# Patient Record
Sex: Male | Born: 1975 | Race: White | Hispanic: No | Marital: Married | Smoking: Never smoker
Health system: Southern US, Community
[De-identification: ages and names within clinical notes are randomized; demographics above are authoritative.]

## PROBLEM LIST (undated history)

## (undated) DIAGNOSIS — Z872 Personal history of diseases of the skin and subcutaneous tissue: Secondary | ICD-10-CM

## (undated) DIAGNOSIS — E118 Type 2 diabetes mellitus with unspecified complications: Secondary | ICD-10-CM

## (undated) DIAGNOSIS — Z9989 Dependence on other enabling machines and devices: Secondary | ICD-10-CM

## (undated) DIAGNOSIS — E669 Obesity, unspecified: Secondary | ICD-10-CM

## (undated) DIAGNOSIS — G4733 Obstructive sleep apnea (adult) (pediatric): Secondary | ICD-10-CM

## (undated) DIAGNOSIS — E785 Hyperlipidemia, unspecified: Secondary | ICD-10-CM

## (undated) DIAGNOSIS — I639 Cerebral infarction, unspecified: Secondary | ICD-10-CM

## (undated) DIAGNOSIS — G4730 Sleep apnea, unspecified: Secondary | ICD-10-CM

## (undated) DIAGNOSIS — E119 Type 2 diabetes mellitus without complications: Secondary | ICD-10-CM

## (undated) HISTORY — DX: Personal history of diseases of the skin and subcutaneous tissue: Z87.2

## (undated) HISTORY — DX: Obstructive sleep apnea (adult) (pediatric): G47.33

## (undated) HISTORY — DX: Obesity, unspecified: E66.9

## (undated) HISTORY — DX: Sleep apnea, unspecified: G47.30

## (undated) HISTORY — DX: Type 2 diabetes mellitus with unspecified complications: E11.8

## (undated) HISTORY — DX: Obstructive sleep apnea (adult) (pediatric): Z99.89

## (undated) HISTORY — DX: Type 2 diabetes mellitus without complications: E11.9

## (undated) HISTORY — DX: Hyperlipidemia, unspecified: E78.5

## (undated) HISTORY — DX: Cerebral infarction, unspecified: I63.9

---

## 1998-04-08 ENCOUNTER — Encounter: Payer: Self-pay | Admitting: Gastroenterology

## 1998-09-19 ENCOUNTER — Encounter: Admission: RE | Admit: 1998-09-19 | Discharge: 1998-10-11 | Payer: Self-pay | Admitting: Internal Medicine

## 2003-08-11 HISTORY — PX: OTHER SURGICAL HISTORY: SHX169

## 2003-09-06 ENCOUNTER — Encounter (INDEPENDENT_AMBULATORY_CARE_PROVIDER_SITE_OTHER): Payer: Self-pay | Admitting: *Deleted

## 2003-09-06 ENCOUNTER — Ambulatory Visit (HOSPITAL_BASED_OUTPATIENT_CLINIC_OR_DEPARTMENT_OTHER): Admission: RE | Admit: 2003-09-06 | Discharge: 2003-09-06 | Payer: Self-pay | Admitting: Otolaryngology

## 2003-09-06 ENCOUNTER — Ambulatory Visit (HOSPITAL_COMMUNITY): Admission: RE | Admit: 2003-09-06 | Discharge: 2003-09-06 | Payer: Self-pay | Admitting: Otolaryngology

## 2004-06-13 ENCOUNTER — Ambulatory Visit: Payer: Self-pay | Admitting: Family Medicine

## 2004-06-19 ENCOUNTER — Ambulatory Visit: Payer: Self-pay | Admitting: Family Medicine

## 2005-05-07 ENCOUNTER — Ambulatory Visit: Payer: Self-pay | Admitting: Family Medicine

## 2005-05-16 ENCOUNTER — Ambulatory Visit: Payer: Self-pay | Admitting: Family Medicine

## 2005-10-10 ENCOUNTER — Ambulatory Visit: Payer: Self-pay | Admitting: Family Medicine

## 2005-10-11 ENCOUNTER — Ambulatory Visit: Payer: Self-pay | Admitting: Family Medicine

## 2005-10-16 ENCOUNTER — Ambulatory Visit: Payer: Self-pay | Admitting: Cardiology

## 2005-10-23 ENCOUNTER — Ambulatory Visit: Payer: Self-pay

## 2006-05-03 ENCOUNTER — Ambulatory Visit: Payer: Self-pay | Admitting: Family Medicine

## 2006-05-06 ENCOUNTER — Ambulatory Visit: Payer: Self-pay

## 2006-08-16 ENCOUNTER — Ambulatory Visit: Payer: Self-pay | Admitting: Family Medicine

## 2006-08-16 DIAGNOSIS — R5383 Other fatigue: Secondary | ICD-10-CM

## 2006-08-16 DIAGNOSIS — R5381 Other malaise: Secondary | ICD-10-CM

## 2006-08-19 LAB — CONVERTED CEMR LAB
Alkaline Phosphatase: 63 units/L (ref 39–117)
BUN: 13 mg/dL (ref 6–23)
Basophils Absolute: 0 10*3/uL (ref 0.0–0.1)
Bilirubin, Direct: 0.1 mg/dL (ref 0.0–0.3)
CO2: 30 meq/L (ref 19–32)
Cholesterol: 271 mg/dL (ref 0–200)
Eosinophils Absolute: 0.1 10*3/uL (ref 0.0–0.6)
GFR calc Af Amer: 145 mL/min
GFR calc non Af Amer: 120 mL/min
HCT: 48.5 % (ref 39.0–52.0)
HDL: 37.1 mg/dL — ABNORMAL LOW (ref >39.0)
MCHC: 34.9 g/dL (ref 30.0–36.0)
MCV: 95.5 fL (ref 78.0–100.0)
Monocytes Relative: 4.8 % (ref 3.0–11.0)
Neutrophils Relative %: 60.5 % (ref 43.0–77.0)
Platelets: 262 10*3/uL (ref 150–400)
Potassium: 4.3 meq/L (ref 3.5–5.1)
RBC: 5.08 M/uL (ref 4.22–5.81)
RDW: 11.9 % (ref 11.5–14.6)
TSH: 2.13 microintl units/mL (ref 0.35–5.50)
Total CHOL/HDL Ratio: 7.3
Total Protein: 7.2 g/dL (ref 6.0–8.3)
Triglycerides: 198 mg/dL — ABNORMAL HIGH (ref 0–149)
Vitamin B-12: 378 pg/mL (ref 211–911)

## 2006-08-22 ENCOUNTER — Ambulatory Visit: Payer: Self-pay | Admitting: Family Medicine

## 2006-08-22 DIAGNOSIS — E7800 Pure hypercholesterolemia, unspecified: Secondary | ICD-10-CM

## 2006-08-22 DIAGNOSIS — E1169 Type 2 diabetes mellitus with other specified complication: Secondary | ICD-10-CM

## 2006-08-22 HISTORY — DX: Type 2 diabetes mellitus with other specified complication: E11.69

## 2006-11-18 ENCOUNTER — Ambulatory Visit: Payer: Self-pay | Admitting: Family Medicine

## 2006-11-20 LAB — CONVERTED CEMR LAB
Cholesterol: 190 mg/dL (ref 0–200)
HDL: 33.5 mg/dL — ABNORMAL LOW (ref >39.0)

## 2006-11-22 ENCOUNTER — Telehealth (INDEPENDENT_AMBULATORY_CARE_PROVIDER_SITE_OTHER): Payer: Self-pay | Admitting: Internal Medicine

## 2007-01-24 ENCOUNTER — Ambulatory Visit: Payer: Self-pay | Admitting: Family Medicine

## 2007-01-24 DIAGNOSIS — E669 Obesity, unspecified: Secondary | ICD-10-CM | POA: Insufficient documentation

## 2007-04-02 ENCOUNTER — Ambulatory Visit: Payer: Self-pay | Admitting: Family Medicine

## 2007-04-14 ENCOUNTER — Ambulatory Visit: Payer: Self-pay | Admitting: Family Medicine

## 2007-04-14 DIAGNOSIS — G4730 Sleep apnea, unspecified: Secondary | ICD-10-CM | POA: Insufficient documentation

## 2007-04-24 ENCOUNTER — Ambulatory Visit: Payer: Self-pay | Admitting: Pulmonary Disease

## 2007-05-10 ENCOUNTER — Ambulatory Visit (HOSPITAL_BASED_OUTPATIENT_CLINIC_OR_DEPARTMENT_OTHER): Admission: RE | Admit: 2007-05-10 | Discharge: 2007-05-10 | Payer: Self-pay | Admitting: Pulmonary Disease

## 2007-05-10 ENCOUNTER — Encounter: Payer: Self-pay | Admitting: Pulmonary Disease

## 2007-05-16 ENCOUNTER — Ambulatory Visit: Payer: Self-pay | Admitting: Pulmonary Disease

## 2007-05-21 ENCOUNTER — Telehealth (INDEPENDENT_AMBULATORY_CARE_PROVIDER_SITE_OTHER): Payer: Self-pay | Admitting: *Deleted

## 2007-05-28 ENCOUNTER — Ambulatory Visit: Payer: Self-pay | Admitting: Pulmonary Disease

## 2007-06-27 ENCOUNTER — Ambulatory Visit: Payer: Self-pay | Admitting: Pulmonary Disease

## 2007-08-02 ENCOUNTER — Encounter: Payer: Self-pay | Admitting: Pulmonary Disease

## 2007-09-23 ENCOUNTER — Ambulatory Visit: Payer: Self-pay | Admitting: Family Medicine

## 2007-09-29 ENCOUNTER — Ambulatory Visit: Payer: Self-pay | Admitting: Internal Medicine

## 2007-09-30 LAB — CONVERTED CEMR LAB
BUN: 16 mg/dL (ref 6–23)
Calcium: 9.1 mg/dL (ref 8.4–10.5)
Cholesterol: 146 mg/dL (ref 0–200)
Creatinine, Ser: 0.9 mg/dL (ref 0.4–1.5)
GFR calc Af Amer: 125 mL/min
GFR calc non Af Amer: 103 mL/min
LDL Cholesterol: 99 mg/dL (ref 0–99)
Total CHOL/HDL Ratio: 4.7
Triglycerides: 81 mg/dL (ref 0–149)
VLDL: 16 mg/dL (ref 0–40)

## 2007-10-01 ENCOUNTER — Ambulatory Visit: Payer: Self-pay | Admitting: Family Medicine

## 2007-10-20 ENCOUNTER — Telehealth (INDEPENDENT_AMBULATORY_CARE_PROVIDER_SITE_OTHER): Payer: Self-pay | Admitting: Internal Medicine

## 2007-11-10 ENCOUNTER — Encounter (INDEPENDENT_AMBULATORY_CARE_PROVIDER_SITE_OTHER): Payer: Self-pay | Admitting: Internal Medicine

## 2007-12-09 ENCOUNTER — Ambulatory Visit: Payer: Self-pay | Admitting: Family Medicine

## 2007-12-10 ENCOUNTER — Encounter: Payer: Self-pay | Admitting: Family Medicine

## 2007-12-17 ENCOUNTER — Telehealth: Payer: Self-pay | Admitting: Internal Medicine

## 2007-12-18 ENCOUNTER — Ambulatory Visit: Payer: Self-pay | Admitting: Family Medicine

## 2007-12-26 ENCOUNTER — Ambulatory Visit: Payer: Self-pay | Admitting: Pulmonary Disease

## 2008-01-02 ENCOUNTER — Ambulatory Visit: Payer: Self-pay | Admitting: Family Medicine

## 2008-01-02 ENCOUNTER — Encounter (INDEPENDENT_AMBULATORY_CARE_PROVIDER_SITE_OTHER): Payer: Self-pay | Admitting: Internal Medicine

## 2008-05-25 ENCOUNTER — Ambulatory Visit: Payer: Self-pay | Admitting: Internal Medicine

## 2008-05-26 ENCOUNTER — Telehealth (INDEPENDENT_AMBULATORY_CARE_PROVIDER_SITE_OTHER): Payer: Self-pay | Admitting: Internal Medicine

## 2008-05-27 ENCOUNTER — Encounter (INDEPENDENT_AMBULATORY_CARE_PROVIDER_SITE_OTHER): Payer: Self-pay | Admitting: Internal Medicine

## 2008-05-31 ENCOUNTER — Telehealth (INDEPENDENT_AMBULATORY_CARE_PROVIDER_SITE_OTHER): Payer: Self-pay | Admitting: Internal Medicine

## 2008-05-31 LAB — CONVERTED CEMR LAB
BUN: 17 mg/dL (ref 6–23)
Calcium: 9.1 mg/dL (ref 8.4–10.5)
Creatinine, Ser: 1 mg/dL (ref 0.4–1.5)
GFR calc non Af Amer: 91.08 mL/min (ref >60)
Glucose, Bld: 132 mg/dL — ABNORMAL HIGH (ref 70–99)
HDL: 34.4 mg/dL — ABNORMAL LOW (ref >39.00)
LDL Cholesterol: 117 mg/dL — ABNORMAL HIGH (ref 0–99)
Potassium: 4.4 meq/L (ref 3.5–5.1)
Total CHOL/HDL Ratio: 5
Triglycerides: 103 mg/dL (ref 0.0–149.0)
VLDL: 20.6 mg/dL (ref 0.0–40.0)

## 2008-06-04 DIAGNOSIS — H1045 Other chronic allergic conjunctivitis: Secondary | ICD-10-CM | POA: Insufficient documentation

## 2008-07-07 ENCOUNTER — Ambulatory Visit: Payer: Self-pay | Admitting: Family Medicine

## 2008-07-16 ENCOUNTER — Ambulatory Visit: Payer: Self-pay | Admitting: Family Medicine

## 2008-08-05 ENCOUNTER — Ambulatory Visit: Payer: Self-pay | Admitting: Family Medicine

## 2008-08-06 LAB — CONVERTED CEMR LAB
ALT: 28 units/L (ref 0–53)
HDL: 38.2 mg/dL — ABNORMAL LOW (ref >39.00)
Total CHOL/HDL Ratio: 3

## 2008-08-10 ENCOUNTER — Ambulatory Visit: Payer: Self-pay | Admitting: Gastroenterology

## 2008-12-24 ENCOUNTER — Ambulatory Visit: Payer: Self-pay | Admitting: Pulmonary Disease

## 2009-02-16 ENCOUNTER — Ambulatory Visit: Payer: Self-pay | Admitting: Family Medicine

## 2009-02-18 ENCOUNTER — Telehealth (INDEPENDENT_AMBULATORY_CARE_PROVIDER_SITE_OTHER): Payer: Self-pay | Admitting: Internal Medicine

## 2009-03-01 ENCOUNTER — Telehealth (INDEPENDENT_AMBULATORY_CARE_PROVIDER_SITE_OTHER): Payer: Self-pay | Admitting: Internal Medicine

## 2009-04-25 ENCOUNTER — Ambulatory Visit: Payer: Self-pay | Admitting: Family Medicine

## 2009-04-25 LAB — CONVERTED CEMR LAB: Cholesterol, target level: 200 mg/dL

## 2009-04-27 DIAGNOSIS — E1165 Type 2 diabetes mellitus with hyperglycemia: Secondary | ICD-10-CM

## 2009-04-27 DIAGNOSIS — Z794 Long term (current) use of insulin: Secondary | ICD-10-CM | POA: Insufficient documentation

## 2009-04-27 DIAGNOSIS — E1159 Type 2 diabetes mellitus with other circulatory complications: Secondary | ICD-10-CM | POA: Insufficient documentation

## 2009-04-27 LAB — CONVERTED CEMR LAB
BUN: 20 mg/dL (ref 6–23)
Basophils Absolute: 0.3 10*3/uL — ABNORMAL HIGH (ref 0.0–0.1)
Creatinine,U: 228 mg/dL
Direct LDL: 86 mg/dL
Eosinophils Absolute: 0.1 10*3/uL (ref 0.0–0.7)
GFR calc non Af Amer: 90.58 mL/min (ref >60)
Glucose, Bld: 80 mg/dL (ref 70–99)
HCT: 45.4 % (ref 39.0–52.0)
Lymphs Abs: 2.4 10*3/uL (ref 0.7–4.0)
MCV: 101.9 fL — ABNORMAL HIGH (ref 78.0–100.0)
Microalb, Ur: 0.7 mg/dL (ref 0.0–1.9)
Monocytes Absolute: 0.3 10*3/uL (ref 0.1–1.0)
Platelets: 211 10*3/uL (ref 150.0–400.0)
Potassium: 4.4 meq/L (ref 3.5–5.1)
RDW: 12.2 % (ref 11.5–14.6)
TSH: 2.52 microintl units/mL (ref 0.35–5.50)
Total Bilirubin: 0.7 mg/dL (ref 0.3–1.2)

## 2009-05-18 ENCOUNTER — Encounter: Payer: Self-pay | Admitting: Family Medicine

## 2009-07-27 ENCOUNTER — Ambulatory Visit: Payer: Self-pay | Admitting: Family Medicine

## 2009-07-27 DIAGNOSIS — R002 Palpitations: Secondary | ICD-10-CM | POA: Insufficient documentation

## 2009-09-01 ENCOUNTER — Ambulatory Visit: Payer: Self-pay | Admitting: Cardiology

## 2009-09-01 ENCOUNTER — Ambulatory Visit: Payer: Self-pay

## 2009-11-03 ENCOUNTER — Ambulatory Visit: Payer: Self-pay | Admitting: Family Medicine

## 2009-12-16 ENCOUNTER — Ambulatory Visit: Payer: Self-pay | Admitting: Internal Medicine

## 2009-12-16 DIAGNOSIS — N453 Epididymo-orchitis: Secondary | ICD-10-CM | POA: Insufficient documentation

## 2009-12-16 LAB — CONVERTED CEMR LAB
Nitrite: NEGATIVE
Urobilinogen, UA: 0.2

## 2009-12-23 ENCOUNTER — Telehealth: Payer: Self-pay | Admitting: Family Medicine

## 2009-12-23 ENCOUNTER — Encounter: Admission: RE | Admit: 2009-12-23 | Discharge: 2009-12-23 | Payer: Self-pay | Admitting: Family Medicine

## 2009-12-23 ENCOUNTER — Ambulatory Visit: Payer: Self-pay | Admitting: Family Medicine

## 2009-12-23 DIAGNOSIS — K921 Melena: Secondary | ICD-10-CM

## 2010-04-10 ENCOUNTER — Ambulatory Visit
Admission: RE | Admit: 2010-04-10 | Discharge: 2010-04-10 | Payer: Self-pay | Source: Home / Self Care | Attending: Family Medicine | Admitting: Family Medicine

## 2010-04-10 DIAGNOSIS — J069 Acute upper respiratory infection, unspecified: Secondary | ICD-10-CM | POA: Insufficient documentation

## 2010-04-11 NOTE — Assessment & Plan Note (Signed)
Summary: ESTAB FROM [facility] AND MED REFILL/DLO   Vital Signs:  Patient profile:   35 year old male Height:      63 inches Weight:      188.8 pounds BMI:     33.57 Temp:     98.1 degrees F oral Pulse rate:   84 / minute Pulse rhythm:   regular BP sitting:   100 / 78  (left arm) Cuff size:   regular  Vitals Entered By: Benny Lennert CMA Duncan Dull) (April 25, 2009 4:01 PM)  History of Present Illness: Chief complaint est from [facility] needs med refills  DM  Chol    check tsh  Diabetes Management History:      The patient is a 35 years old male who comes in for evaluation of DM Type 2.  He has not been enrolled in the "Diabetic Education Program".  He states understanding of dietary principles but he is not following the appropriate diet.  No sensory loss is reported.  He is checking home blood sugars.  He says that he is not exercising regularly.        Hypoglycemic symptoms are not occurring.  No hyperglycemic symptoms are reported.        There are no symptoms to suggest diabetic complications.  No changes have been made to his treatment plan since last visit.  Dietary compliance is reported to be fair.    Lipid Management History:      Positive NCEP/ATP III risk factors include diabetes and HDL cholesterol less than 40.  Negative NCEP/ATP III risk factors include male age less than 23 years old, non-tobacco-user status, non-hypertensive, no ASHD (atherosclerotic heart disease), no prior stroke/TIA, no peripheral vascular disease, and no history of aortic aneurysm.        The patient states that he knows about the "Therapeutic Lifestyle Change" diet.  His compliance with the TLC diet is fair.    Preventive Screening-Counseling & Management  Caffeine-Diet-Exercise     Does Patient Exercise: no  Current Problems (verified): 1)  Diabetes Mellitus, Type II  (ICD-250.00) 2)  Conjunctivitis, Allergic  (ICD-372.14) 3)  Sleep Apnea  (ICD-780.57) 4)  Obesity  (ICD-278.00) 5)   Hypercholesterolemia, Pure  (ICD-272.0) 6)  Fatigue  (ICD-780.79) 7)  Well Adult  (ICD-V70.0)  Allergies: 1)  ! Sulfa 2)  ! Sudafed 3)  ! Metformin Hcl 4)  ! Septra  Past History:  Past medical, surgical, family and social histories (including risk factors) reviewed, and no changes noted (except as noted below).  Past Medical History: dyslipidemia history of rosacea  SLEEP APNEA (ICD-780.57) OBESITY (ICD-278.00) Diabetes mellitus, type II  Past Surgical History: Reviewed history from 06/04/2008 and no changes required. nasoplasty 08/2003  Family History: Reviewed history from 08/10/2008 and no changes required. history of heart disease in his mother and father No FH of Colon Cancer:  Social History: Reviewed history from 08/10/2008 and no changes required. 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  Alcohol Use - yes  occasional Daily Caffeine Use  2-3 cups per day Does Patient Exercise:  no  Review of Systems       REVIEW OF SYSTEMS GEN: No acute illnesses, no fever, chills, sweats. CV: No chest pain or SOB GI: No noted N or V Otherwise, pertinent positives and negatives are noted in the HPI.   Physical Exam  Additional Exam:  GEN: WDWN, NAD,  Non-toxic, A & O x 3 HEENT: Atraumatic, Normocephalic. Neck supple. No masses, No LAD. Ears and Nose: No external deformity. CV: RRR, No M/G/R. No JVD. No thrill. No extra heart sounds. PULM: CTA B, no wheezes, crackles, rhonchi. No retractions. No resp. distress. No accessory muscle use. EXTR: No c/c/e NEURO: Normal gait.  PSYCH: Normally interactive. Conversant. Not depressed or anxious appearing.  Calm demeanor.    Diabetes Management Exam:    Foot Exam (with socks and/or shoes not present):       Sensory-Pinprick/Light touch:          Left medial foot (L-4):  normal          Left dorsal foot (L-5): normal          Left lateral foot (S-1): normal          Right medial foot (L-4): normal          Right dorsal foot (L-5): normal          Right lateral foot (S-1): normal       Sensory-Monofilament:          Left foot: normal          Right foot: normal       Inspection:          Left foot: normal          Right foot: normal       Nails:          Left foot: normal          Right foot: normal   Impression & Recommendations:  Problem # 1:  DIABETES MELLITUS, TYPE II (ICD-250.00)  His updated medication list for this problem includes:    Adult Aspirin Low Strength 81 Mg Tbdp (Aspirin) .Marland Kitchen... 1 by mouth daily    Actos 30 Mg Tabs (Pioglitazone hcl) .Marland Kitchen... 1 once daily for diabetes controll by mouth  Labs Reviewed: Creat: 1.0 (05/25/2008)    Reviewed HgBA1c results: 5.8 (08/05/2008)  6.5 (05/25/2008)  Problem # 2:  HYPERCHOLESTEROLEMIA, PURE (ICD-272.0)  His updated medication list for this problem includes:    Simvastatin 40 Mg Tabs (Simvastatin) .Marland Kitchen... 1 each day for elevated cholesterol  Orders: Venipuncture ([address]) TLB-Cholesterol, Direct LDL (83721-DIRLDL)  Labs Reviewed: SGOT: 20 (08/05/2008)   SGPT: 28 (08/05/2008)  Lipid Goals: Chol Goal: 200 (04/25/2009)   HDL Goal: 40 (04/25/2009)   LDL Goal: 100 (04/25/2009)   TG Goal: 150 (04/25/2009)  10 Yr Risk Heart Disease: 2 %   HDL:38.20 (08/05/2008), 34.40 (05/25/2008)  LDL:67 (08/05/2008), 117 (60/45/4098)  Chol:118 (08/05/2008), 172 (05/25/2008)  Trig:62.0 (08/05/2008), 103.0 (05/25/2008)  Complete Medication List: 1)  Adult Aspirin Low Strength 81 Mg Tbdp (Aspirin) .Marland Kitchen.. 1 by mouth daily 2)  Freestyle Test Strp (Glucose blood) .... Check twice a day  until blood sugar stablizied 3)  Simvastatin 40 Mg Tabs (Simvastatin) .Marland Kitchen.. 1 each day for elevated cholesterol 4)  Actos 30 Mg Tabs (Pioglitazone hcl) .Marland Kitchen.. 1 once daily for diabetes controll by mouth 5)  Metrogel 1 % Gel  (Metronidazole) .... Use as directed  Other Orders: TLB-BMP (Basic Metabolic Panel-BMET) (80048-METABOL) TLB-CBC Platelet - w/Differential (85025-CBCD) TLB-Hepatic/Liver Function Pnl (80076-HEPATIC) TLB-TSH (Thyroid Stimulating Hormone) (84443-TSH) TLB-A1C / Hgb A1C (Glycohemoglobin) (83036-A1C) TLB-Microalbumin/Creat Ratio, Urine (82043-MALB)  Diabetes Management Assessment/Plan:      The following lipid goals have been established for the patient: Total cholesterol goal of 200; LDL cholesterol goal of 100; HDL cholesterol goal of 40; Triglyceride goal of 150.  Lipid Assessment/Plan:      Based on NCEP/ATP III, the patient's risk factor category is "history of diabetes".  From this information, the patient's calculated lipid goals are as follows: Total cholesterol goal is 200; LDL cholesterol goal is 100; HDL cholesterol goal is 40; Triglyceride goal is 150.    Patient Instructions: 1)  f/u 6 months 2)  cpx, 30 min 3)  HgbA1c = 250.00 several days before Prescriptions: ACTOS 30 MG TABS (PIOGLITAZONE HCL) 1 once daily for diabetes controll by mouth  #30 x 11   Entered and Authorized by:   Hannah Beat MD   Signed by:   Hannah Beat MD on 04/25/2009   Method used:   [facility]        [facility] [id number]       [address]       [place], [place]  [address]       Ph: [phone] or [phone]       Fax: [phone]   RxID:   [id number] SIMVASTATIN 40 MG TABS (SIMVASTATIN) 1 each day for elevated cholesterol  #30 Tablet x 11   Entered and Authorized by:   Hannah Beat MD   Signed by:   Hannah Beat MD on 04/25/2009   Method used:   [facility]        [facility] [id number]       [address]       [place], [place]  [address]       Ph: [phone] or [phone]       Fax: [phone]   RxID:   [id number] FREESTYLE TEST  STRP (GLUCOSE BLOOD) CHECK TWICE A DAY  UNTIL BLOOD SUGAR STABLIZIED  #100 x 11    Entered and Authorized by:   Hannah Beat MD   Signed by:   Hannah Beat MD on 04/25/2009   Method used:   [facility]        [facility] (retail)       [address]       [place], [place]  [address]       Ph: [phone] or [phone]       Fax: [phone]   RxID:   [id number]   Current Allergies (reviewed today): ! SULFA ! SUDAFED ! METFORMIN HCL ! SEPTRA

## 2010-04-11 NOTE — Assessment & Plan Note (Signed)
Summary: UTI/DLO   Vital Signs:  Patient profile:   35 year old male Weight:      203.25 pounds Temp:     98.4 degrees F oral Pulse rate:   72 / minute Pulse rhythm:   regular BP sitting:   122 / 80  (left arm) Cuff size:   large  Vitals Entered By: Selena Batten Dance CMA Duncan Dull) (December 16, 2009 9:32 AM) CC: Epididymitis vs UTI   History of Present Illness: CC: ? epididymitis vs UTI  Pt with h/o T2DM.  Last weekend at beach with L testicular pain, swelling.  Also with dysuria, frequency, urgency.  Seen at [facility], dx with epididymitis and treated with cipro and 1gm azithro.  felt better for 2 days, swelling decreased, now sxs seem to be returning.  Swelling remains down.  Currently dull achey 2-3/10 L testicle.  Previously up to 4-5/10.  UCx at [facility] without growth.  No recent bike or motorcycle rides.  No h/o STIs, currently sexually active in monogamous relationship.  No current dysuria, no frequency, no urgency, stream fine.  No abd pain, n/v/f/c.    + h/o L abd pain 10 years ago s/p colonoscopy, normal.  Current Medications (verified): 1)  Adult Aspirin Low Strength 81 Mg  Tbdp (Aspirin) .Marland Kitchen.. 1 By Mouth Daily 2)  Freestyle Lite Test  Strp (Glucose Blood) .... Check Blood Sugar Twice A Day Until Stabilized 3)  Simvastatin 40 Mg Tabs (Simvastatin) .Marland Kitchen.. 1 Each Day For Elevated Cholesterol 4)  Actos 30 Mg Tabs (Pioglitazone Hcl) .Marland Kitchen.. 1 Once Daily For Diabetes Controll By Mouth 5)  Metrogel 1 % Gel (Metronidazole) .... Use As Directed 6)  Cipro 500 Mg Tabs (Ciprofloxacin Hcl) .... As Directed  Allergies: 1)  ! Sulfa 2)  ! Sudafed 3)  ! Metformin Hcl 4)  ! 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  Alcohol Use - yes  occasional Daily Caffeine Use  2-3 cups per day PMH-FH-SH reviewed for relevance  Review of Systems       per HPI  Physical Exam  General:  Well-developed,well-nourished,in no acute distress; alert,appropriate and cooperative throughout examination Abdomen:  Bowel sounds positive,abdomen soft and non-tender without masses, organomegaly or hernias noted.  no CVA tenderness Genitalia:  Testes bilaterally descended without nodularity, tenderness or masses. slight epididymal swelling L side, no tenderness to palpation.  No penis lesions or urethral discharge.   Impression & Recommendations:  Problem # 1:  EPIDIDYMITIS, LEFT (ICD-604.90) UA clear today.  ? if just needs more time to get back to normal.  rec complete cipro course.  if not better by next week, consider testicular ultrasound to eval spermatocele/hydrocele.  if worse, to seek care sooner.  also recommend NSAIDs.  Complete Medication List: 1)  Adult Aspirin Low Strength 81 Mg Tbdp (Aspirin) .Marland Kitchen.. 1 by mouth daily 2)  Freestyle Lite Test Strp (Glucose blood) .... Check blood sugar twice a day until stabilized 3)  Simvastatin 40 Mg Tabs (Simvastatin) .Marland Kitchen.. 1 each day for elevated cholesterol 4)  Actos 30 Mg Tabs (Pioglitazone hcl) .Marland Kitchen.. 1 once daily for diabetes controll by mouth 5)  Metrogel 1 % Gel (Metronidazole) .... Use as directed 6)  Cipro 500 Mg  Tabs (Ciprofloxacin hcl) .... As directed  Patient Instructions: 1)  Finish course of antibiotic.  if better by weekend, we will just watch for now.  If not improving or worsening, we may treat you with another course of antibiotics.   2)  Give [place] a call on Monday with an update. 3)  Good to meet you today.  Call clinic with questions.  Current Allergies (reviewed today): ! SULFA ! SUDAFED ! METFORMIN HCL ! SEPTRA  Laboratory Results   Urine Tests  Date/Time  Received: December 16, 2009 9:40 AM Date/Time Reported: December 16, 2009 9:40 AM  Routine Urinalysis   Color: yellow Appearance: Clear Glucose: negative   (Normal Range: Negative) Bilirubin: negative   (Normal Range: Negative) Ketone: negative   (Normal Range: Negative) Spec. Gravity: 1.025   (Normal Range: 1.003-1.035) Blood: negative   (Normal Range: Negative) pH: 6.0   (Normal Range: 5.0-8.0) Protein: negative   (Normal Range: Negative) Urobilinogen: 0.2   (Normal Range: 0-1) Nitrite: negative   (Normal Range: Negative) Leukocyte Esterace: negative   (Normal Range: Negative)

## 2010-04-11 NOTE — Miscellaneous (Signed)
Summary: med list update/ test strips called in  Clinical Lists Changes  Medications: Changed medication from FREESTYLE TEST  STRP (GLUCOSE BLOOD) CHECK TWICE A DAY  UNTIL BLOOD SUGAR STABLIZIED to FREESTYLE LITE TEST  STRP (GLUCOSE BLOOD) Check blood sugar twice a day until stabilized - Signed Rx of FREESTYLE LITE TEST  STRP (GLUCOSE BLOOD) Check blood sugar twice a day until stabilized;  #100 x 3;  Signed;  Entered by: Lowella Petties CMA;  Authorized by: Hannah Beat MD;  Method used: [facility], [place], [place]  [address], Ph: [phone] or [phone], Fax: [phone]    Prescriptions: FREESTYLE LITE TEST  STRP (GLUCOSE BLOOD) Check blood sugar twice a day until stabilized  #100 x 3   Entered by:   Lowella Petties CMA   Authorized by:   Hannah Beat MD   Signed by:   Lowella Petties CMA on 05/18/2009   Method used:   [facility]        [facility] [id number]       [address]       [place], [place]  [address]       Ph: [phone] or [phone]       Fax: [phone]   RxID:   [id number]   Prior Medications: ADULT ASPIRIN LOW STRENGTH 81 MG  TBDP (ASPIRIN) 1 by mouth daily SIMVASTATIN 40 MG TABS (SIMVASTATIN) 1 each day for elevated cholesterol ACTOS 30 MG TABS (PIOGLITAZONE HCL) 1 once daily for diabetes controll by mouth METROGEL 1 % GEL (METRONIDAZOLE) use as directed Current Allergies: ! SULFA ! SUDAFED ! METFORMIN HCL ! SEPTRA

## 2010-04-11 NOTE — Assessment & Plan Note (Signed)
Summary: TETENUS/Dama Hedgepeth/CLE  Nurse Visit   Allergies: 1)  ! Sulfa 2)  ! Sudafed 3)  ! Metformin Hcl 4)  ! Septra  Immunizations Administered:  Tetanus Vaccine:    Vaccine Type: Tdap    Site: right deltoid    Mfr: GlaxoSmithKline    Dose: 0.5 ml    Route: IM    Given by: Linde Gillis CMA (AAMA)    Exp. Date: 12/30/2011    Lot #: [id number]    VIS given: 01/28/07 version given November 03, 2009.  Orders Added: 1)  Tdap => 18yrs IM [90715] 2)  Admin 1st Vaccine [56433]

## 2010-04-11 NOTE — Assessment & Plan Note (Signed)
Summary: 3:00  ST,EAR,COUGH/CLE   Vital Signs:  Patient profile:   35 year old male Height:      63 inches Weight:      189.8 pounds BMI:     33.74 Temp:     98.3 degrees F oral Pulse rate:   84 / minute Pulse rhythm:   regular BP sitting:   112 / 80  (left arm) Cuff size:   regular  Vitals Entered By: Benny Lennert CMA Duncan Dull) (Jul 27, 2009 2:57 PM)  History of Present Illness: Chief complaint sore throat,ears, cough  35 year old male:  1. URI: Fever last friday last thursday - mainly with sore throat, then friday monning started to run a fever fever broke on saturday morning coughing and has kept a cough.   2. palpitations: intermittently has had a racing heart beat that will last 10 minutes or so. Has not happened in the last 3 weeks, but was happeneing a few times a week. No active CP or SOB.         Allergies: 1)  ! Sulfa 2)  ! Sudafed 3)  ! Metformin Hcl 4)  ! Septra  Past History:  Past medical, surgical, family and social histories (including risk factors) reviewed, and no changes noted (except as noted below).  Past Medical History: Reviewed history from 04/25/2009 and no changes required. dyslipidemia history of rosacea  SLEEP APNEA (ICD-780.57) OBESITY (ICD-278.00) Diabetes mellitus, type II  Past Surgical History: Reviewed history from 06/04/2008 and no changes required. nasoplasty 08/2003  Family History: Reviewed history from 08/10/2008 and no changes required. history of heart disease in his mother and father No FH of Colon Cancer:  Social History: Reviewed history from 08/10/2008 and no changes required. 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  Alcohol Use - yes  occasional Daily Caffeine Use  2-3 cups per day  Review of Systems      See HPI General:  Complains of fever;  denies chills and sweats.  Physical Exam  Additional Exam:  GEN: WDWN, NAD; alert,appropriate and cooperative throughout exam HEENT: Normocephalic and atraumatic. Throat clear, w/o exudate, no LAD, R TM clear, L TM - good landmarks, No fluid present. rhinnorhea.  Left frontal and maxillary sinuses: NT Right frontal and maxillary sinuses: NT NECK: No ant or post LAD CV: RRR, No M/G/R PULM: no resp distress, no accessory muscles.  No retractions. no w/c/r ABD: S,NT,ND,+BS, No HSM EXTR: no c/c/e PSYCH: full affect, pleasant, conversant    Impression & Recommendations:  Problem # 1:  URI (ICD-465.9) Assessment New  His updated medication list for this problem includes:    Adult Aspirin Low Strength 81 Mg Tbdp (Aspirin) .Marland Kitchen... 1 by mouth daily  Instructed on symptomatic treatment. Call if symptoms persist or worsen.   Problem # 2:  PALPITATIONS (ICD-785.1) Assessment: New DIscussed that with rare occurrence, likelihood of capture small on EKG, holter.  Strong FH of CAD, father with multiple interventions, multiple people on both sides of family < 50 with CAD. Patient with dyslipidemia, DM 2, obesity. Will obtain an ETT to eval for CAD, potential inducible arrythmia, and assist with evaluation for exercise tolerance for risk assessment.  Orders: Cardiology Referral (Cardiology)  Complete Medication List: 1)  Adult Aspirin Low Strength 81 Mg Tbdp (Aspirin) .Marland Kitchen.. 1 by mouth daily 2)  Freestyle Lite Test Strp (Glucose blood) .... Check blood sugar twice  a day until stabilized 3)  Simvastatin 40 Mg Tabs (Simvastatin) .Marland Kitchen.. 1 each day for elevated cholesterol 4)  Actos 30 Mg Tabs (Pioglitazone hcl) .Marland Kitchen.. 1 once daily for diabetes controll by mouth 5)  Metrogel 1 % Gel (Metronidazole) .... Use as directed  Patient Instructions: 1)  Referral Appointment Information 2)  Day/Date: 3)  Time: 4)  Place/MD: 5)  Address: 6)  Phone/Fax: 7)  Patient given appointment information.  Information/Orders faxed/mailed.   Current Allergies (reviewed today): ! SULFA ! SUDAFED ! METFORMIN HCL ! SEPTRA

## 2010-04-11 NOTE — Progress Notes (Signed)
Summary: called report on ultrasound  Phone Note From Other Clinic   Caller: [facility] Summary of Call: Called report on scrotal ultrasound- this is already in chart. Initial call taken by: Lowella Petties CMA,  December 23, 2009 3:09 PM  Follow-up for Phone Call        dr Ermalene Searing already addressed.Consuello Masse CMA   Follow-up by: Benny Lennert CMA Duncan Dull),  December 23, 2009 4:14 PM

## 2010-04-11 NOTE — Assessment & Plan Note (Signed)
Summary: ?UTI/CLE   Vital Signs:  Patient profile:   35 year old male Height:      63 inches Weight:      202.0 pounds BMI:     35.91 Temp:     98.6 degrees F oral Pulse rate:   72 / minute Pulse rhythm:   regular BP sitting:   120 / 86  (left arm) Cuff size:   large  Vitals Entered By: Benny Lennert CMA Duncan Dull) (December 23, 2009 12:01 PM)  History of Present Illness: Chief complaint ? UTI  Pt with h/o T2DM.  Seen by Dr. Cecille Amsterdam 1 week ago.  Felt likely epidydimitis.Marland KitchenMarland KitchenTold  to complete cipro as felt may not have been on long enough. Below is the HPI summary from that appt:  2 [place] ago  [place] beach with L testicular pain, swelling.  Also with dysuria, frequency, urgency.  Seen at [facility], dx with epididymitis and treated with cipro and 1gm azithro.  felt better for 2 days, swelling decreased, now sxs seem to be returning.  Swelling remains down.  Currently dull achey 2-3/10 L testicle.  Previously up to 4-5/10. UCx at [facility] without growth.  No recent bike or motorcycle rides.  No h/o STIs, currently sexually active in monogamous relationship.   Over the weekend..he felt it was better until did self exam..epidydimitis felt spongy/swollen. Intermittant pain in left testicle. No current dysuria, no frequency, no urgency, stream fine.  Testicle does hurt  mildly when urinating as well    No n/v/f/c.    + h/o L abd pain 10 years ago s/p colonoscopy, normal. Has appt with GI for 2 months of pain in left abdomen. Pain after large meals. Eats a lot of fat in diet. Some relief with BMs. No benefit from yomax.   Did note blood in stool on about 6 days ago after taking NSAID. Mild discomfort in rectum. No straining. Has history of intermittant constipation..but none recently  Bright red mixed in stool and in water.    Not checking CBGs.Marland Kitchenoverdue for DM appt with Dr. Patsy Lager.     Problems Prior to Update: 1)  Epididymitis, Left  (ICD-604.90) 2)  Palpitations  (ICD-785.1) 3)  Diabetes  Mellitus, Type II  (ICD-250.00) 4)  Conjunctivitis, Allergic  (ICD-372.14) 5)  Sleep Apnea  (ICD-780.57) 6)  Obesity  (ICD-278.00) 7)  Hypercholesterolemia, Pure  (ICD-272.0) 8)  Fatigue  (ICD-780.79) 9)  Well Adult  (ICD-V70.0)  Current Medications (verified): 1)  Adult Aspirin Low Strength 81 Mg  Tbdp (Aspirin) .Marland Kitchen.. 1 By Mouth Daily 2)  Freestyle Lite Test  Strp (Glucose Blood) .... Check Blood Sugar Twice A Day Until Stabilized 3)  Simvastatin 40 Mg Tabs (Simvastatin) .Marland Kitchen.. 1 Each Day For Elevated Cholesterol 4)  Actos 30 Mg Tabs (Pioglitazone Hcl) .Marland Kitchen.. 1 Once Daily For Diabetes Controll By Mouth 5)  Metrogel 1 % Gel (Metronidazole) .... Use As Directed 6)  Doxycycline Hyclate 100 Mg Caps (Doxycycline Hyclate) .... Take One Tablet 2 Times Daily For 10 Days  Allergies: 1)  ! Sulfa 2)  ! Sudafed 3)  ! Metformin Hcl 4)  ! Septra  Past History:  Past medical, surgical, family and social histories (including risk factors) reviewed, and no changes noted (except as noted below).  Past Medical History: Reviewed history from 04/25/2009 and no changes required. dyslipidemia history of rosacea  SLEEP APNEA (ICD-780.57) OBESITY (ICD-278.00) Diabetes mellitus, type II  Past Surgical History: Reviewed history from 06/04/2008 and no changes required. nasoplasty 08/2003  Family  History: Reviewed history from 08/10/2008 and no changes required. history of heart disease in his mother and father No FH of Colon Cancer:  Social History: Reviewed history from 08/10/2008 and no changes required. 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  Alcohol Use - yes  occasional Daily Caffeine Use  2-3 cups per day  Review of Systems General:  Denies fatigue and fever. CV:  Denies chest pain or discomfort. Resp:  Denies shortness of breath. GI:   Complains of abdominal pain; denies bloody stools. MS:  Denies joint pain.  Physical Exam  General:  Well-developed,well-nourished,in no acute distress; alert,appropriate and cooperative throughout examination Mouth:  Oral mucosa and oropharynx without lesions or exudates.  Teeth in good repair. Wallace Cullens PND. Neck:  no carotid bruit or thyromegaly no cervical or supraclavicular lymphadenopathy  Lungs:  Normal respiratory effort, chest expands symmetrically. Lungs are clear to auscultation, no crackles or wheezes. Heart:  Normal rate and regular rhythm. S1 and S2 normal without gallop, murmur, click, rub or other extra sounds. Abdomen:  Bowel sounds positive,abdomen soft and non-tender without masses, organomegaly or hernias noted.  no CVA tenderness Rectal:  stool positive for occult blood and internal hemorrhoid(s). ..currently oozing small blood  Genitalia:  Testes bilaterally descended without nodularity,  or masses. slight epididymal swelling L side,significantly  tenderness to palpation.  No penis lesions or urethral discharge.  No hernia Prostate:  Prostate gland firm and smooth, no enlargement, nodularity, tenderness, mass, asymmetry or induration. Skin:  Intact without suspicious lesions or rashes Psych:  Cognition and judgment appear intact. Alert and cooperative with normal attention span and concentration. No apparent delusions, illusions, hallucinations   Impression & Recommendations:  Problem # 1:  EPIDIDYMITIS, LEFT (ICD-604.90) Given return of pain... repeat course of antibiotics...eval with [place] of testes and epididimitis.  Orders: Radiology Referral (Radiology)  Problem # 2:  DIABETES MELLITUS, TYPE II (ICD-250.00) Overdue for reeval... schedule follow up with primary MD.  His updated medication list for this problem includes:    Adult Aspirin Low Strength 81 Mg Tbdp (Aspirin) .Marland Kitchen... 1 by mouth daily    Actos 30 Mg Tabs (Pioglitazone hcl) .Marland Kitchen... 1 once daily for diabetes  controll by mouth  Problem # 3:  HEMATOCHEZIA (ICD-578.1) Due to internal hemmorhoids.  Increase fiber in diet, water, exercise.  Orders: Anoscopy ([address])  Complete Medication List: 1)  Adult Aspirin Low Strength 81 Mg Tbdp (Aspirin) .Marland Kitchen.. 1 by mouth daily 2)  Freestyle Lite Test Strp (Glucose blood) .... Check blood sugar twice a day until stabilized 3)  Simvastatin 40 Mg Tabs (Simvastatin) .Marland Kitchen.. 1 each day for elevated cholesterol 4)  Actos 30 Mg Tabs (Pioglitazone hcl) .Marland Kitchen.. 1 once daily for diabetes controll by mouth 5)  Metrogel 1 % Gel (Metronidazole) .... Use as directed 6)  Doxycycline Hyclate 100 Mg Caps (Doxycycline hyclate) .... Take one tablet 2 times daily for 10 days  Patient Instructions: 1)  Schedule DM appt with Dr. Patsy Lager in next few weeks. 2)  Fasting labs prior..Dr. Patsy Lager to order.  3)  Referral Appointment Information 4)  Day/Date: 5)  Time: 6)  Place/MD: 7)  Address: 8)  Phone/Fax: 9)  Patient given appointment information. Information/Orders faxed/mailed.  10)   We will call with [place] results.  11)   Increase fiber in diet.  12)   Avoid fatty greasy foods, eat small meals. Prescriptions: DOXYCYCLINE HYCLATE 100 MG CAPS (  DOXYCYCLINE HYCLATE) take one tablet 2 times daily for 10 days  #20 x 0   Entered by:   Benny Lennert CMA (AAMA)   Authorized by:   Kerby Nora MD   Signed by:   Benny Lennert CMA (AAMA) on 12/23/2009   Method used:   Electronically to        [id number] (retail)       [address]       [place], [place]  [address]       Ph: [phone]       Fax: [phone]   RxID:   [id number]   Current Allergies (reviewed today): ! SULFA ! SUDAFED ! METFORMIN HCL ! SEPTRA

## 2010-04-19 NOTE — Assessment & Plan Note (Signed)
Summary: CONGESTION,ST,COUGH,FEVER/CLE   Vital Signs:  Patient profile:   35 year old male Height:      63 inches Weight:      205.25 pounds BMI:     36.49 Temp:     99.0 degrees F oral Pulse rate:   72 / minute Pulse rhythm:   regular BP sitting:   140 / 90  (left arm) Cuff size:   large  Vitals Entered By: Benny Lennert CMA Duncan Dull) (April 10, 2010 2:33 PM)  History of Present Illness: Chief complaint congestion sore throat ,fever and cough  Acute Visit History:      The patient complains of cough, earache, fever, headache, nasal discharge, sinus problems, and sore throat.  These symptoms began 5 days ago.  Other comments include: Uses CPAP at night.        His highest temperature has been 100.9.        The character of the cough is described as nonproductive.  There is no history of wheezing or shortness of breath associated with his cough.        He complains of sinus pressure.        Problems Prior to Update: 1)  Hematochezia  (ICD-578.1) 2)  Hematochezia  (ICD-578.1) 3)  Epididymitis, Left  (ICD-604.90) 4)  Palpitations  (ICD-785.1) 5)  Diabetes Mellitus, Type II  (ICD-250.00) 6)  Conjunctivitis, Allergic  (ICD-372.14) 7)  Sleep Apnea  (ICD-780.57) 8)  Obesity  (ICD-278.00) 9)  Hypercholesterolemia, Pure  (ICD-272.0) 10)  Fatigue  (ICD-780.79) 11)  Well Adult  (ICD-V70.0)  Current Medications (verified): 1)  Adult Aspirin Low Strength 81 Mg  Tbdp (Aspirin) .Marland Kitchen.. 1 By Mouth Daily 2)  Freestyle Lite Test  Strp (Glucose Blood) .... Check Blood Sugar Twice A Day Until Stabilized 3)  Simvastatin 40 Mg Tabs (Simvastatin) .Marland Kitchen.. 1 Each Day For Elevated Cholesterol 4)  Actos 30 Mg Tabs (Pioglitazone Hcl) .Marland Kitchen.. 1 Once Daily For Diabetes Controll By Mouth 5)  Metrogel 1 % Gel (Metronidazole) .... Use As Directed 6)  Amoxicillin 500 Mg Tabs (Amoxicillin) .... 2 Tab By Mouth Two Times A Day  X 10 Days  Void After 04/21/2010  Allergies: 1)  ! Sulfa 2)  ! Sudafed 3)  !  Metformin Hcl 4)  ! Septra  Past History:  Past medical, surgical, family and social histories (including risk factors) reviewed, and no changes noted (except as noted below).  Past Medical History: Reviewed history from 04/25/2009 and no changes required. dyslipidemia history of rosacea  SLEEP APNEA (ICD-780.57) OBESITY (ICD-278.00) Diabetes mellitus, type II  Past Surgical History: Reviewed history from 06/04/2008 and no changes required. nasoplasty 08/2003  Family History: Reviewed history from 08/10/2008 and no changes required. history of heart disease in his mother and father No FH of Colon Cancer:  Social History: Reviewed history from 08/10/2008 and no changes required. 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  Alcohol Use - yes  occasional Daily Caffeine Use  2-3 cups per day  Review of Systems General:  Denies fatigue and fever. CV:  Denies chest pain or discomfort. Resp:  Denies shortness of breath.  Physical Exam  General:  overweight appearin male In NAD  Ears:  clear fluid B TMs Nose:  nasal turbinates red, no mucosal palor  Mouth:  MMM Neck:  no carotid bruit or thyromegaly no cervical or supraclavicular lymphadenopathy  Lungs:  Normal respiratory  effort, chest expands symmetrically. Lungs are clear to auscultation, no crackles or wheezes. Heart:  Normal rate and regular rhythm. S1 and S2 normal without gallop, murmur, click, rub or other extra sounds.   Impression & Recommendations:  Problem # 1:  URI (ICD-465.9) Most likely viral infection.. vs early sinus infection. Treat symptomatically, nasal saline irrigation, mucolytic. If not improving ifn 2-3 days start oral antibitoics. His updated medication list for this problem includes:    Adult Aspirin Low Strength 81 Mg Tbdp (Aspirin) .Marland Kitchen... 1 by  mouth daily  Complete Medication List: 1)  Adult Aspirin Low Strength 81 Mg Tbdp (Aspirin) .Marland Kitchen.. 1 by mouth daily 2)  Freestyle Lite Test Strp (Glucose blood) .... Check blood sugar twice a day until stabilized 3)  Simvastatin 40 Mg Tabs (Simvastatin) .Marland Kitchen.. 1 each day for elevated cholesterol 4)  Actos 30 Mg Tabs (Pioglitazone hcl) .Marland Kitchen.. 1 once daily for diabetes controll by mouth 5)  Metrogel 1 % Gel (Metronidazole) .... Use as directed 6)  Amoxicillin 500 Mg Tabs (Amoxicillin) .... 2 tab by mouth two times a day  x 10 days  void after 04/21/2010  Patient Instructions: 1)  Add nasal saline irrigation 2-3 times a day. 2)   Start mucinex (guafenesin) no decongestant.Marland Kitchen 3)  If not improving in 2-3 days ... or fever not resolving fill antibitoics.  Prescriptions: AMOXICILLIN 500 MG TABS (AMOXICILLIN) 2 tab by mouth two times a day  x 10 days  VOID after 04/21/2010  #40 x 0   Entered and Authorized by:   Kerby Nora MD   Signed by:   Kerby Nora MD on 04/10/2010   Method used:   Print then Give to Patient   RxID:   [id number]    Orders Added: 1)  Est. Patient Level III [95621]    Current Allergies (reviewed today): ! SULFA ! SUDAFED ! METFORMIN HCL ! SEPTRA

## 2010-06-06 ENCOUNTER — Encounter: Payer: Self-pay | Admitting: Family Medicine

## 2010-06-07 ENCOUNTER — Encounter: Payer: Self-pay | Admitting: Family Medicine

## 2010-06-14 ENCOUNTER — Encounter: Payer: Self-pay | Admitting: Family Medicine

## 2010-06-14 ENCOUNTER — Ambulatory Visit (INDEPENDENT_AMBULATORY_CARE_PROVIDER_SITE_OTHER): Payer: BC Managed Care – PPO | Admitting: Family Medicine

## 2010-06-14 DIAGNOSIS — E7800 Pure hypercholesterolemia, unspecified: Secondary | ICD-10-CM

## 2010-06-14 DIAGNOSIS — E119 Type 2 diabetes mellitus without complications: Secondary | ICD-10-CM

## 2010-06-14 DIAGNOSIS — E785 Hyperlipidemia, unspecified: Secondary | ICD-10-CM

## 2010-06-14 DIAGNOSIS — E669 Obesity, unspecified: Secondary | ICD-10-CM

## 2010-06-14 MED ORDER — PIOGLITAZONE HCL 30 MG PO TABS: 30.0000 mg | ORAL_TABLET | Freq: Every day | ORAL | Status: DC

## 2010-06-14 NOTE — Progress Notes (Signed)
35 yo male:  Had a child and wife was put in bedrest for 42 days, then transferred, born 2 months.  8 months.  Diabetes Mellitus: Tolerating Medications: yes Compliance with diet: poor Exercise: none Avg blood sugars at home: 100-210 Foot problems: none Hypoglycemia: none No nausea, vomitting, blurred vision, polyuria. No recent eye exam  Lab Results  Component Value Date   HGBA1C 5.8 04/25/2009      Chemistry      Component Value Date/Time   NA 143 04/25/2009 1626   K 4.4 04/25/2009 1626   CL 109 04/25/2009 1626   CO2 30 04/25/2009 1626   BUN 20 04/25/2009 1626   CREATININE 1.0 04/25/2009 1626      Component Value Date/Time   CALCIUM 9.2 04/25/2009 1626   ALKPHOS 62 04/25/2009 1626   AST 30 04/25/2009 1626   ALT 38 04/25/2009 1626   BILITOT 0.7 04/25/2009 1626      2.  Lipids: Doing well, stable. Tolerating meds fine with no SE. Needs f/u panel  Lab Results  Component Value Date   CHOL 118 08/05/2008   CHOL 172 05/25/2008   CHOL 146 09/29/2007   Lab Results  Component Value Date   HDL 38.20* 08/05/2008   HDL 34.40* 05/25/2008   HDL 30.8* 09/29/2007   Lab Results  Component Value Date   LDLCALC 67 08/05/2008   LDLCALC 117* 05/25/2008   LDLCALC 99 09/29/2007   Lab Results  Component Value Date   TRIG 62.0 08/05/2008   TRIG 103.0 05/25/2008   TRIG 81 09/29/2007   Lab Results  Component Value Date   CHOLHDL 3 08/05/2008   CHOLHDL 5 05/25/2008   CHOLHDL 4.7 CALC 09/29/2007    Lab Results  Component Value Date   ALT 38 04/25/2009   AST 30 04/25/2009   ALKPHOS 62 04/25/2009   BILITOT 0.7 04/25/2009   3. Obesity, cont with trouble with weight, worsened after wife's pregnancy, birth of 3 month old  The PMH, PSH, Social History, Family History, Medications, and allergies have been reviewed in [facility], and have been updated if relevant.  ROS: GEN: No acute illnesses, no fevers, chills. GI: No n/v/d, eating normally Pulm: No SOB Interactive and getting along well at  home.  Otherwise, ROS is as per the HPI.   GEN: WDWN, NAD, Non-toxic, A & O x 3 HEENT: Atraumatic, Normocephalic. Neck supple. No masses, No LAD. Ears and Nose: No external deformity. CV: RRR, No M/G/R. No JVD. No thrill. No extra heart sounds. PULM: CTA B, no wheezes, crackles, rhonchi. No retractions. No resp. distress. No accessory muscle use. ABD: S, NT, ND, +BS. No rebound tenderness. No HSM.  EXTR: No c/c/e NEURO Normal gait.  PSYCH: Normally interactive. Conversant. Not depressed or anxious appearing.  Calm demeanor.   A/P: 1. DM, check a1c, BMP. Cont actos for now We talked about actos.  At this point, there is no clear evidence to direct a change in meds (re: actos).  Med is tolerated (no h/o bladder CA and no h/o blood in urine) and benefit from control of DM2 outweighs other considerations.  Pt agrees to continue actos.   2. Lipids, check LDL. Adjust meds if needed 3. Obesity. Try to eat minimal fatty food and eat more fruits and vegetables. Anything fried is bad, cream, beef and pork fat are bad. Exercise is incredibly important to heart health. Try to exercise for at least 30 minutes 5 - 6 times a week

## 2010-06-15 LAB — BASIC METABOLIC PANEL
BUN: 16 mg/dL (ref 6–23)
CO2: 28 mEq/L (ref 19–32)
Chloride: 104 mEq/L (ref 96–112)
Creatinine, Ser: 0.9 mg/dL (ref 0.4–1.5)
Potassium: 4.3 mEq/L (ref 3.5–5.1)

## 2010-06-15 LAB — LDL CHOLESTEROL, DIRECT: Direct LDL: 119.2 mg/dL

## 2010-06-15 LAB — HEPATIC FUNCTION PANEL
ALT: 50 U/L (ref 0–53)
AST: 32 U/L (ref 0–37)
Alkaline Phosphatase: 73 U/L (ref 39–117)
Total Bilirubin: 0.6 mg/dL (ref 0.3–1.2)

## 2010-06-19 MED ORDER — ATORVASTATIN CALCIUM 80 MG PO TABS: 80.0000 mg | ORAL_TABLET | Freq: Every day | ORAL | Status: DC

## 2010-06-19 NOTE — Progress Notes (Signed)
Addended by: Benny Lennert on: 06/19/2010 08:43 AM   Modules accepted: Orders

## 2010-07-25 NOTE — Procedures (Signed)
NAME:  Tyler Hoffman, Tyler Hoffman NO.:  [id number]   MEDICAL RECORD NO.:  [id number]          PATIENT TYPE:  OUT   LOCATION:  SLEEP CENTER                 FACILITY[facility]  [facility]   PHYSICIAN:  Barbaraann Share, MD,FCCPDATE OF BIRTH:  March 02, 1975   DATE OF STUDY:  05/10/2007                            NOCTURNAL POLYSOMNOGRAM   REFERRING PHYSICIAN:  Barbaraann Share, MD,FCCP   INDICATIONS FOR PROCEDURE:  Hypersomnia with sleep apnea.   RESULTS:  Epward score is 17.   SLEEP ARCHITECTURE:  The patient had a total sleep time of 368 minutes  with very little slow wave sleep or REM. Sleep onset latency was normal  at 23 minutes and REM onset. Sleep efficiency was decreased at 84%.   RESPIRATORY DATA:  The patient was found to have 44 apnea's and 4  hypopnea's for an apnea hypopnea index of 8 events per hour. The patient  was also noted to have 96 respiratory effort related arousals, giving  him a respiratory disturbance index of 24 events per hour. The events  were more frequent in supine REM and there was loud snoring noted  throughout.   OXYGEN DATA:  There was O2 desaturation as low as 80% with his  obstructive events.   CARDIAC DATA:  No clinically significant arrhythmias were noted.   MOVEMENT/PARASOMNIA:  No clinically significant leg jerks or abnormal  behaviors were noted.   IMPRESSION/RECOMMENDATION:  1. Mild obstructive sleep apnea/hypopnea syndrome with an      apnea/hypopnea index of 8 events per hour and O2 desaturation as      low as 80%. The patient also had 96 respiratory effort related      arousals, giving him a respiratory disturbance index of 24 events      per hour. Treatment for this degree of sleep apnea can include      weight loss alone if applicable, upper airway surgery, oral      appliance, and also CPAP. Clinical correlation is suggested.      Barbaraann Share, MD,FCCP  Diplomate, American Board of Sleep  Medicine  Electronically Signed    [facility]/MEDQ  D:  05/16/2007 14:03:46  T:  05/16/2007 20:23:56  Job:  [id number]

## 2010-07-28 NOTE — Op Note (Signed)
Tyler Hoffman, Tyler Hoffman                             ACCOUNT NO.:  [id number]   MEDICAL RECORD NO.:  [id number]                   PATIENT TYPE:  AMB   LOCATION:  [facility]                                  FACILITY:  [facility]   PHYSICIAN:  Jefry H. Pollyann Kennedy, M.D.                DATE OF BIRTH:  02/08/1976   DATE OF PROCEDURE:  09/06/2003  DATE OF DISCHARGE:                                 OPERATIVE REPORT   PREOPERATIVE DIAGNOSES:  1. Nasoseptal deviation.  2. Inferior turbinates hypertrophy.  3. Chronic ethmoid sinusitis.  4. Chronic maxillary sinusitis.   POSTOPERATIVE DIAGNOSES:  1. Nasoseptal deviation.  2. Inferior turbinates hypertrophy.  3. Chronic ethmoid sinusitis.  4. Chronic maxillary sinusitis.   PROCEDURE:  1. Nasoseptoplasty.  2. Out-fracture inferior turbinates bilaterally.  3. Bilateral endoscopic anterior ethmoidectomies.  4. Bilateral endoscopic maxillary antrostomies.   SURGEON:  Jefry H. Pollyann Kennedy, M.D.   ANESTHESIA:  General endotracheal.   COMPLICATIONS:  None.   ESTIMATED BLOOD LOSS:  50 cc.   FINDINGS:  Severe bilateral nasoseptal deviation with S-shaped deformity of  the ethmoid plate and bilateral bony spurs.  Enlargement of the inferior  turbinates with very thin, bony component.  Bilateral ethmoid hyperplastic  mucosa with minimal polypoid disease and bilateral maxillary sinus  hyperplastic mucosal disease as well.   DISPOSITION:  The patient tolerated the procedure well, was awakened,  extubated and transferred to the recovery room in stable condition.   HISTORY:  This is a 35 year old gentleman who has a history of recurring and  chronic sinusitis. The risks, benefits and alternatives, complications of  the procedure were explained to the patient, who seemed to understand and  agreed to surgery.   DESCRIPTION OF PROCEDURE:  The patient was taken to the operating room,  placed on the operating table in the supine position. Following induction of  general  endotracheal anesthesia, the face was prepped and draped in a  standard fashion.  Oxymetazoline spray was used preoperatively in the nasal  cavities.  Then 1% Xylocaine with epinephrine was infiltrated into the  septum, the columella, the inferior turbinates and the middle turbinates  bilaterally.  A total of approximately 12 cc was injected.  Afrin soaked  pledgets were placed bilaterally in the nasal cavities.   1. Nasoseptoplasty. A left hemitransfixion incision was created and used to     approach the septal cartilage on the left side. Mucoperichondrial flap     was developed posteriorly, down the left side.  The bony cartilaginous     junction was divided, and a similar flap was developed down the right     side. The ethmoid plate was resected, first resecting the superior     attachment just Jansen-Middleton rongeur and then out-fracturing the     posteroinferior attachments. The fragments were removed using Takahashi     forceps.  The majority of the ethmoid plate  and most of the vomer were     resected.  Cartilaginous septum was in good position.  The mucosal     incision was reapproximated with chromic suture. The septal flaps were     quilted with plain gut.   1. Out-fracturing of inferior turbinates.  The inferior turbinates were out-     fractured using a Therapist, nutritional.  Incisions were made anteriorly and     attempts were made to dissect the mucosa off the bone, but the bone was     extremely thin and fragile, and out-fracture was all that was necessary.   1. Bilateral anterior ethmoidectomies.  Using combination of 0 and 30     degrees nasal endoscopes, the uncinate process was taken down with a     sickle knife.  The bulla was opened, and a complete anterior     ethmoidectomy was performed using micro-debrider and Wilde-Blakesley     forceps.  The ground lamella was kept intact. The lateral limit of     dissection was the lamina papyracea, which was also kept intact.  The     superior limit was the roof of the ethmoid, and this was kept intact     without any dehiscence.  The frontal sinus recess area was explored with     suction, but was left unmolested.  Hyperplastic mucosa and small areas     with polypoid degeneration were identified on both sides. The same     procedure was performed on both sides.   1. Bilateral endoscopic maxillary antrostomies.  After taking down the     uncinate process, the natural ostium of the maxillary sinus was     identified within the fontanel bilaterally.  The antrostomy was opened     widely anteriorly using back-biting forceps and posteriorly and     superiorly using through-cut forceps.  There was hyperplastic mucosal     disease present, both sides that was cleaned out. There was minimal     bleeding. The nasal and sinus cavities were suctioned of blood and     secretions, and the nasal cavities were packed with rolled up Telfa     coated with Bacitracin ointment.  Pharynx was suctioned using direct     visualization.   The patient was then awakened, extubated and transferred to the recovery  room in stable condition.                                               Jefry H. Pollyann Kennedy, M.D.    [facility]/MEDQ  D:  09/06/2003  T:  09/06/2003  Job:  [id number]   cc:   Marlise Eves, M.D.

## 2010-07-28 NOTE — Assessment & Plan Note (Signed)
[facility]                              CARDIOLOGY OFFICE NOTE   Tyler Hoffman, Tyler Hoffman                          MRN:          [id number]  DATE:10/16/2005                            DOB:          1975/06/04    PRIMARY CARE PHYSICIAN:  Arta Silence, M.D.   REASON FOR PRESENTATION:  Evaluate patient with chest pain.   HISTORY OF PRESENT ILLNESS:  The patient is a pleasant 35 year old gentleman  who has no past cardiac history.  He does have significant risk factors.  He  has noticed for the past month a discomfort in his neck.  This happens with  peak exertion such as climbing, carrying something up a flight of stairs.  This has been a stable pattern.  It is moderately intense.  He calls it an  aching.  It goes away when he rests.  He says he has not noticed it when he  has taken aspirin.  He does not notice it with other activities that are  less strenuous and he has had none of this at rest.  He has had no  associated nausea, vomiting, diaphoresis.  He has had no palpitations, no  presyncope, or syncope.  He has had no shortness of breath.  Denies any PND  or orthopnea.   PAST MEDICAL HISTORY:  Dyslipidemia (recent cholesterol with a triglyceride  of 354, total 236, and LDL 125, HDL of 28.3).  He has no history of  hypertension or diabetes.   PAST SURGICAL HISTORY:  Sinus surgery.   ALLERGIES:  SEPTRA.   MEDICATIONS:  1.  Aspirin 325 mg b.i.d.  2.  Claritin.  3.  Prevacid.   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  He is  single and has no children.   FAMILY HISTORY:  Contributory for his mother having coronary disease with an  acute anterior infarction in her 83s, status post stenting (a patient of  mine).   REVIEW OF SYSTEMS:  As stated in the HPI and negative for other systems.   PHYSICAL EXAMINATION:  GENERAL:  The patient is in no distress.  VITAL SIGNS:  Blood pressure 132/84, heart rate 88 and  regular, weight 196  pounds, body mass index 34.  HEENT:  Eyes are unremarkable.  Pupils equal, round, reactive to light.  Fundi not visualized.  Oral mucosa normal.  NECK:  No jugular venous distention, wave form within normal limits, carotid  upstroke brisk and symmetric, no bruits, no thyromegaly.  LYMPHATICS:  No cervical, axillary, or inguinal adenopathy.  LUNGS:  Clear to auscultation bilaterally.  BACK:  No costovertebral angle tenderness.  CHEST:  Unremarkable.  HEART:  PMI not displaced or sustained.  S1 and S2 within normal limits, no  S3, no S4, no murmurs.  ABDOMEN:  Obese, positive bowel sounds, normal in frequency and pitch.  No  bruits, no rebound, guarding, no midline pulsatile mass, no  hepatosplenomegaly.  SKIN:  No rashes.  EXTREMITIES:  2+ pulses without edema, cyanosis, or clubbing.  NEUROLOGIC:  Oriented  to person, place, and time.  Cranial nerves II-XII  grossly intact.  Motor grossly intact.   LABORATORY DATA:  EKG shows sinus rhythm, rate 88, axis within normal  limits, intervals within normal limits, no acute ST-wave changes.   ASSESSMENT AND PLAN:  1.  Chest discomfort.  The patient has chest discomfort/neck discomfort that      is somewhat worrisome.  He does have risk factors with a family history      and dyslipidemia.  It seems to be a somewhat stable exertional pattern.      At this point, I am going to get an exercise Cardiolite to further rule      out with moderate pre-test probability of high-grade obstructive      coronary disease.  Further evaluation based on these results.  2.  Obesity.  We had a long discussion about the need to lose weight with      diet and exercise, and I had a long discussion with him about how      exactly to do this.  I recommended the [facility] Diet and a specific      exercise regimen.  3.  Dyslipidemia.  Again, we discussed diet.  I will defer to his primary      care doctor for management.  I suggest repeating  this in three months      after he has had an aggressive trial of therapeutic lifestyle changes.                               Rollene Rotunda, MD, [facility]    JH/MedQ  DD:  10/16/2005  DT:  10/16/2005  Job #:  [id number]   cc:   Willaim Sheng D. Bean, FNP

## 2010-10-06 ENCOUNTER — Telehealth: Payer: Self-pay | Admitting: *Deleted

## 2010-10-06 NOTE — Telephone Encounter (Signed)
Patient says that he had been having a lot of joint pain especially in his knees, he stopped taking his lipitor for about 2 weeks and since he hasn't been taking it the pain has gotten better. He is asking if he can try something else. [facility] [address]

## 2010-10-07 NOTE — Telephone Encounter (Signed)
D/c lipitor  Send in pravastatin 10 mg, 1 po qhs, #30, 2 refills  Update in list  thanks

## 2010-10-09 MED ORDER — PRAVASTATIN SODIUM 10 MG PO TABS: 10.0000 mg | ORAL_TABLET | Freq: Every evening | ORAL | Status: DC

## 2010-10-09 NOTE — Telephone Encounter (Signed)
Left message for patient to return call discontinued lipitor on medication list and added pravastatin 10mg  and [facility] [address]

## 2010-10-10 NOTE — Telephone Encounter (Signed)
Patient advised.

## 2010-12-05 ENCOUNTER — Ambulatory Visit (INDEPENDENT_AMBULATORY_CARE_PROVIDER_SITE_OTHER): Payer: BC Managed Care – PPO | Admitting: Cardiology

## 2010-12-05 ENCOUNTER — Encounter: Payer: Self-pay | Admitting: Cardiology

## 2010-12-05 DIAGNOSIS — E7800 Pure hypercholesterolemia, unspecified: Secondary | ICD-10-CM

## 2010-12-05 DIAGNOSIS — R0609 Other forms of dyspnea: Secondary | ICD-10-CM

## 2010-12-05 DIAGNOSIS — R079 Chest pain, unspecified: Secondary | ICD-10-CM

## 2010-12-05 DIAGNOSIS — R0602 Shortness of breath: Secondary | ICD-10-CM

## 2010-12-05 DIAGNOSIS — E785 Hyperlipidemia, unspecified: Secondary | ICD-10-CM

## 2010-12-05 DIAGNOSIS — Z79899 Other long term (current) drug therapy: Secondary | ICD-10-CM

## 2010-12-05 DIAGNOSIS — E669 Obesity, unspecified: Secondary | ICD-10-CM

## 2010-12-05 DIAGNOSIS — R0600 Dyspnea, unspecified: Secondary | ICD-10-CM

## 2010-12-05 NOTE — Progress Notes (Signed)
HPI This patient presents for evaluation of the above.  I saw him in 2007 for chest pain.  He has had no evidence of CAD in the past.  He does have a family history of CAD.  He presents for evaluation of dyspnea.  He has been noticing this more for the last month.  He gets dyspneic with activity such as mowing the lawn.  He had significant dyspnea [place] recently.  He reports a mildly decreased exercise tolerance.  The patient denies any new symptoms such as chest discomfort, neck or arm discomfort. There has been no PND or orthopnea. There have been no reported palpitations, presyncope or syncope.  He has had no weight gain or edema   Allergies  Allergen Reactions  . Metformin     REACTION: diarrhea  . Pseudoephedrine   . Sulfamethoxazole W/Trimethoprim     REACTION: red all over and swelling  . Sulfonamide Derivatives     REACTION: swelling \\T \ rash    Current Outpatient Prescriptions  Medication Sig Dispense Refill  . aspirin 81 MG tablet Take 81 mg by mouth daily.        Marland Kitchen glucose blood test strip 1 each by Other route 2 (two) times daily. Use as instructed (freestyle)       . metroNIDAZOLE (METROGEL) 1 % gel Apply 1 application topically daily.        . pioglitazone (ACTOS) 30 MG tablet Take 1 tablet (30 mg total) by mouth daily.  30 tablet  11  . pravastatin (PRAVACHOL) 10 MG tablet Take 1 tablet (10 mg total) by mouth every evening.  30 tablet  2    Past Medical History  Diagnosis Date  . Unspecified sleep apnea   . Obesity, unspecified   . Diabetes mellitus   . History of rosacea   . Dyslipidemia     Past Surgical History  Procedure Date  . Nasoplasty 08-2003    Family History  Problem Relation Age of Onset  . Heart disease Mother   . Heart disease Father     History   Social History  . Marital Status: Single    Spouse Name: N/A    Number of Children: N/A  . Years of Education: N/A   Occupational History  . assistant principle    Social  History Main Topics  . Smoking status: Never Smoker   . Smokeless tobacco: Not on file  . Alcohol Use: Yes  . Drug Use: No  . Sexually Active: Not on file   Other Topics Concern  . Not on file   Social History Narrative   Daily Caffeine Use 2-3 cups per day    ROS:  As stated in the HPI and negative for all other systems.  PHYSICAL EXAM BP 126/74  Pulse 105  Ht 5\' 3"  (1.6 m)  Wt 209 lb (94.802 kg)  BMI 37.02 kg/m2 GENERAL:  Well appearing HEENT:  Pupils equal round and reactive, fundi not visualized, oral mucosa unremarkable NECK:  No jugular venous distention, waveform within normal limits, carotid upstroke brisk and symmetric, no bruits, no thyromegaly LYMPHATICS:  No cervical, inguinal adenopathy LUNGS:  Clear to auscultation bilaterally BACK:  No CVA tenderness CHEST:  Unremarkable HEART:  PMI not displaced or sustained,S1 and S2 within normal limits, no S3, no S4, no clicks, no rubs, no murmurs ABD:  Flat, positive bowel sounds normal in frequency in pitch, no bruits, no rebound, no guarding, no midline pulsatile mass, no hepatomegaly, no  splenomegaly, obese EXT:  2 plus pulses throughout, no edema, no cyanosis no clubbing SKIN:  No rashes no nodules NEURO:  Cranial nerves II through XII grossly intact, motor grossly intact throughout PSYCH:  Cognitively intact, oriented to person place and time   EKG:  Sinus rhythm, rate 105, axis within normal limits, intervals within normal limits, no acute ST-T wave changes.   ASSESSMENT AND PLAN

## 2010-12-05 NOTE — Patient Instructions (Signed)
Your physician has requested that you have an exercise tolerance test. For further information please visit [email or web address] Please also follow instruction sheet, as given. Dx CHEST PAIN  SOON WITH DR [facility]  Your physician recommends that you return for lab work in: FASTING  LIPID SAME DAY AS TREADMILL

## 2010-12-06 ENCOUNTER — Encounter: Payer: Self-pay | Admitting: Cardiology

## 2010-12-06 DIAGNOSIS — R0600 Dyspnea, unspecified: Secondary | ICD-10-CM | POA: Insufficient documentation

## 2010-12-06 NOTE — Assessment & Plan Note (Signed)
I will repeat a lipid profile.  We discussed diet.  I would suggest a goal LDL less than 70 and HDL greater than 50.

## 2010-12-06 NOTE — Assessment & Plan Note (Signed)
I will start with an ETT to rule out obstructive CAD.  I have also suggested a coronary calcium score if the ETT is normal given his strong family history.  Of course an abnormal ETT will require further treatment.  He needs aggressive primary risk reduction.  We discussed this at great length.

## 2010-12-06 NOTE — Assessment & Plan Note (Signed)
The patient understands the need to lose weight with diet and exercise. We have discussed specific strategies for this.  

## 2010-12-12 ENCOUNTER — Ambulatory Visit (INDEPENDENT_AMBULATORY_CARE_PROVIDER_SITE_OTHER): Payer: BC Managed Care – PPO | Admitting: *Deleted

## 2010-12-12 ENCOUNTER — Encounter: Payer: BC Managed Care – PPO | Admitting: Cardiology

## 2010-12-12 ENCOUNTER — Ambulatory Visit (INDEPENDENT_AMBULATORY_CARE_PROVIDER_SITE_OTHER): Payer: BC Managed Care – PPO | Admitting: Cardiology

## 2010-12-12 DIAGNOSIS — R079 Chest pain, unspecified: Secondary | ICD-10-CM

## 2010-12-12 DIAGNOSIS — E785 Hyperlipidemia, unspecified: Secondary | ICD-10-CM

## 2010-12-12 DIAGNOSIS — E7800 Pure hypercholesterolemia, unspecified: Secondary | ICD-10-CM

## 2010-12-12 LAB — HEPATIC FUNCTION PANEL
AST: 35 U/L (ref 0–37)
Albumin: 4.4 g/dL (ref 3.5–5.2)
Alkaline Phosphatase: 72 U/L (ref 39–117)
Bilirubin, Direct: 0.1 mg/dL (ref 0.0–0.3)

## 2010-12-12 LAB — LIPID PANEL
Cholesterol: 283 mg/dL — ABNORMAL HIGH (ref 0–200)
Total CHOL/HDL Ratio: 6

## 2010-12-12 NOTE — Patient Instructions (Signed)
Your physician has requested that you have cardiac calcium score. Cardiac computed tomography (CT) is a painless test that uses an x-ray machine to take clear, detailed pictures of your heart. For further information please visit [email or web address] Please follow instruction sheet as given.  Continue current medications

## 2010-12-12 NOTE — Progress Notes (Signed)
Exercise Treadmill Test  Pre-Exercise Testing Evaluation Rhythm: normal sinus  Rate: 78   PR:  .14 QRS:  .07  QT:  .38 QTc: .43     Test  Exercise Tolerance Test Ordering MD: Angelina Sheriff, MD  Interpreting MD:  Angelina Sheriff, MD  Unique Test No: 1  Treadmill:  1  Indication for ETT: chest pain - rule out ischemia  Contraindication to ETT: No   Stress Modality: exercise - treadmill  Cardiac Imaging Performed: non   Protocol: standard Bruce - maximal  Max BP:  135/64  Max MPHR (bpm):  184 85% MPR (bpm):  156  MPHR obtained (bpm):  162 % MPHR obtained:  86  Reached 85% MPHR (min:sec):  9:16 Total Exercise Time (min-sec):  9:35  Workload in METS:  11.0 Borg Scale: 17  Reason ETT Terminated:  desired heart rate attained    ST Segment Analysis At Rest: normal ST segments - no evidence of significant ST depression With Exercise: no evidence of significant ST depression  Other Information Arrhythmia:  No Angina during ETT:  absent (0) Quality of ETT:  diagnostic  ETT Interpretation:  normal - no evidence of ischemia by ST analysis  Comments: The patient had an excellent exercise tolerance.  There was no chest pain.  There was an appropriate level of dyspnea.  There were no arrhythmias, a normal heart rate response and normal BP response.  There were no ischemic ST T wave changes and a normal heart rate recovery.  Recommendations: Negative adequate ETT.  No further testing is indicated.  Based on the above I gave the patient a prescription for exercise.  I would like the patient to have a coronary calcium score given the family history.

## 2010-12-22 ENCOUNTER — Telehealth: Payer: Self-pay | Admitting: Cardiology

## 2010-12-22 DIAGNOSIS — E7800 Pure hypercholesterolemia, unspecified: Secondary | ICD-10-CM

## 2010-12-22 NOTE — Telephone Encounter (Signed)
Pt returned your call regarding lab results.  Please call him back

## 2010-12-26 MED ORDER — PRAVASTATIN SODIUM 20 MG PO TABS: 20.0000 mg | ORAL_TABLET | Freq: Every evening | ORAL | Status: DC

## 2010-12-26 NOTE — Telephone Encounter (Signed)
Reviewed results of lipid - [facility]  He will repeat fasting lipid and liver in 8 weeks.  Pt is ready to have Ca Score as ordered by Dr Antoine Poche

## 2010-12-28 ENCOUNTER — Encounter: Payer: Self-pay | Admitting: Cardiology

## 2011-01-11 ENCOUNTER — Ambulatory Visit (HOSPITAL_COMMUNITY)
Admission: RE | Admit: 2011-01-11 | Discharge: 2011-01-11 | Disposition: A | Discharge status: Home / Self Care | Payer: BC Managed Care – PPO | Source: Ambulatory Visit | Attending: Cardiology | Admitting: Cardiology

## 2011-01-11 DIAGNOSIS — R079 Chest pain, unspecified: Secondary | ICD-10-CM

## 2011-01-11 DIAGNOSIS — R9389 Abnormal findings on diagnostic imaging of other specified body structures: Secondary | ICD-10-CM | POA: Insufficient documentation

## 2011-01-11 DIAGNOSIS — I2510 Atherosclerotic heart disease of native coronary artery without angina pectoris: Secondary | ICD-10-CM

## 2011-03-13 ENCOUNTER — Other Ambulatory Visit: Payer: Self-pay | Admitting: Family Medicine

## 2011-04-09 ENCOUNTER — Encounter: Payer: Self-pay | Admitting: Family Medicine

## 2011-04-09 ENCOUNTER — Ambulatory Visit: Payer: Self-pay | Admitting: Family Medicine

## 2011-04-09 ENCOUNTER — Ambulatory Visit (INDEPENDENT_AMBULATORY_CARE_PROVIDER_SITE_OTHER): Payer: BC Managed Care – PPO | Admitting: Family Medicine

## 2011-04-09 VITALS — BP 120/72 | HR 93 | Temp 98.0°F | Ht 63.0 in | Wt 204.0 lb

## 2011-04-09 DIAGNOSIS — N50819 Testicular pain, unspecified: Secondary | ICD-10-CM

## 2011-04-09 DIAGNOSIS — R300 Dysuria: Secondary | ICD-10-CM

## 2011-04-09 DIAGNOSIS — N50812 Left testicular pain: Secondary | ICD-10-CM

## 2011-04-09 DIAGNOSIS — E7800 Pure hypercholesterolemia, unspecified: Secondary | ICD-10-CM

## 2011-04-09 DIAGNOSIS — N509 Disorder of male genital organs, unspecified: Secondary | ICD-10-CM

## 2011-04-09 DIAGNOSIS — E119 Type 2 diabetes mellitus without complications: Secondary | ICD-10-CM

## 2011-04-09 DIAGNOSIS — Z79899 Other long term (current) drug therapy: Secondary | ICD-10-CM

## 2011-04-09 LAB — POCT URINALYSIS DIPSTICK
Blood, UA: NEGATIVE
Nitrite, UA: NEGATIVE
Urobilinogen, UA: NEGATIVE
pH, UA: 6

## 2011-04-09 MED ORDER — LEVOFLOXACIN 500 MG PO TABS: 500.0000 mg | ORAL_TABLET | Freq: Every day | ORAL | Status: AC

## 2011-04-09 NOTE — Patient Instructions (Signed)
REFERRAL: GO THE THE FRONT ROOM AT THE ENTRANCE OF OUR CLINIC, NEAR CHECK IN. ASK FOR MARION. SHE WILL HELP YOU SET UP YOUR REFERRAL. DATE: TIME:  

## 2011-04-09 NOTE — Progress Notes (Signed)
  Patient Name: Tyler Hoffman Date of Birth: Jul 28, 1975 Age: 36 y.o. Medical Record Number: [id number] Gender: male Date of Encounter: 04/09/2011  History of Present Illness:  Tyler Hoffman is a 36 y.o. very pleasant male patient who presents with the following:  About a week and a half, felt like when maybe epididymitis last year. Also around that same time, had some pain in his testicle. In the shower, felt like the top part of his epididymis was swolen a little bit -- started to have some hurting. No other issues and having to urinate. When full, weird sensation. No STD concern. No pain with ejaculation, no discharge, no penile sores.  Had a varicocele last year.     Past Medical History, Surgical History, Social History, Family History, Problem List, Medications, and Allergies have been reviewed and updated if relevant.  Review of Systems:  GEN: No acute illnesses, no fevers, chills. GI: No n/v/d, eating normally Pulm: No SOB Interactive and getting along well at home.  Otherwise, ROS is as per the HPI.   Physical Examination: Filed Vitals:   04/09/11 1541  BP: 120/72  Pulse: 93  Temp: 98 F (36.7 C)  TempSrc: Oral  Height: 5\' 3"  (1.6 m)  Weight: 204 lb (92.534 kg)  SpO2: 98%    Body mass index is 36.14 kg/(m^2).   GEN: WDWN, NAD, Non-toxic, Alert & Oriented x 3 HEENT: Atraumatic, Normocephalic.  Ears and Nose: No external deformity. EXTR: No clubbing/cyanosis/edema NEURO: Normal gait.  PSYCH: Normally interactive. Conversant. Not depressed or anxious appearing.  Calm demeanor.  GU: R testicle normal. L testicle with some small degree of varicocele, but with some tenderness and firmness at upper edge of testicle vs epididymis. Prostate nontender.  Assessment and Plan: 1. Dysuria  POCT Urinalysis Dipstick, US Scrotum, levofloxacin (LEVAQUIN) 500 MG tablet  2. Testicular pain, left  US Scrotum, levofloxacin (LEVAQUIN) 500 MG tablet  3. Epididymal pain  US Scrotum,  levofloxacin (LEVAQUIN) 500 MG tablet  4. DIABETES MELLITUS, TYPE II  Basic metabolic panel, Hemoglobin A1c, Microalbumin / creatinine urine ratio  5. HYPERCHOLESTEROLEMIA, PURE  LDL cholesterol, direct  6. Encounter for long-term (current) use of other medications  CBC with Differential, Hepatic function panel    UA neg LVQ - probable epidydimitis, with location and exam, I think that an u/s needs to be done to confirm not a testicular cancer

## 2011-04-10 ENCOUNTER — Other Ambulatory Visit: Payer: Self-pay | Admitting: Family Medicine

## 2011-04-10 ENCOUNTER — Ambulatory Visit
Admission: RE | Admit: 2011-04-10 | Discharge: 2011-04-10 | Disposition: A | Discharge status: Home / Self Care | Payer: BC Managed Care – PPO | Source: Ambulatory Visit | Attending: Family Medicine | Admitting: Family Medicine

## 2011-04-10 DIAGNOSIS — R300 Dysuria: Secondary | ICD-10-CM

## 2011-04-10 DIAGNOSIS — N50819 Testicular pain, unspecified: Secondary | ICD-10-CM

## 2011-04-10 DIAGNOSIS — N50812 Left testicular pain: Secondary | ICD-10-CM

## 2011-04-10 LAB — BASIC METABOLIC PANEL
BUN: 15 mg/dL (ref 6–23)
Chloride: 103 mEq/L (ref 96–112)
Creatinine, Ser: 0.9 mg/dL (ref 0.4–1.5)
GFR: 108.06 mL/min (ref >60.00)
Glucose, Bld: 128 mg/dL — ABNORMAL HIGH (ref 70–99)

## 2011-04-10 LAB — CBC WITH DIFFERENTIAL/PLATELET
Basophils Absolute: 0 10*3/uL (ref 0.0–0.1)
Eosinophils Absolute: 0.1 10*3/uL (ref 0.0–0.7)
Eosinophils Relative: 1.7 % (ref 0.0–5.0)
MCV: 98.8 fl (ref 78.0–100.0)
Monocytes Absolute: 0.3 10*3/uL (ref 0.1–1.0)
Neutrophils Relative %: 65 % (ref 43.0–77.0)
Platelets: 224 10*3/uL (ref 150.0–400.0)
RDW: 13 % (ref 11.5–14.6)
WBC: 7.4 10*3/uL (ref 4.5–10.5)

## 2011-04-10 LAB — MICROALBUMIN / CREATININE URINE RATIO
Creatinine,U: 42.1 mg/dL
Microalb Creat Ratio: 0.2 mg/g (ref 0.0–30.0)
Microalb, Ur: 0.1 mg/dL (ref 0.0–1.9)

## 2011-04-10 LAB — HEPATIC FUNCTION PANEL
ALT: 26 U/L (ref 0–53)
Bilirubin, Direct: 0 mg/dL (ref 0.0–0.3)
Total Bilirubin: 0.3 mg/dL (ref 0.3–1.2)

## 2011-04-10 LAB — LDL CHOLESTEROL, DIRECT: Direct LDL: 162.9 mg/dL

## 2011-04-10 LAB — HEMOGLOBIN A1C: Hgb A1c MFr Bld: 6.1 % (ref 4.6–6.5)

## 2011-06-04 ENCOUNTER — Other Ambulatory Visit: Payer: Self-pay | Admitting: Dermatology

## 2011-06-13 ENCOUNTER — Other Ambulatory Visit: Payer: Self-pay | Admitting: *Deleted

## 2011-06-13 DIAGNOSIS — E7800 Pure hypercholesterolemia, unspecified: Secondary | ICD-10-CM

## 2011-06-13 MED ORDER — PIOGLITAZONE HCL 30 MG PO TABS: 30.0000 mg | ORAL_TABLET | Freq: Every day | ORAL | Status: DC

## 2011-06-13 MED ORDER — PRAVASTATIN SODIUM 20 MG PO TABS: 20.0000 mg | ORAL_TABLET | Freq: Every evening | ORAL | Status: DC

## 2011-06-14 ENCOUNTER — Other Ambulatory Visit: Payer: Self-pay

## 2011-06-14 MED ORDER — PIOGLITAZONE HCL 30 MG PO TABS: 30.0000 mg | ORAL_TABLET | Freq: Every day | ORAL | Status: DC

## 2011-06-14 NOTE — Telephone Encounter (Signed)
[facility] 90 day supply request for Pioglitazone 30 mg #90 x 3.

## 2011-07-20 ENCOUNTER — Ambulatory Visit (INDEPENDENT_AMBULATORY_CARE_PROVIDER_SITE_OTHER): Payer: BC Managed Care – PPO | Admitting: Family Medicine

## 2011-07-20 ENCOUNTER — Encounter: Payer: Self-pay | Admitting: Family Medicine

## 2011-07-20 VITALS — BP 120/72 | HR 75 | Temp 98.1°F | Ht 63.0 in | Wt 201.5 lb

## 2011-07-20 DIAGNOSIS — F988 Other specified behavioral and emotional disorders with onset usually occurring in childhood and adolescence: Secondary | ICD-10-CM

## 2011-07-20 DIAGNOSIS — R1012 Left upper quadrant pain: Secondary | ICD-10-CM

## 2011-07-20 LAB — CBC WITH DIFFERENTIAL/PLATELET
Basophils Relative: 0.6 % (ref 0.0–3.0)
Eosinophils Absolute: 0.1 10*3/uL (ref 0.0–0.7)
HCT: 45.6 % (ref 39.0–52.0)
Hemoglobin: 15.5 g/dL (ref 13.0–17.0)
Lymphocytes Relative: 30.9 % (ref 12.0–46.0)
Lymphs Abs: 2.2 10*3/uL (ref 0.7–4.0)
MCHC: 34.1 g/dL (ref 30.0–36.0)
Neutro Abs: 4.3 10*3/uL (ref 1.4–7.7)
RBC: 4.7 Mil/uL (ref 4.22–5.81)

## 2011-07-20 NOTE — Patient Instructions (Signed)
Stop at front desk to set up CT ad/pelvis to evaluate further. We will call with the results. In meantime increase fiber in diet, decrease stress , avoid fatty foods.

## 2011-07-20 NOTE — Progress Notes (Signed)
  Subjective:    Patient ID: Tyler Hoffman, male    DOB: Dec 10, 1975, 36 y.o.   MRN: [id number]  HPI 36 year old diabetic male pt of Dr.Copland's presents with left upper quadrant abdominal pain ongoing  since past few months.  Started as intermittant and now daily. Dull ache. No blood in stool. After eating notes. Has back and forth constipation to diarrhea.  In past had similar pain LLQ.Marland Kitchen Saw GI MD.. Thought it may be spasms, irritable bowel syndrome. Given antispasmodic. Colonoscopy nml  2000, [place] Went away. Had recurrence in 2010 but in LUQ.  Lost weight at that time.  Went away on its own.  NMl CMET, well controlled DM in 03/2011.  Has been having someissues with focusing, concentration. Mother noted issue as a child. No depression, no panic , no anxiety.  Review of Systems  Constitutional: Negative for fever and fatigue.  HENT: Negative for ear pain.   Eyes: Negative for pain.  Respiratory: Negative for chest tightness and shortness of breath.   Cardiovascular: Negative for chest pain, palpitations and leg swelling.  Gastrointestinal: Positive for abdominal pain. Negative for blood in stool.  Skin: Negative for rash.       Objective:   Physical Exam  Constitutional: Vital signs are normal. He appears well-developed and well-nourished.  HENT:  Head: Normocephalic.  Right Ear: Hearing normal.  Left Ear: Hearing normal.  Nose: Nose normal.  Mouth/Throat: Oropharynx is clear and moist and mucous membranes are normal.  Neck: Trachea normal. Carotid bruit is not present. No mass and no thyromegaly present.  Cardiovascular: Normal rate, regular rhythm and normal pulses.  Exam reveals no gallop, no distant heart sounds and no friction rub.   No murmur heard.      No peripheral edema  Pulmonary/Chest: Effort normal and breath sounds normal. No respiratory distress.  Abdominal: Soft. Normal appearance and bowel sounds are normal. There is no hepatosplenomegaly. There is  tenderness in the left upper quadrant. There is no rigidity, no rebound, no guarding and no CVA tenderness. No hernia. Hernia confirmed negative in the ventral area and confirmed negative in the right inguinal area.  Skin: Skin is warm, dry and intact. No rash noted.  Psychiatric: He has a normal mood and affect. His speech is normal and behavior is normal. Thought content normal.          Assessment & Plan:

## 2011-07-23 ENCOUNTER — Ambulatory Visit (INDEPENDENT_AMBULATORY_CARE_PROVIDER_SITE_OTHER)
Admission: RE | Admit: 2011-07-23 | Discharge: 2011-07-23 | Disposition: A | Discharge status: Home / Self Care | Payer: BC Managed Care – PPO | Source: Ambulatory Visit | Attending: Family Medicine | Admitting: Family Medicine

## 2011-07-23 DIAGNOSIS — R1012 Left upper quadrant pain: Secondary | ICD-10-CM

## 2011-07-23 MED ORDER — IOHEXOL 300 MG/ML  SOLN
100.0000 mL | Freq: Once | INTRAMUSCULAR | Status: AC | PRN
  Administered 2011-07-23: 100 mL via INTRAVENOUS

## 2011-07-25 DIAGNOSIS — F988 Other specified behavioral and emotional disorders with onset usually occurring in childhood and adolescence: Secondary | ICD-10-CM | POA: Insufficient documentation

## 2011-07-25 NOTE — Assessment & Plan Note (Signed)
Offered referral to [facility] for further eval. He will insteasd discuss with primary MD first.

## 2011-07-25 NOTE — Assessment & Plan Note (Signed)
Most likely functional pain.. ? Irritable bowel syndrome. Recent labs nml cbc and CMET. Will eval with Ct abd/pelvis given recurrrent pain. No clear suggestion of infection. Work on high [employer], avoid greasy foods. Follow up after tests with primary MD if not improving.

## 2011-09-14 ENCOUNTER — Other Ambulatory Visit: Payer: Self-pay

## 2011-09-26 ENCOUNTER — Ambulatory Visit (INDEPENDENT_AMBULATORY_CARE_PROVIDER_SITE_OTHER): Payer: BC Managed Care – PPO | Admitting: Family Medicine

## 2011-09-26 ENCOUNTER — Encounter: Payer: Self-pay | Admitting: Family Medicine

## 2011-09-26 VITALS — BP 122/84 | HR 80 | Temp 98.5°F | Wt 204.0 lb

## 2011-09-26 DIAGNOSIS — G44209 Tension-type headache, unspecified, not intractable: Secondary | ICD-10-CM

## 2011-09-26 DIAGNOSIS — M79601 Pain in right arm: Secondary | ICD-10-CM

## 2011-09-26 DIAGNOSIS — M79609 Pain in unspecified limb: Secondary | ICD-10-CM

## 2011-09-26 NOTE — Progress Notes (Signed)
  Subjective:    Patient ID: Tyler Hoffman, male    DOB: 01-Jul-1975, 36 y.o.   MRN: [id number]  HPI  36 year old male  Pt of Dr. Cyndie Chime presents with several issues.  1.  Right arm heaviness x 1 week. Soreness and tingling in ulnar side of forearm.  After typing all day felt cramping in hand.   Continues today. No chest pain  Dog  does jerk leash.  2. Last few days foggy feeling, headache.  He has been having a lot of issues with stress. No depression, some anxiety.  Stress test last year was normal. Minimal water intake and  Lots of coffee...has tried to increase more.   Review of Systems  Constitutional: Negative for fever.  HENT: Negative for ear pain.   Eyes: Negative for pain.  Respiratory: Negative for shortness of breath.   Cardiovascular: Negative for chest pain, palpitations and leg swelling.  Gastrointestinal: Negative for abdominal pain.       Objective:   Physical Exam  Constitutional: He is oriented to person, place, and time. Vital signs are normal. He appears well-developed and well-nourished.  HENT:  Head: Normocephalic.  Right Ear: Hearing normal.  Left Ear: Hearing normal.  Nose: Nose normal.  Mouth/Throat: Oropharynx is clear and moist and mucous membranes are normal.  Neck: Trachea normal and normal range of motion. Neck supple. Carotid bruit is not present. No mass and no thyromegaly present.       Neg spurling's  Cardiovascular: Normal rate, regular rhythm and normal pulses.  Exam reveals no gallop, no distant heart sounds and no friction rub.   No murmur heard.      No peripheral edema  Pulmonary/Chest: Effort normal and breath sounds normal. No respiratory distress.  Neurological: He is alert and oriented to person, place, and time. No cranial nerve deficit. Coordination normal.  Skin: Skin is warm, dry and intact. No rash noted.  Psychiatric: He has a normal mood and affect. His speech is normal and behavior is normal. Thought content normal.     Neg Tinel, phalen and median nerve compression. Positive ulnar nerve compression.      Assessment & Plan:

## 2011-09-26 NOTE — Patient Instructions (Addendum)
Change position of arm at work, sleep and change leash type to help with ulnar nerve compression.. Work on stress relief, relaxation techniques, exercise. Call if symptoms not improving.

## 2011-10-12 DIAGNOSIS — G4489 Other headache syndrome: Secondary | ICD-10-CM | POA: Insufficient documentation

## 2011-10-12 DIAGNOSIS — M79601 Pain in right arm: Secondary | ICD-10-CM | POA: Insufficient documentation

## 2011-10-12 NOTE — Assessment & Plan Note (Addendum)
No sign of radiculopathy from neck. Most consistent with ulnar neuropathy. Change position of arm at work, sleep and change leash type to help with ulnar nerve compression.

## 2011-10-12 NOTE — Assessment & Plan Note (Signed)
nml exam.. Likely due to stress. Disucssed stress reduction relaxation and OTC treatment.

## 2011-11-05 ENCOUNTER — Encounter: Payer: Self-pay | Admitting: Pulmonary Disease

## 2011-11-05 ENCOUNTER — Telehealth: Payer: Self-pay | Admitting: Pulmonary Disease

## 2011-11-05 ENCOUNTER — Ambulatory Visit (INDEPENDENT_AMBULATORY_CARE_PROVIDER_SITE_OTHER): Payer: BC Managed Care – PPO | Admitting: Pulmonary Disease

## 2011-11-05 VITALS — BP 148/80 | HR 97 | Temp 98.3°F | Ht 63.0 in | Wt 204.4 lb

## 2011-11-05 DIAGNOSIS — G4733 Obstructive sleep apnea (adult) (pediatric): Secondary | ICD-10-CM

## 2011-11-05 NOTE — Telephone Encounter (Signed)
I spoke with the pt and he states his cpap mask has broken and he needs an order for a new one. Pt last seen on 12-24-2008, so not a new pt but needs an OV for order. Appt set for today at 4:15.Carron Curie, CMA

## 2011-11-05 NOTE — Assessment & Plan Note (Signed)
The patient has a history of mild sleep apnea, and was successful at weight loss and able to come off the device.  However, he has regained his weight, and restarted CPAP after he began to have worsening symptoms.  He is now in need of a new mask and supplies, and we should take this opportunity to optimize his pressure again.  I have also encouraged him to work aggressively on weight loss.

## 2011-11-05 NOTE — Patient Instructions (Addendum)
Will get you a new cpap mask, and make sure you have all the necessary supplies. Will re-optimize your pressure with an auto device for 2 weeks, and will call you once I receive your download. Work on weight loss followup in with me in one year, but call if having issues.

## 2011-11-05 NOTE — Progress Notes (Signed)
  Subjective:    Patient ID: Tyler Hoffman, male    DOB: January 14, 1976, 36 y.o.   MRN: [id number]  HPI The patient comes in today for followup of his known obstructive sleep apnea.  He was last seen in October of 2010, where he was losing weight successfully, and his symptoms were resolving.  He quit using his CPAP, and I asked him to continue with his weight loss.  I have not seen him since that time, and his weight has increased by 27 pounds since the last visit.  He noted that his snoring worsened, as did his sleep disruption and daytime sleepiness.  The patient restarted his CPAP and did well, but now is in need of a new mask and supplies.   Review of Systems  Constitutional: Negative for fever and unexpected weight change.  HENT: Negative for ear pain, nosebleeds, congestion, sore throat, rhinorrhea, sneezing, trouble swallowing, dental problem, postnasal drip and sinus pressure.   Eyes: Negative for redness and itching.  Respiratory: Negative for cough, chest tightness, shortness of breath and wheezing.   Cardiovascular: Negative for palpitations and leg swelling.  Gastrointestinal: Negative for nausea and vomiting.  Genitourinary: Negative for dysuria.  Musculoskeletal: Negative for joint swelling.  Skin: Negative for rash.  Neurological: Negative for headaches.  Hematological: Does not bruise/bleed easily.  Psychiatric/Behavioral: Negative for dysphoric mood. The patient is not nervous/anxious.        Objective:   Physical Exam Obese male in no acute distress Nose without purulent discharge noted No skin breakdown or pressure necrosis from the CPAP mask Lower extremities without edema, no cyanosis Alert and oriented, moves all 4 extremities.       Assessment & Plan:

## 2011-11-06 ENCOUNTER — Encounter: Payer: Self-pay | Admitting: Pulmonary Disease

## 2011-12-27 ENCOUNTER — Telehealth: Payer: Self-pay | Admitting: Pulmonary Disease

## 2011-12-27 NOTE — Telephone Encounter (Signed)
Tyler Hoffman with [facility] faxed over report and I have given report to Dr. Teddy Spike nurse. Tyler Hoffman I have spoke with patient and advised patient that we have the report and Dr. Shelle Iron will review the report and we will let him know what report indicated. [name]

## 2011-12-27 NOTE — Telephone Encounter (Signed)
Left message for Mayra Reel from [facility] to fax cpap download over to Dr. Shelle Iron at [phone]. Rhonda J Cobb

## 2011-12-28 ENCOUNTER — Other Ambulatory Visit: Payer: Self-pay | Admitting: Pulmonary Disease

## 2011-12-28 DIAGNOSIS — G4733 Obstructive sleep apnea (adult) (pediatric): Secondary | ICD-10-CM

## 2011-12-28 NOTE — Telephone Encounter (Signed)
Order sent.

## 2011-12-28 NOTE — Telephone Encounter (Signed)
Where is this report?

## 2011-12-28 NOTE — Telephone Encounter (Signed)
Report is in your green folder on the left hand side of your desk. Rhonda J Cobb

## 2012-06-18 ENCOUNTER — Other Ambulatory Visit: Payer: Self-pay | Admitting: Family Medicine

## 2012-06-26 ENCOUNTER — Ambulatory Visit (INDEPENDENT_AMBULATORY_CARE_PROVIDER_SITE_OTHER): Payer: BC Managed Care – PPO | Admitting: Family Medicine

## 2012-06-26 ENCOUNTER — Encounter: Payer: Self-pay | Admitting: Family Medicine

## 2012-06-26 VITALS — BP 112/60 | HR 87 | Temp 98.3°F | Wt 209.0 lb

## 2012-06-26 DIAGNOSIS — N509 Disorder of male genital organs, unspecified: Secondary | ICD-10-CM

## 2012-06-26 DIAGNOSIS — N50819 Testicular pain, unspecified: Secondary | ICD-10-CM

## 2012-06-26 DIAGNOSIS — E7800 Pure hypercholesterolemia, unspecified: Secondary | ICD-10-CM

## 2012-06-26 DIAGNOSIS — E119 Type 2 diabetes mellitus without complications: Secondary | ICD-10-CM

## 2012-06-26 MED ORDER — PIOGLITAZONE HCL 30 MG PO TABS: 30.0000 mg | ORAL_TABLET | Freq: Every day | ORAL | Status: DC

## 2012-06-26 MED ORDER — AMOXICILLIN-POT CLAVULANATE 875-125 MG PO TABS: 1.0000 | ORAL_TABLET | Freq: Two times a day (BID) | ORAL | Status: DC

## 2012-06-26 MED ORDER — PRAVASTATIN SODIUM 20 MG PO TABS: 20.0000 mg | ORAL_TABLET | Freq: Every day | ORAL | Status: DC

## 2012-06-26 NOTE — Progress Notes (Signed)
Nature conservation officer at [facility] [address] [place] [address] Phone: [phone] Fax: [phone]  Date:  06/26/2012   Name:  Tyler Hoffman   DOB:  [birth year]   MRN:  [id number] Gender: male Age: 37 y.o.  Primary Physician:  Hannah Beat, MD  Evaluating MD: Hannah Beat, MD   Chief Complaint: bladder issues   History of Present Illness:  Tyler Hoffman is a 37 y.o. pleasant patient who presents with the following:  Still having issues with Left testicle swelling. Pain is intermittent.  Previously, I thought that he has an epididymitis. We have put him on antibiotics in the past. The pain is not always present. We also have actually done ultrasound, and it turned out negative and reassuring as well. He denies discharge. He denies penile ulceration. No pain with urination. No STD exposure to his knowledge.  Followup diabetes. He reports compliance with his medication. He is not always compliant with his diet.  Followup hyperlipidemia. The patient is compliant with his medication, not having any problems. Does need a refill and he needs to have his levels checked.  Patient Active Problem List  Diagnosis  . OSA (obstructive sleep apnea)  . DIABETES MELLITUS, TYPE II  . HYPERCHOLESTEROLEMIA, PURE  . OBESITY  . CONJUNCTIVITIS, ALLERGIC  . SLEEP APNEA  . FATIGUE  . Dyspnea  . Left upper quadrant pain  . ?ADD (attention deficit disorder)  . Arm pain, right  . Tension headache    Past Medical History  Diagnosis Date  . Unspecified sleep apnea   . Obesity, unspecified   . Diabetes mellitus   . History of rosacea   . Dyslipidemia   . Diabetes mellitus   . OSA (obstructive sleep apnea)     Past Surgical History  Procedure Laterality Date  . Nasoplasty  08-2003    History   Social History  . Marital Status: Married    Spouse Name: N/A    Number of Children: 1  . 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 Smoking status: Never Smoker   . Smokeless tobacco: Never Used  . Alcohol Use: Yes  . Drug Use: No  . Sexually Active: Not on file   Other Topics Concern  . Not on file   Social History Narrative   ** Merged History Encounter **       Daily Caffeine Use 2-3 cups per day    Family History  Problem Relation Age of Onset  . Heart disease Mother 64  . Heart disease Paternal Grandfather   . Heart disease Mother   . Diabetes Maternal Grandfather   . Heart disease Paternal Grandmother   . Diabetes Maternal Grandfather     Allergies  Allergen Reactions  . Metformin     REACTION: diarrhea  . Metformin And Related   . Pseudoephedrine   . Septra (Sulfamethoxazole W-Trimethoprim)   . Sulfa Antibiotics     diahrrea   . Sulfamethoxazole W-Trimethoprim     REACTION: red all over and swelling  . Sulfonamide Derivatives     REACTION: swelling \\T \ rash    Medication list has been reviewed and updated.  Outpatient Prescriptions Prior to Visit  Medication Sig Dispense Refill  . aspirin 81 MG tablet Take 81 mg by mouth daily.        Marland Kitchen glucose blood test strip 1 each  by Other route 2 (two) times daily. Use as instructed (freestyle)       . METROGEL 1 % gel Apply 1 application topically daily.       . pioglitazone (ACTOS) 30 MG tablet Take 1 tablet (30 mg total) by mouth daily.  90 tablet  3  . pravastatin (PRAVACHOL) 20 MG tablet Take 20 mg by mouth daily.      Marland Kitchen aspirin 81 MG tablet Take 81 mg by mouth daily.      . ORACEA 40 MG capsule       . pioglitazone (ACTOS) 30 MG tablet Take 30 mg by mouth daily.      . pioglitazone (ACTOS) 30 MG tablet TAKE 1 TABLET BY MOUTH EVERY DAY  90 tablet  0  . pravastatin (PRAVACHOL) 20 MG tablet Take 1 tablet (20 mg total) by mouth every evening.  30 tablet  6   No facility-administered medications prior to visit.    Review of Systems:   GEN: No acute illnesses, no fevers, chills. GI: No n/v/d, eating  normally Pulm: No SOB Interactive and getting along well at home.  Otherwise, ROS is as per the HPI.   Physical Examination: BP 112/60  Pulse 87  Temp(Src) 98.3 F (36.8 C) (Oral)  Wt 209 lb (94.802 kg)  BMI 37.03 kg/m2  SpO2 98%  Ideal Body Weight:     GEN: WDWN, NAD, Non-toxic, A & O x 3 HEENT: Atraumatic, Normocephalic. Neck supple. No masses, No LAD. Ears and Nose: No external deformity. CV: RRR, No M/G/R. No JVD. No thrill. No extra heart sounds. PULM: CTA B, no wheezes, crackles, rhonchi. No retractions. No resp. distress. No accessory muscle use. EXTR: No c/c/e NEURO Normal gait.  PSYCH: Normally interactive. Conversant. Not depressed or anxious appearing.  Calm demeanor.   GU: normal male. Testes are nontender. LEFT epididymis slightly proximal is tender to palpation  Assessment and Plan:  Testicle pain  DIABETES MELLITUS, TYPE II - Plan: Hemoglobin A1c  HYPERCHOLESTEROLEMIA, PURE - Plan: LDL cholesterol, direct  By exam, this is most consistent with epididymitis. I'm going to treat him with antibiotics.  Check diabetes and lipid labs.  It for some reason his testicular problems or not improved after this course of antibiotics, I have asked him to call me in 14 days. At that point I want to get the opinion of urology.  Orders Today:  Orders Placed This Encounter  Procedures  . Hemoglobin A1c  . LDL cholesterol, direct    Updated Medication List: (Includes new medications, updates to list, dose adjustments) Meds ordered this encounter  Medications  . amoxicillin-clavulanate (AUGMENTIN) 875-125 MG per tablet    Sig: Take 1 tablet by mouth 2 (two) times daily.    Dispense:  20 tablet    Refill:  0  . pravastatin (PRAVACHOL) 20 MG tablet    Sig: Take 1 tablet (20 mg total) by mouth daily.    Dispense:  90 tablet    Refill:  3  . pioglitazone (ACTOS) 30 MG tablet    Sig: Take 1 tablet (30 mg total) by mouth daily.    Dispense:  90 tablet    Refill:  3     Medications Discontinued: Medications Discontinued During This Encounter  Medication Reason  . aspirin 81 MG tablet Duplicate  . ORACEA 40 MG capsule Duplicate  . pioglitazone (ACTOS) 30 MG tablet Duplicate  . pioglitazone (ACTOS) 30 MG tablet Duplicate  . pravastatin (PRAVACHOL) 20 MG tablet Duplicate  .  pravastatin (PRAVACHOL) 20 MG tablet Reorder  . pioglitazone (ACTOS) 30 MG tablet Reorder      Signed, Karleen Hampshire T. Kyran Whittier, MD 06/26/2012 3:46 PM

## 2012-06-27 LAB — LDL CHOLESTEROL, DIRECT: Direct LDL: 138.7 mg/dL

## 2012-06-27 LAB — HEMOGLOBIN A1C: Hgb A1c MFr Bld: 6.3 % (ref 4.6–6.5)

## 2012-06-30 MED ORDER — ROSUVASTATIN CALCIUM 20 MG PO TABS: 20.0000 mg | ORAL_TABLET | Freq: Every day | ORAL | Status: DC

## 2012-06-30 NOTE — Addendum Note (Signed)
Addended by: Consuello Masse on: 06/30/2012 10:28 AM   Modules accepted: Orders

## 2012-10-09 ENCOUNTER — Telehealth: Payer: Self-pay

## 2012-10-09 MED ORDER — PIOGLITAZONE HCL 30 MG PO TABS: 30.0000 mg | ORAL_TABLET | Freq: Every day | ORAL | Status: DC

## 2012-10-09 NOTE — Telephone Encounter (Signed)
[facility] request Actos rx; pt has never gotten med there before; [facility] does not have refill for Actos; 06/26/12 pt got rx for Actos. Left v/m for pt to call office.

## 2012-10-09 NOTE — Telephone Encounter (Signed)
Pt called back and said if given Actos rx at 06/26/12 probably threw away with [facility] papers.Medication phoned to [facility] pharmacy as instructed.pt notified.

## 2012-10-13 ENCOUNTER — Other Ambulatory Visit: Payer: Self-pay | Admitting: Family Medicine

## 2013-05-04 ENCOUNTER — Other Ambulatory Visit: Payer: Self-pay | Admitting: Family Medicine

## 2013-05-04 NOTE — Telephone Encounter (Signed)
Last office visit 06/16/12.   Ok to refill?

## 2013-05-06 ENCOUNTER — Telehealth: Payer: Self-pay

## 2013-05-06 MED ORDER — ONETOUCH ULTRA MINI W/DEVICE KIT: PACK | Status: DC

## 2013-05-06 MED ORDER — GLUCOSE BLOOD VI STRP: ORAL_STRIP | Status: DC

## 2013-05-06 NOTE — Telephone Encounter (Signed)
Pt left v/m requesting test strips to [facility]; pts mail box is full and cannot leave message for pt to find out what type test strips are needed. [facility] said has never filled before.

## 2013-05-06 NOTE — Telephone Encounter (Signed)
Pt called back and needs new glucose meter and pt request one touch ultra mini device and one touch ultra test strips; [facility] and pt will cb for appt.

## 2013-10-14 ENCOUNTER — Telehealth: Payer: Self-pay | Admitting: Family Medicine

## 2013-10-14 NOTE — Telephone Encounter (Signed)
His last visit was actually 06/26/2012.  Still want to wait until October?

## 2013-10-14 NOTE — Telephone Encounter (Signed)
Appointment 8/6 pt aware °

## 2013-10-14 NOTE — Telephone Encounter (Signed)
i last saw him 06/26/2013. Stable then.  He should not be out of meds??  Unless he is having an emergency 6 months post last appt is more appropriate. I would keep 01/06/2014.  Any DM, HTN, Lipid meds - please refill enough if needed until his appt in 12/2013

## 2013-10-14 NOTE — Telephone Encounter (Signed)
Pt called to schedule his cpx.  He stated he was almost out of his diabetic and chol meds  And would like to get his a1c checked We made appointment for 01/06/14.  Mr Tyler Hoffman wanted to know if he could get his cpx sooner than 10/28 Please advsie

## 2013-10-14 NOTE — Telephone Encounter (Signed)
Error - sooner.   30 min CPX

## 2013-10-14 NOTE — Telephone Encounter (Signed)
Robin, Please schedule [name] in a 30 minute slot with Dr. Patsy Lageropland.  He will also need fasting labs prior.   Lupita Leashonna

## 2013-10-15 ENCOUNTER — Encounter: Payer: Self-pay | Admitting: Family Medicine

## 2013-10-15 ENCOUNTER — Ambulatory Visit (INDEPENDENT_AMBULATORY_CARE_PROVIDER_SITE_OTHER): Payer: BC Managed Care – PPO | Admitting: Family Medicine

## 2013-10-15 VITALS — BP 100/70 | HR 79 | Temp 98.1°F | Ht 63.5 in | Wt 213.5 lb

## 2013-10-15 DIAGNOSIS — Z79899 Other long term (current) drug therapy: Secondary | ICD-10-CM

## 2013-10-15 DIAGNOSIS — E669 Obesity, unspecified: Secondary | ICD-10-CM

## 2013-10-15 DIAGNOSIS — IMO0002 Reserved for concepts with insufficient information to code with codable children: Secondary | ICD-10-CM

## 2013-10-15 DIAGNOSIS — Z0000 Encounter for general adult medical examination without abnormal findings: Secondary | ICD-10-CM

## 2013-10-15 DIAGNOSIS — E785 Hyperlipidemia, unspecified: Secondary | ICD-10-CM

## 2013-10-15 DIAGNOSIS — E1165 Type 2 diabetes mellitus with hyperglycemia: Secondary | ICD-10-CM

## 2013-10-15 DIAGNOSIS — IMO0001 Reserved for inherently not codable concepts without codable children: Secondary | ICD-10-CM

## 2013-10-15 NOTE — Progress Notes (Signed)
[place] [address]                Phone: [phone]                  Fax: [phone] 10/15/2013  ID: [name]   MRN: [id number]  DOB: [birth year]  Primary Physician:  Owens Loffler, MD  Chief Complaint: Annual Exam  Subjective:   History of Present Illness:  Tyler Hoffman is a 38 y.o. pleasant patient who presents with the following:  Preventative Health Maintenance Visit:  Health Maintenance Summary Reviewed and updated, unless pt declines services.  Tobacco History Reviewed. Alcohol: No concerns, no excessive use Exercise Habits: none, rec at least 30 mins 5 times a week STD concerns: no risk or activity to increase risk Drug Use: None Encouraged self-testicular check  Health Maintenance  Topic Date Due  . Pneumococcal Polysaccharide Vaccine (##1) 09/14/1976  . Ophthalmology Exam  09/14/1984  . Foot Exam  04/25/2010  . Urine Microalbumin  04/08/2012  . Hemoglobin A1c  12/26/2012  . Influenza Vaccine  10/10/2013  . Tetanus/tdap  11/04/2019   Immunization History  Administered Date(s) Administered  . Influenza Whole 02/16/2009  . Td 10/10/1997, 11/03/2009   [employer].  [employer]. [employer].   Taking DM, stopped cholesterol medicine.  Diabetes Mellitus: Tolerating Medications: yes Compliance with diet: fair Exercise: minimal / intermittent Avg blood sugars at home: not checking Foot problems: none Hypoglycemia: none No nausea, vomitting, blurred vision, polyuria.  Lab Results  Component Value Date   HGBA1C 7.1* 10/15/2013   HGBA1C 6.3 06/26/2012   HGBA1C 6.1 04/09/2011   Lab Results  Component Value Date   MICROALBUR 0.2 10/15/2013   LDLCALC 67 08/05/2008   CREATININE 0.9 10/15/2013   Wt Readings from Last 3 Encounters:  10/15/13 213 lb 8 oz (96.843 kg)  06/26/12 209 lb (94.802 kg)  11/05/11 204 lb 6.4 oz (92.715 kg)   Body mass index is 37.22 kg/(m^2).   Lipids: Not taking meds  Panel  reviewed with patient.  Lipids:    Component Value Date/Time   CHOL 283* 12/12/2010 0909   TRIG 154.0* 12/12/2010 0909   HDL 49.80 12/12/2010 0909   LDLDIRECT 210.4 10/15/2013 1831   VLDL 30.8 12/12/2010 0909   CHOLHDL 6 12/12/2010 0909  Ongoing weight issues, eating poorly and no exercise.  Wt Readings from Last 3 Encounters:  10/15/13 213 lb 8 oz (96.843 kg)  06/26/12 209 lb (94.802 kg)  11/05/11 204 lb 6.4 oz (92.715 kg)      Patient Active Problem List   Diagnosis Date Noted  . Obesity (BMI 30-39.9) 10/16/2013  . OSA (obstructive sleep apnea) 11/05/2011  . ?ADD (attention deficit disorder) 07/25/2011  . Diabetes mellitus type 2, controlled, without complications 24/58/0998  . CONJUNCTIVITIS, ALLERGIC 06/04/2008  . SLEEP APNEA 04/14/2007  . OBESITY 01/24/2007  . HYPERCHOLESTEROLEMIA, PURE 08/22/2006  . FATIGUE 08/16/2006   Past Medical History  Diagnosis Date  . Unspecified sleep apnea   . Obesity, unspecified   . Diabetes mellitus   . History of rosacea   . Dyslipidemia   . Diabetes mellitus   . OSA (obstructive sleep apnea)    Past Surgical History  Procedure Laterality Date  . Nasoplasty  08-2003   History   Social History  . Marital Status: Married  Spouse Name: N/A    Number of Children: 1  . 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 Smoking status: Never Smoker   . Smokeless tobacco: Never Used  . Alcohol Use: Yes  . Drug Use: No  . Sexual Activity: Not on file   Other Topics Concern  . Not on file   Social History Narrative   ** Merged History Encounter **       Daily Caffeine Use 2-3 cups per day   Family History  Problem Relation Age of Onset  . Heart disease Mother 63  . Heart disease Paternal Grandfather   . Heart disease Mother   . Diabetes Maternal Grandfather   . Heart disease Paternal Grandmother   . Diabetes Maternal Grandfather    Allergies    Allergen Reactions  . Metformin     REACTION: diarrhea  . Metformin And Related   . Pseudoephedrine   . Septra [Sulfamethoxazole-Trimethoprim]   . Sulfa Antibiotics     diahrrea   . Sulfamethoxazole-Trimethoprim     REACTION: red all over and swelling  . Sulfonamide Derivatives     REACTION: swelling \T_0 rash    Medication list has been reviewed and updated.  Review of Systems:  General: Denies fever, chills, sweats. No significant weight loss. Eyes: Denies blurring,significant itching ENT: Denies earache, sore throat, and hoarseness. Cardiovascular: Denies chest pains, palpitations, dyspnea on exertion Respiratory: Denies cough, dyspnea at rest,wheeezing Breast: no concerns about lumps GI: Denies nausea, vomiting, diarrhea, constipation, change in bowel habits, abdominal pain, melena, hematochezia GU: Denies penile discharge, ED, urinary flow / outflow problems. No STD concerns. Musculoskeletal: Denies back pain, joint pain Derm: Denies rash, itching Neuro: Denies  paresthesias, frequent falls, frequent headaches Psych: Denies depression, anxiety Endocrine: Denies cold intolerance, heat intolerance, polydipsia Heme: Denies enlarged lymph nodes Allergy: No hayfever  Objective:   Physical Examination: BP 100/70  Pulse 79  Temp(Src) 98.1 F (36.7 C) (Oral)  Ht 5' 3.5" (1.613 m)  Wt 213 lb 8 oz (96.843 kg)  BMI 37.22 kg/m2 Ideal Body Weight: Weight in (lb) to have BMI = 25: 143.1  No exam data present  GEN: well developed, well nourished, no acute distress Eyes: conjunctiva and lids normal, PERRLA, EOMI ENT: TM clear, nares clear, oral exam WNL Neck: supple, no lymphadenopathy, no thyromegaly, no JVD Pulm: clear to auscultation and percussion, respiratory effort normal CV: regular rate and rhythm, S1-S2, no murmur, rub or gallop, no bruits, peripheral pulses normal and symmetric, no cyanosis, clubbing, edema or varicosities GI: soft, non-tender; no  hepatosplenomegaly, masses; active bowel sounds all quadrants GU: defer Lymph: no cervical, axillary or inguinal adenopathy MSK: gait normal, muscle tone and strength WNL, no joint swelling, effusions, discoloration, crepitus  SKIN: clear, good turgor, color WNL, no rashes, lesions, or ulcerations Neuro: normal mental status, normal strength, sensation, and motion Psych: alert; oriented to person, place and time, normally interactive and not anxious or depressed in appearance.  All labs reviewed with patient.  Lipids:    Component Value Date/Time   CHOL 283* 12/12/2010 0909   TRIG 154.0* 12/12/2010 0909   HDL 49.80 12/12/2010 0909   LDLDIRECT 210.4 10/15/2013 1831   VLDL 30.8 12/12/2010 0909   CHOLHDL 6 12/12/2010 0909   CBC: CBC Latest Ref Rng 10/15/2013 07/20/2011 04/09/2011  WBC 4.0 - 10.5 K/uL 9.0 7.1 7.4  Hemoglobin 13.0 -  17.0 g/dL 15.7 15.5 15.3  Hematocrit 39.0 - 52.0 % 46.0 45.6 44.4  Platelets 150.0 - 400.0 K/uL 270.0 221.0 253.6    Basic Metabolic Panel:    Component Value Date/Time   NA 138 10/15/2013 1831   K 4.3 10/15/2013 1831   CL 101 10/15/2013 1831   CO2 28 10/15/2013 1831   BUN 17 10/15/2013 1831   CREATININE 0.9 10/15/2013 1831   GLUCOSE 100* 10/15/2013 1831   CALCIUM 9.0 10/15/2013 1831   Hepatic Function Latest Ref Rng 10/15/2013 04/09/2011 12/12/2010  Total Protein 6.0 - 8.3 g/dL 7.3 7.4 7.7  Albumin 3.5 - 5.2 g/dL 4.2 4.4 4.4  AST 0 - 37 U/L 31 24 35  ALT 0 - 53 U/L 37 26 29  Alk Phosphatase 39 - 117 U/L 74 77 72  Total Bilirubin 0.2 - 1.2 mg/dL 0.5 0.3 1.1  Bilirubin, Direct 0.0 - 0.3 mg/dL 0.1 0.0 0.1    Lab Results  Component Value Date   TSH 2.52 04/25/2009   No results found for this basename: PSA    Assessment & Plan:   Routine general medical examination at a health care facility  Diabetes type 2, uncontrolled - Plan: Hemoglobin A1c, Microalbumin / creatinine urine ratio: check labs, stable, work on diet and exercise, refill actos.   Other and unspecified  hyperlipidemia - Plan: LDL cholesterol, direct: 210 LDL and increase to Pravachol 80 mg.   Encounter for long-term (current) use of other medications - Plan: Basic metabolic panel, CBC with Differential, Hepatic function panel  OBESITY: Work on your diet: Try to eat minimal fatty food and eat more fruits and vegetables. Anything fried is bad, cream, beef and pork fat are bad.  Exercise is incredibly important to heart health. Try to exercise for at least 30 minutes 5 - 6 times a week   Health Maintenance Exam: The patient's preventative maintenance and recommended screening tests for an annual wellness exam were reviewed in full today. Brought up to date unless services declined.  Counselled on the importance of diet, exercise, and its role in overall health and mortality. The patient's FH and SH was reviewed, including their home life, tobacco status, and drug and alcohol status.  Follow-up: Return in about 6 months (around 04/17/2014). Unless noted, follow-up in 1 year for Health Maintenance Exam.  New Prescriptions   No medications on file   Discontinued Medications   AMOXICILLIN-CLAVULANATE (AUGMENTIN) 875-125 MG PER TABLET    Take 1 tablet by mouth 2 (two) times daily.   GLUCOSE BLOOD TEST STRIP    1 each by Other route 2 (two) times daily. Use as instructed (freestyle)    METROGEL 1 % GEL    Apply 1 application topically daily.    ROSUVASTATIN (CRESTOR) 20 MG TABLET    Take 1 tablet (20 mg total) by mouth daily.   Modified Medications   Modified Medication Previous Medication   PIOGLITAZONE (ACTOS) 30 MG TABLET pioglitazone (ACTOS) 30 MG tablet      TAKE 1 TABLET BY MOUTH DAILY    TAKE 1 TABLET BY MOUTH DAILY   PRAVASTATIN (PRAVACHOL) 80 MG TABLET pravastatin (PRAVACHOL) 20 MG tablet      Take 1 tablet (80 mg total) by mouth daily.    Take 1 tablet (20 mg total) by mouth daily.    Signed,  Maud Deed. Micky Overturf, MD, [facility]  Orders Placed This Encounter    Procedures  . Basic metabolic panel  . CBC with Differential  .  Hepatic function panel  . LDL cholesterol, direct  . Hemoglobin A1c  . Microalbumin / creatinine urine ratio   Current Medications at Discharge:   Medication List       This list is accurate as of: 10/15/13 11:59 PM.  Always use your most recent med list.               aspirin 81 MG tablet  Take 81 mg by mouth daily.     glucose blood test strip  Commonly known as:  ONE TOUCH ULTRA TEST  Ck blood sugar once daily and as directed. Dx 250.00     ONE TOUCH ULTRA MINI W/DEVICE Kit  Check blood sugar once daily and as directed. Dx 250.00     pioglitazone 30 MG tablet  Commonly known as:  ACTOS  TAKE 1 TABLET BY MOUTH DAILY     pravastatin 20 MG tablet  Commonly known as:  PRAVACHOL  Take 1 tablet (20 mg total) by mouth daily.

## 2013-10-15 NOTE — Progress Notes (Signed)
Pre visit review using our clinic review tool, if applicable. No additional management support is needed unless otherwise documented below in the visit note. 

## 2013-10-16 ENCOUNTER — Encounter: Payer: Self-pay | Admitting: Family Medicine

## 2013-10-16 DIAGNOSIS — E669 Obesity, unspecified: Secondary | ICD-10-CM

## 2013-10-16 HISTORY — DX: Obesity, unspecified: E66.9

## 2013-10-16 LAB — HEPATIC FUNCTION PANEL
ALBUMIN: 4.2 g/dL (ref 3.5–5.2)
ALT: 37 U/L (ref 0–53)
AST: 31 U/L (ref 0–37)
Alkaline Phosphatase: 74 U/L (ref 39–117)
Bilirubin, Direct: 0.1 mg/dL (ref 0.0–0.3)
TOTAL PROTEIN: 7.3 g/dL (ref 6.0–8.3)
Total Bilirubin: 0.5 mg/dL (ref 0.2–1.2)

## 2013-10-16 LAB — CBC WITH DIFFERENTIAL/PLATELET
BASOS ABS: 0.1 10*3/uL (ref 0.0–0.1)
BASOS PCT: 0.6 % (ref 0.0–3.0)
EOS ABS: 0.2 10*3/uL (ref 0.0–0.7)
Eosinophils Relative: 1.8 % (ref 0.0–5.0)
HCT: 46 % (ref 39.0–52.0)
HEMOGLOBIN: 15.7 g/dL (ref 13.0–17.0)
LYMPHS ABS: 3.1 10*3/uL (ref 0.7–4.0)
LYMPHS PCT: 34.6 % (ref 12.0–46.0)
MCHC: 34.1 g/dL (ref 30.0–36.0)
MCV: 96.8 fl (ref 78.0–100.0)
MONO ABS: 0.4 10*3/uL (ref 0.1–1.0)
Monocytes Relative: 4.1 % (ref 3.0–12.0)
NEUTROS ABS: 5.3 10*3/uL (ref 1.4–7.7)
Neutrophils Relative %: 58.9 % (ref 43.0–77.0)
Platelets: 270 10*3/uL (ref 150.0–400.0)
RBC: 4.75 Mil/uL (ref 4.22–5.81)
RDW: 13.2 % (ref 11.5–15.5)
WBC: 9 10*3/uL (ref 4.0–10.5)

## 2013-10-16 LAB — BASIC METABOLIC PANEL
BUN: 17 mg/dL (ref 6–23)
CALCIUM: 9 mg/dL (ref 8.4–10.5)
CHLORIDE: 101 meq/L (ref 96–112)
CO2: 28 meq/L (ref 19–32)
Creatinine, Ser: 0.9 mg/dL (ref 0.4–1.5)
GFR: 98.54 mL/min (ref >60.00)
GLUCOSE: 100 mg/dL — AB (ref 70–99)
Potassium: 4.3 mEq/L (ref 3.5–5.1)
SODIUM: 138 meq/L (ref 135–145)

## 2013-10-16 LAB — MICROALBUMIN / CREATININE URINE RATIO
CREATININE, U: 193.8 mg/dL
Microalb Creat Ratio: 0.1 mg/g (ref 0.0–30.0)
Microalb, Ur: 0.2 mg/dL (ref 0.0–1.9)

## 2013-10-16 LAB — HEMOGLOBIN A1C: Hgb A1c MFr Bld: 7.1 % — ABNORMAL HIGH (ref 4.6–6.5)

## 2013-10-16 LAB — LDL CHOLESTEROL, DIRECT: Direct LDL: 210.4 mg/dL

## 2013-10-16 MED ORDER — PIOGLITAZONE HCL 30 MG PO TABS: ORAL_TABLET | ORAL | Status: DC

## 2013-10-16 MED ORDER — PRAVASTATIN SODIUM 80 MG PO TABS: 80.0000 mg | ORAL_TABLET | Freq: Every day | ORAL | Status: DC

## 2014-01-06 ENCOUNTER — Encounter: Payer: BC Managed Care – PPO | Admitting: Family Medicine

## 2014-01-07 ENCOUNTER — Encounter: Payer: BC Managed Care – PPO | Admitting: Family Medicine

## 2014-01-25 IMAGING — CT CT ABD-PELV W/ CM
2 of 7 series · 15 of 46 positions shown, 19 images · IV contrast (Omnipaque 300)
Comparison: None

CLINICAL DATA: Left upper quadrant pain for several months

CT ABDOMEN AND PELVIS WITH CONTRAST
TECHNIQUE: Multidetector CT imaging of the abdomen and pelvis was
performed using the standard protocol following bolus
administration of intravenous contrast.
Contrast: 100mL OMNIPAQUE IOHEXOL 300 MG/ML  SOLN 100 ml of omni
300

[Series 2: abd/ pel 5mm · axial · 0.70mm/px · z∈[-350,+15]mm · 12 of 83 slices shown, 16 images]
[im 5/83  soft-tissue]
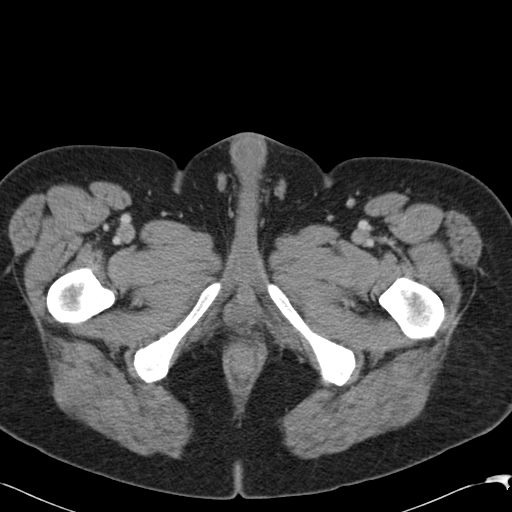
[im 5/83  bone]
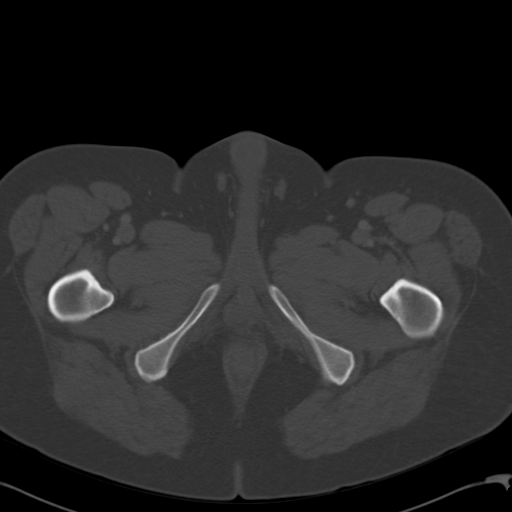
[im 13/83  soft-tissue]
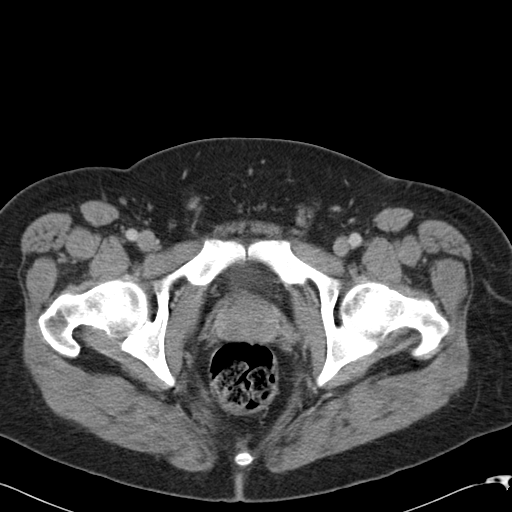
[im 21/83  soft-tissue]
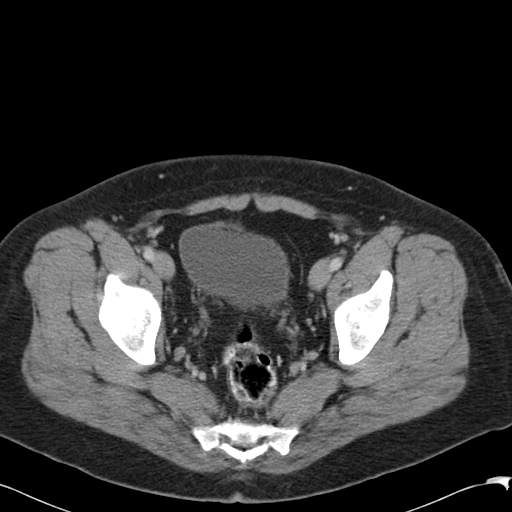
[im 29/83  soft-tissue]
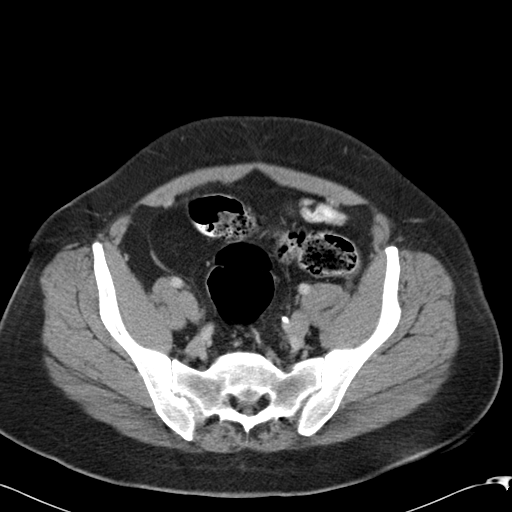
[im 37/83  soft-tissue]
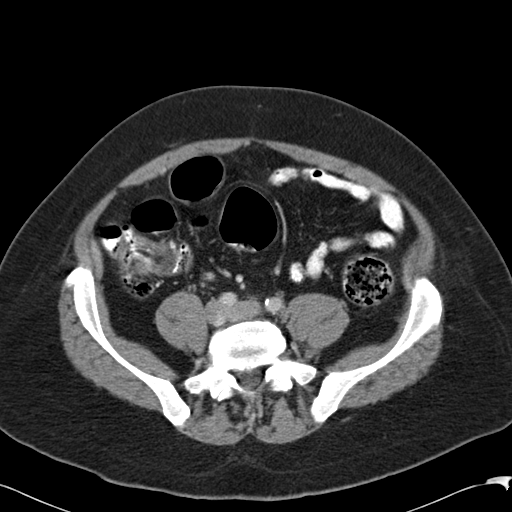
[im 46/83  soft-tissue]
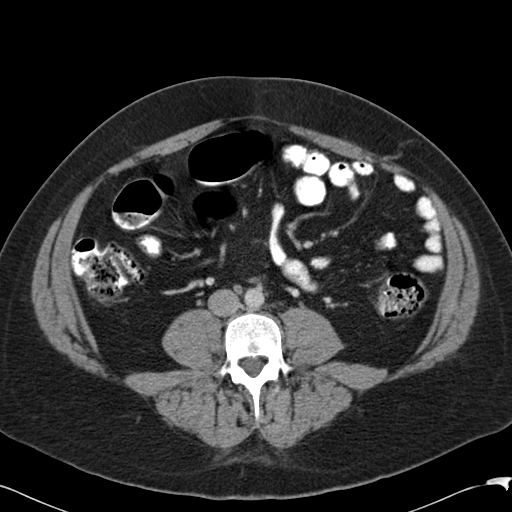
[im 54/83  soft-tissue]
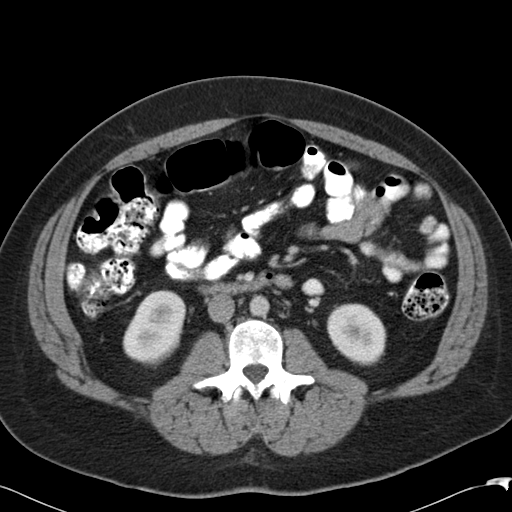
[im 62/83  soft-tissue]
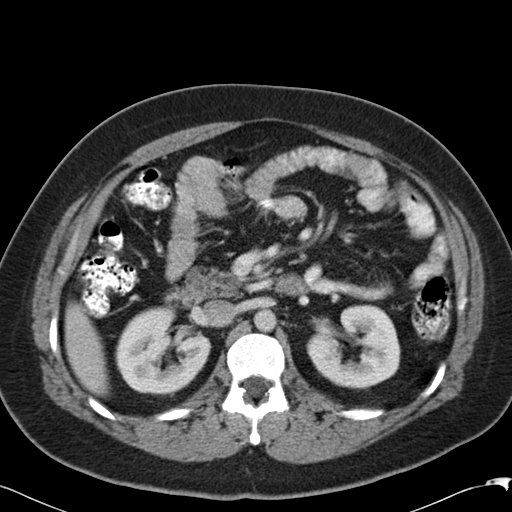
[im 66/83  lung]
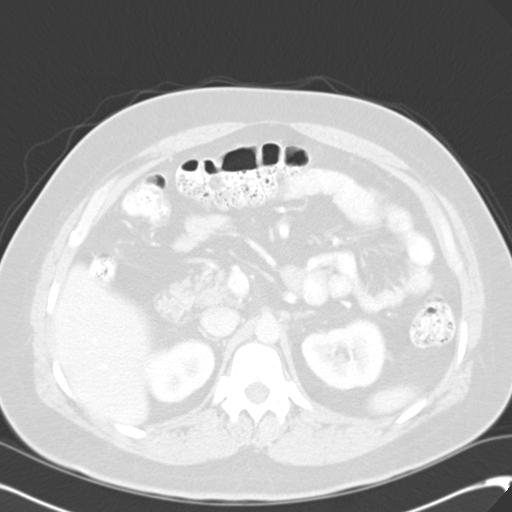
[im 70/83  soft-tissue]
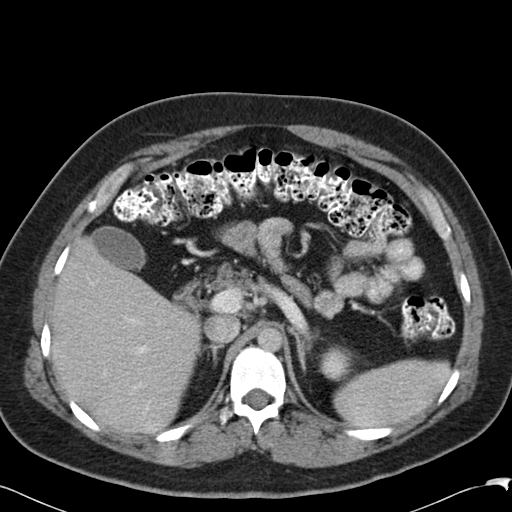
[im 70/83  lung]
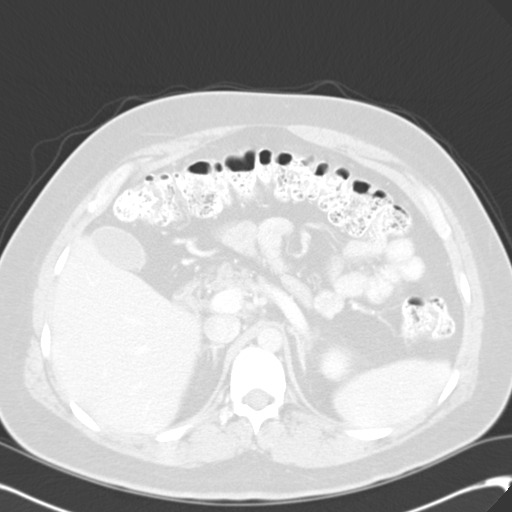
[im 70/83  bone]
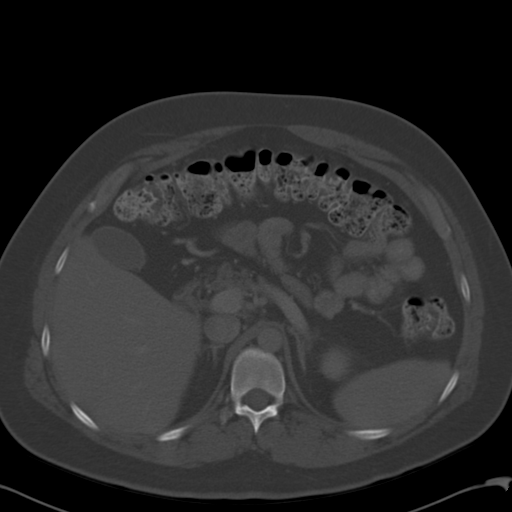
[im 74/83  lung]
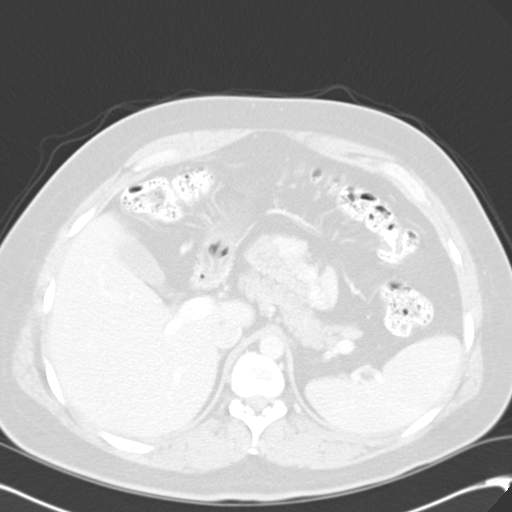
[im 78/83  soft-tissue]
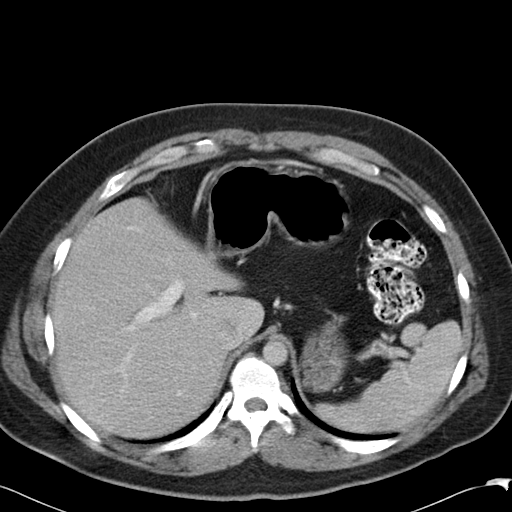
[im 78/83  lung]
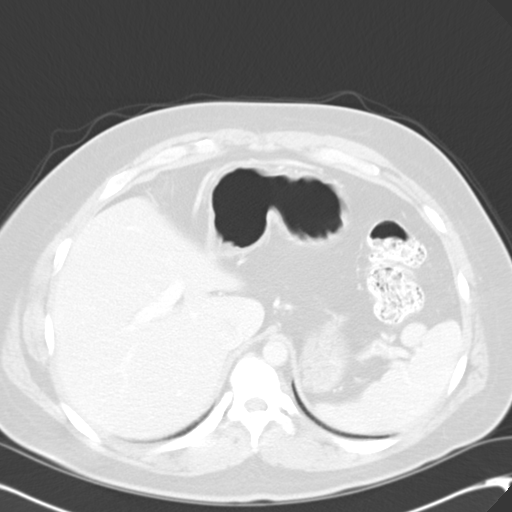

[Series 602: cor repeat · coronal · 0.70mm/px · 3 of 114 slices shown]
[im 29/114  soft-tissue]
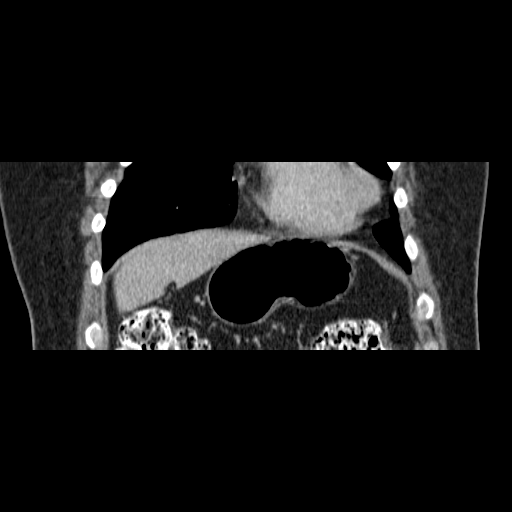
[im 57/114  soft-tissue]
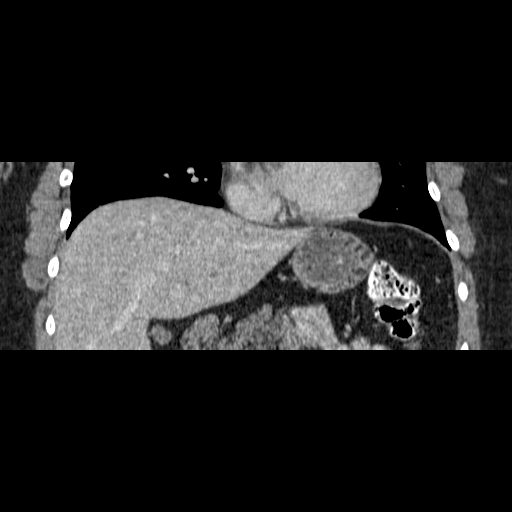
[im 85/114  soft-tissue]
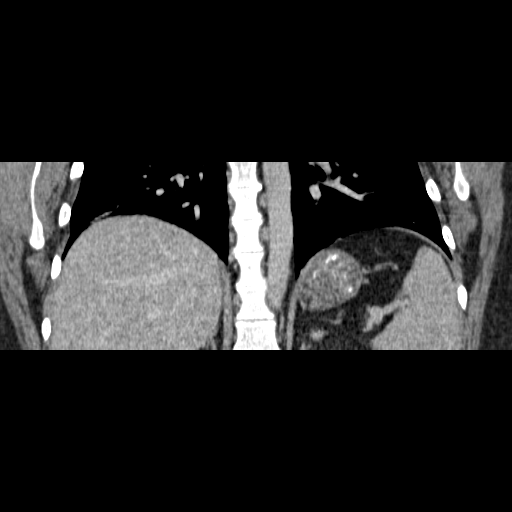

[15 of 46 positions shown; findings below may reference images not displayed]

FINDINGS: The lung bases are clear.

There is no focal liver abnormality.  The spleen appears within
normal limits.   The gallbladder negative.  No biliary dilatation.
The pancreas is unremarkable.

The adrenal glands are both normal. Normal appearance of the right
kidney.  The left kidney is also normal.  Urinary bladder appears
unremarkable.  The prostate gland and seminal vesicles appear
normal.

No upper abdominal adenopathy.  There is no pelvic or inguinal
adenopathy.

The stomach and the small bowel loops have a normal caliber.  No
evidence for obstruction.  The appendix is identified and appears
normal.  Normal appearance of the colon.

Review of the visualized osseous structures is significant for mild
degenerative disc disease within the lower lumbar spine.
IMPRESSION: 1.  No acute findings.
2.  No mass or adenopathy.

## 2014-04-23 ENCOUNTER — Telehealth: Payer: Self-pay | Admitting: Family Medicine

## 2014-04-23 MED ORDER — ERYTHROMYCIN 5 MG/GM OP OINT: TOPICAL_OINTMENT | OPHTHALMIC | Status: DC

## 2014-04-23 NOTE — Telephone Encounter (Signed)
Pink eye is viral and most likely will resolve on its own with time. If he needs bacterial treatment to return to work.Marland Kitchen.Marland Kitchen.Given allergy to sulfa..  Will send in erythromycin ointment.  Verify that he in NOT a contact lens wearer. Let me know if he is.

## 2014-04-23 NOTE — Telephone Encounter (Signed)
Mr. Chauncey ReadingGann notified as instructed by telephone.  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  He does not wear contacts.  [facility].

## 2014-04-23 NOTE — Addendum Note (Signed)
Addended by: Kerby NoraBEDSOLE, AMY E on: 04/23/2014 12:44 PM   Modules accepted: Orders

## 2014-04-23 NOTE — Telephone Encounter (Signed)
Patient called and said his daughter was diagnosed by her pediatrician with pink eye.  Patient's eyes are red,discharge,pain.  Patient wants to know if he can have something called in to the pharmacy for pink eye.  [facility]

## 2014-04-23 NOTE — Telephone Encounter (Signed)
Sent!

## 2014-04-29 ENCOUNTER — Encounter: Payer: Self-pay | Admitting: Family Medicine

## 2014-04-29 ENCOUNTER — Ambulatory Visit (INDEPENDENT_AMBULATORY_CARE_PROVIDER_SITE_OTHER): Payer: BC Managed Care – PPO | Admitting: Family Medicine

## 2014-04-29 VITALS — BP 131/81 | HR 82 | Temp 98.2°F | Ht 63.5 in | Wt 210.2 lb

## 2014-04-29 DIAGNOSIS — E119 Type 2 diabetes mellitus without complications: Secondary | ICD-10-CM

## 2014-04-29 DIAGNOSIS — I152 Hypertension secondary to endocrine disorders: Secondary | ICD-10-CM | POA: Insufficient documentation

## 2014-04-29 DIAGNOSIS — R0789 Other chest pain: Secondary | ICD-10-CM | POA: Insufficient documentation

## 2014-04-29 DIAGNOSIS — E1159 Type 2 diabetes mellitus with other circulatory complications: Secondary | ICD-10-CM | POA: Insufficient documentation

## 2014-04-29 DIAGNOSIS — Z8249 Family history of ischemic heart disease and other diseases of the circulatory system: Secondary | ICD-10-CM

## 2014-04-29 DIAGNOSIS — I10 Essential (primary) hypertension: Secondary | ICD-10-CM

## 2014-04-29 DIAGNOSIS — R030 Elevated blood-pressure reading, without diagnosis of hypertension: Secondary | ICD-10-CM

## 2014-04-29 MED ORDER — HYDROCHLOROTHIAZIDE 25 MG PO TABS: 25.0000 mg | ORAL_TABLET | Freq: Every day | ORAL | Status: DC

## 2014-04-29 NOTE — Progress Notes (Signed)
Pre visit review using our clinic review tool, if applicable. No additional management support is needed unless otherwise documented below in the visit note. 

## 2014-04-29 NOTE — Patient Instructions (Addendum)
Return for fasting labs. EKG normal. Start HCTZ daily.  Follow BP at home. Keep a record.  Work on stress reduction, relaxation, stress control. Follow up wih PCP in 2 weeks for BP check.

## 2014-04-29 NOTE — Assessment & Plan Note (Addendum)
Nml here elevated at work. Will eval with labs to rule out end organ damage, secondary cause, EKG nml.  Start on HCTZ daily to lower BP, monitor at home.  Work on stress reduction and relaxation as likely huge contributor.  Monitor risk factors, work on healthy eating weight loss.

## 2014-04-29 NOTE — Assessment & Plan Note (Addendum)
EKG: NSR, no ST changes, stable from previous. Most likely due to stress, anxiety.  Strong family history of premature CAD in mother.  If continued consider stress test.

## 2014-04-29 NOTE — Assessment & Plan Note (Signed)
Due for re-eval. 

## 2014-04-29 NOTE — Progress Notes (Signed)
   Subjective:    Patient ID: Tyler Hoffman, male    DOB: 05/31/1975, 39 y.o.   MRN: [id number]  HPI  39 year old male pt  With DM on actosof Dr. Patsy Lageropland presents for  elevated blood pressures in last few weeks.  He had noted headaches (unusual for him), occ  intermittent chest pain (at rest in a stressful time, no exertional symptoms,  No SOB, occ palpitations but only when in stressful situations) BP 148/108 to 160/120 HR 115.   He has been under more stress lately.. 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 Usually sleeping well.  Family history of CAD age 40, stent placed.   BP Readings from Last 3 Encounters:  04/29/14 131/81  10/15/13 100/70  06/26/12 112/60   Due for CPX and A1C chol check.    Wt Readings from Last 3 Encounters:  04/29/14 210 lb 4 oz (95.369 kg)  10/15/13 213 lb 8 oz (96.843 kg)  06/26/12 209 lb (94.802 kg)      Review of Systems  Constitutional: Negative for fever and fatigue.  HENT: Negative for ear pain.   Eyes: Negative for pain.  Respiratory: Negative for cough and shortness of breath.   Cardiovascular: Negative for leg swelling.       Objective:   Physical Exam  Constitutional: Vital signs are normal. He appears well-developed and well-nourished.  obesity  HENT:  Head: Normocephalic.  Right Ear: Hearing normal.  Left Ear: Hearing normal.  Nose: Nose normal.  Mouth/Throat: Oropharynx is clear and moist and mucous membranes are normal.  Neck: Trachea normal. Carotid bruit is not present. No thyroid mass and no thyromegaly present.  Cardiovascular: Normal rate, regular rhythm and normal pulses.  Exam reveals no gallop, no distant heart sounds and no friction rub.   No murmur heard. No peripheral edema  Pulmonary/Chest: Effort normal and breath sounds normal. No respiratory distress.  Neurological:  No papilledema  Skin: Skin is warm, dry and intact. No rash noted.  Psychiatric: He has a normal mood and affect. His  speech is normal and behavior is normal. Thought content normal.          Assessment & Plan:

## 2014-05-01 ENCOUNTER — Telehealth: Payer: Self-pay | Admitting: Family Medicine

## 2014-05-01 NOTE — Telephone Encounter (Signed)
emmi emailed °

## 2014-05-03 ENCOUNTER — Other Ambulatory Visit: Payer: BC Managed Care – PPO

## 2014-05-04 ENCOUNTER — Other Ambulatory Visit (INDEPENDENT_AMBULATORY_CARE_PROVIDER_SITE_OTHER): Payer: BC Managed Care – PPO

## 2014-05-04 DIAGNOSIS — R0789 Other chest pain: Secondary | ICD-10-CM

## 2014-05-04 DIAGNOSIS — R030 Elevated blood-pressure reading, without diagnosis of hypertension: Secondary | ICD-10-CM

## 2014-05-04 LAB — COMPREHENSIVE METABOLIC PANEL
ALBUMIN: 4.1 g/dL (ref 3.5–5.2)
ALT: 30 U/L (ref 0–53)
AST: 22 U/L (ref 0–37)
Alkaline Phosphatase: 76 U/L (ref 39–117)
BUN: 19 mg/dL (ref 6–23)
CO2: 30 meq/L (ref 19–32)
Calcium: 9.3 mg/dL (ref 8.4–10.5)
Chloride: 101 mEq/L (ref 96–112)
Creatinine, Ser: 0.89 mg/dL (ref 0.40–1.50)
GFR: 100.81 mL/min (ref >60.00)
GLUCOSE: 147 mg/dL — AB (ref 70–99)
POTASSIUM: 4.5 meq/L (ref 3.5–5.1)
Sodium: 137 mEq/L (ref 135–145)
TOTAL PROTEIN: 6.9 g/dL (ref 6.0–8.3)
Total Bilirubin: 0.5 mg/dL (ref 0.2–1.2)

## 2014-05-04 LAB — LIPID PANEL
CHOL/HDL RATIO: 6
CHOLESTEROL: 251 mg/dL — AB (ref 0–200)
HDL: 39.8 mg/dL (ref >39.00)
LDL CALC: 179 mg/dL — AB (ref 0–99)
NONHDL: 211.2
Triglycerides: 159 mg/dL — ABNORMAL HIGH (ref 0.0–149.0)
VLDL: 31.8 mg/dL (ref 0.0–40.0)

## 2014-05-04 LAB — CBC WITH DIFFERENTIAL/PLATELET
Basophils Absolute: 0 10*3/uL (ref 0.0–0.1)
Basophils Relative: 0.4 % (ref 0.0–3.0)
EOS ABS: 0.1 10*3/uL (ref 0.0–0.7)
Eosinophils Relative: 1.6 % (ref 0.0–5.0)
HCT: 44 % (ref 39.0–52.0)
Hemoglobin: 15.3 g/dL (ref 13.0–17.0)
LYMPHS PCT: 34.1 % (ref 12.0–46.0)
Lymphs Abs: 2.4 10*3/uL (ref 0.7–4.0)
MCHC: 34.8 g/dL (ref 30.0–36.0)
MCV: 94.7 fl (ref 78.0–100.0)
MONO ABS: 0.5 10*3/uL (ref 0.1–1.0)
Monocytes Relative: 7 % (ref 3.0–12.0)
NEUTROS PCT: 56.9 % (ref 43.0–77.0)
Neutro Abs: 4 10*3/uL (ref 1.4–7.7)
Platelets: 260 10*3/uL (ref 150.0–400.0)
RBC: 4.64 Mil/uL (ref 4.22–5.81)
RDW: 13 % (ref 11.5–15.5)
WBC: 7 10*3/uL (ref 4.0–10.5)

## 2014-05-04 LAB — TSH: TSH: 1.9 u[IU]/mL (ref 0.35–4.50)

## 2014-05-04 LAB — HEMOGLOBIN A1C: Hgb A1c MFr Bld: 7.3 % — ABNORMAL HIGH (ref 4.6–6.5)

## 2014-05-21 ENCOUNTER — Telehealth: Payer: Self-pay | Admitting: Family Medicine

## 2014-05-21 ENCOUNTER — Encounter: Payer: Self-pay | Admitting: Family Medicine

## 2014-05-21 ENCOUNTER — Ambulatory Visit (INDEPENDENT_AMBULATORY_CARE_PROVIDER_SITE_OTHER): Payer: BC Managed Care – PPO | Admitting: Family Medicine

## 2014-05-21 VITALS — BP 112/67 | HR 94 | Temp 98.5°F | Ht 63.5 in | Wt 212.8 lb

## 2014-05-21 DIAGNOSIS — E119 Type 2 diabetes mellitus without complications: Secondary | ICD-10-CM

## 2014-05-21 DIAGNOSIS — I10 Essential (primary) hypertension: Secondary | ICD-10-CM

## 2014-05-21 DIAGNOSIS — R0789 Other chest pain: Secondary | ICD-10-CM

## 2014-05-21 DIAGNOSIS — E7800 Pure hypercholesterolemia, unspecified: Secondary | ICD-10-CM

## 2014-05-21 LAB — HM DIABETES FOOT EXAM

## 2014-05-21 MED ORDER — PIOGLITAZONE HCL 45 MG PO TABS: ORAL_TABLET | ORAL | Status: DC

## 2014-05-21 NOTE — Telephone Encounter (Signed)
Yes, he stated his schedule works better with mine than Dr. Renaye Rakers's. Likes Dr. Salena Saner a lot.

## 2014-05-21 NOTE — Assessment & Plan Note (Signed)
IMproved control, but dizziness.. ? Overtreated. Decrease to 12.5 mg daily HCTZ

## 2014-05-21 NOTE — Patient Instructions (Addendum)
Decrease HCTZ to 12.5 mg daily. Follow BP at home .Marland Kitchen. Goal < 140/90 at home and school. Schedule yearly eye exam.  Increase actos to 45 mg daily.  Restart pravastatin. Get back on track with diet and exercise as well as weight loss.  Follow up 3 months DM with PCP with labs prior.

## 2014-05-21 NOTE — Progress Notes (Signed)
Pre visit review using our clinic review tool, if applicable. No additional management support is needed unless otherwise documented below in the visit note. 

## 2014-05-21 NOTE — Assessment & Plan Note (Signed)
Resolved

## 2014-05-21 NOTE — Assessment & Plan Note (Signed)
Not well controlled off pravastatin.Marland Kitchen. Restart. Info on low chol diet given.

## 2014-05-21 NOTE — Telephone Encounter (Signed)
Pt is requesting to change pcp from Dr Patsy Lageropland to Dr Ermalene SearingBedsole due to seeing her most of the time.  Is this ok with you?

## 2014-05-21 NOTE — Assessment & Plan Note (Signed)
Poor control. Increase actos to max 45 mg daily. Encouraged exercise, weight loss, healthy eating habits. If not at goal in 3 months consider victoza.

## 2014-05-21 NOTE — Progress Notes (Signed)
   Subjective:    Patient ID: Tyler Hoffman, male    DOB: 08/08/1975, 39 y.o.   MRN: [id number]  HPI  39 year old male presents for BP and chest pain follow up. He was seen on 2/18 with elevated BPs. Most commonly at work due to stress. Lab eval reviewed and negative except glucose slightly high, chol high off pravastatin.  EKG NSR, no ST changes. UA clear  Stated on HCTZ daily. He states that he feels it is a little to strong, lightheaded.  BPs at work have been better. No further chest pain. Less anxiety.  BP Readings from Last 3 Encounters:  05/21/14 112/67  04/29/14 131/81  10/15/13 100/70   Strong family history of premature CAD in mother.  Diabetes: Slightly worsened despite actos. Allergies to metformin. Lab Results  Component Value Date   HGBA1C 7.3* 05/04/2014  Using medications without difficulties:None Hypoglycemic episodes:None Hyperglycemic episodes:None Feet problems:None Blood Sugars averaging: FBS: 100-140 eye exam within last year:due  Elevated Cholesterol: Off pravastatin but not taking regularly. Using medications without problems:NOne Muscle aches: none Diet compliance: poor  Exercise: none Other complaints:    Review of Systems  Constitutional: Negative for fever and fatigue.  Cardiovascular: Negative for chest pain.       Objective:   Physical Exam  Constitutional: Vital signs are normal. He appears well-developed and well-nourished.  obese  HENT:  Head: Normocephalic.  Right Ear: Hearing normal.  Left Ear: Hearing normal.  Nose: Nose normal.  Mouth/Throat: Oropharynx is clear and moist and mucous membranes are normal.  Neck: Trachea normal. Carotid bruit is not present. No thyroid mass and no thyromegaly present.  Cardiovascular: Normal rate, regular rhythm and normal pulses.  Exam reveals no gallop, no distant heart sounds and no friction rub.   No murmur heard. No peripheral edema  Pulmonary/Chest: Effort normal and breath sounds  normal. No respiratory distress.  Skin: Skin is warm, dry and intact. No rash noted.  Psychiatric: He has a normal mood and affect. His speech is normal and behavior is normal. Thought content normal.   Diabetic foot exam: Normal inspection No skin breakdown No calluses  Normal DP pulses Normal sensation to light touch and monofilament Nails normal      Assessment & Plan:

## 2014-05-24 NOTE — Telephone Encounter (Signed)
Very nice man. I am happy to see him at any point, but PCP change reasonable.

## 2014-08-04 ENCOUNTER — Encounter: Payer: Self-pay | Admitting: Primary Care

## 2014-08-04 ENCOUNTER — Ambulatory Visit (INDEPENDENT_AMBULATORY_CARE_PROVIDER_SITE_OTHER): Payer: BC Managed Care – PPO | Admitting: Primary Care

## 2014-08-04 VITALS — BP 142/88 | HR 116 | Temp 98.6°F | Ht 63.5 in | Wt 212.0 lb

## 2014-08-04 DIAGNOSIS — J069 Acute upper respiratory infection, unspecified: Secondary | ICD-10-CM | POA: Diagnosis not present

## 2014-08-04 MED ORDER — BENZONATATE 200 MG PO CAPS: 200.0000 mg | ORAL_CAPSULE | Freq: Three times a day (TID) | ORAL | Status: DC | PRN

## 2014-08-04 MED ORDER — FLUTICASONE PROPIONATE 50 MCG/ACT NA SUSP: 2.0000 | Freq: Every day | NASAL | Status: DC

## 2014-08-04 MED ORDER — HYDROCODONE-HOMATROPINE 5-1.5 MG/5ML PO SYRP: 5.0000 mL | ORAL_SOLUTION | Freq: Every evening | ORAL | Status: DC | PRN

## 2014-08-04 NOTE — Patient Instructions (Signed)
You may take the Benzonatate capsules three times daily as needed for daytime cough. You may take the Hycodan at bedtime for cough and sleep. Use the Flonase daily. 2 puffs in each nostril daily. Consider taking Zyrtec again earlier in the evening. You may want to take 1/2 tablet. It was nice meeting you!  Upper Respiratory Infection, Adult An upper respiratory infection (URI) is also sometimes known as the common cold. The upper respiratory tract includes the nose, sinuses, throat, trachea, and bronchi. Bronchi are the airways leading to the lungs. Most people improve within 1 week, but symptoms can last up to 2 weeks. A residual cough may last even longer.  CAUSES Many different viruses can infect the tissues lining the upper respiratory tract. The tissues become irritated and inflamed and often become very moist. Mucus production is also common. A cold is contagious. You can easily spread the virus to others by oral contact. This includes kissing, sharing a glass, coughing, or sneezing. Touching your mouth or nose and then touching a surface, which is then touched by another person, can also spread the virus. SYMPTOMS  Symptoms typically develop 1 to 3 days after you come in contact with a cold virus. Symptoms vary from person to person. They may include:  Runny nose.  Sneezing.  Nasal congestion.  Sinus irritation.  Sore throat.  Loss of voice (laryngitis).  Cough.  Fatigue.  Muscle aches.  Loss of appetite.  Headache.  Low-grade fever. DIAGNOSIS  You might diagnose your own cold based on familiar symptoms, since most people get a cold 2 to 3 times a year. Your caregiver can confirm this based on your exam. Most importantly, your caregiver can check that your symptoms are not due to another disease such as strep throat, sinusitis, pneumonia, asthma, or epiglottitis. Blood tests, throat tests, and X-rays are not necessary to diagnose a common cold, but they may sometimes be  helpful in excluding other more serious diseases. Your caregiver will decide if any further tests are required. RISKS AND COMPLICATIONS  You may be at risk for a more severe case of the common cold if you smoke cigarettes, have chronic heart disease (such as heart failure) or lung disease (such as asthma), or if you have a weakened immune system. The very young and very old are also at risk for more serious infections. Bacterial sinusitis, middle ear infections, and bacterial pneumonia can complicate the common cold. The common cold can worsen asthma and chronic obstructive pulmonary disease (COPD). Sometimes, these complications can require emergency medical care and may be life-threatening. PREVENTION  The best way to protect against getting a cold is to practice good hygiene. Avoid oral or hand contact with people with cold symptoms. Wash your hands often if contact occurs. There is no clear evidence that vitamin C, vitamin E, echinacea, or exercise reduces the chance of developing a cold. However, it is always recommended to get plenty of rest and practice good nutrition. TREATMENT  Treatment is directed at relieving symptoms. There is no cure. Antibiotics are not effective, because the infection is caused by a virus, not by bacteria. Treatment may include:  Increased fluid intake. Sports drinks offer valuable electrolytes, sugars, and fluids.  Breathing heated mist or steam (vaporizer or shower).  Eating chicken soup or other clear broths, and maintaining good nutrition.  Getting plenty of rest.  Using gargles or lozenges for comfort.  Controlling fevers with ibuprofen or acetaminophen as directed by your caregiver.  Increasing usage of your  inhaler if you have asthma. Zinc gel and zinc lozenges, taken in the first 24 hours of the common cold, can shorten the duration and lessen the severity of symptoms. Pain medicines may help with fever, muscle aches, and throat pain. A variety of  non-prescription medicines are available to treat congestion and runny nose. Your caregiver can make recommendations and may suggest nasal or lung inhalers for other symptoms.  HOME CARE INSTRUCTIONS   Only take over-the-counter or prescription medicines for pain, discomfort, or fever as directed by your caregiver.  Use a warm mist humidifier or inhale steam from a shower to increase air moisture. This may keep secretions moist and make it easier to breathe.  Drink enough water and fluids to keep your urine clear or pale yellow.  Rest as needed.  Return to work when your temperature has returned to normal or as your caregiver advises. You may need to stay home longer to avoid infecting others. You can also use a face mask and careful hand washing to prevent spread of the virus. SEEK MEDICAL CARE IF:   After the first few days, you feel you are getting worse rather than better.  You need your caregiver's advice about medicines to control symptoms.  You develop chills, worsening shortness of breath, or brown or red sputum. These may be signs of pneumonia.  You develop yellow or brown nasal discharge or pain in the face, especially when you bend forward. These may be signs of sinusitis.  You develop a fever, swollen neck glands, pain with swallowing, or white areas in the back of your throat. These may be signs of strep throat. SEEK IMMEDIATE MEDICAL CARE IF:   You have a fever.  You develop severe or persistent headache, ear pain, sinus pain, or chest pain.  You develop wheezing, a prolonged cough, cough up blood, or have a change in your usual mucus (if you have chronic lung disease).  You develop sore muscles or a stiff neck. Document Released: 08/22/2000 Document Revised: 05/21/2011 Document Reviewed: 06/03/2013 [facility] Patient Information 2015 [place], [place]. This information is not intended to replace advice given to you by your health care provider. Make sure you discuss any  questions you have with your health care provider.

## 2014-08-04 NOTE — Progress Notes (Signed)
Pre visit review using our clinic review tool, if applicable. No additional management support is needed unless otherwise documented below in the visit note. 

## 2014-08-04 NOTE — Progress Notes (Signed)
Subjective:    Patient ID: Tyler Hoffman, male    DOB: 05/17/1975, 39 y.o.   MRN: [id number]  HPI  Tyler Hoffman is a 39 year old male who presents today with a chief complaint of cough and sinus pressure. His children were recently diagnosed with croup. He started running a fever Friday afternoon then progressed to cough Saturday, and yesterday developed sinus pressure with worsening pressure throughout the day. He's been taking dayquil, mucinex, and some old  prescription cough medication with codeine. He has a history of allergies and has not taken Zyrtec for one month. He has a history of sinusitis and had surgery in the past with improvement.  Review of Systems  Constitutional: Positive for fever. Negative for chills.  HENT: Positive for congestion, postnasal drip and sinus pressure. Negative for ear pain and sore throat.   Respiratory: Positive for cough. Negative for shortness of breath.   Cardiovascular: Negative for chest pain.  Musculoskeletal: Negative for myalgias.       Past Medical History  Diagnosis Date  . Unspecified sleep apnea   . Obesity, unspecified   . Diabetes mellitus   . History of rosacea   . Dyslipidemia   . Diabetes mellitus   . OSA (obstructive sleep apnea)     History   Social History  . Marital Status: Married    Spouse Name: [place]  . Number of Children: 2  . Years of Education: N/A   Occupational History  . Principle     Emergency planning/management officer  . TEACHER    Social History Main Topics  . Smoking status: Never Smoker   . Smokeless tobacco: Never Used  . Alcohol Use: Yes     Comment: Very rare  . Drug Use: No  . Sexual Activity:    Partners: Female   Other Topics Concern  . Not on file   Social History Narrative   Wife: Recruitment consultant. Elementary.    [employer].   Performing arts.    2 children       Daily Caffeine Use 2-3 cups per day    Past Surgical History  Procedure Laterality Date  . Nasoplasty  08-2003    Family  History  Problem Relation Age of Onset  . Heart disease Mother 78  . Heart disease Paternal Grandfather   . Heart disease Mother   . Diabetes Maternal Grandfather   . Heart disease Paternal Grandmother   . Diabetes Maternal Grandfather     Allergies  Allergen Reactions  . Metformin     REACTION: diarrhea  . Metformin And Related   . Pseudoephedrine   . Septra [Sulfamethoxazole-Trimethoprim]   . Sulfa Antibiotics     diahrrea   . Sulfamethoxazole-Trimethoprim     REACTION: red all over and swelling  . Sulfonamide Derivatives     REACTION: swelling \T\ rash    Current Outpatient Prescriptions on File Prior to Visit  Medication Sig Dispense Refill  . aspirin 81 MG tablet Take 81 mg by mouth daily.      . Blood Glucose Monitoring Suppl (ONE TOUCH ULTRA MINI) W/DEVICE KIT Check blood sugar once daily and as directed. Dx 250.00 1 each 0  . glucose blood (ONE TOUCH ULTRA TEST) test strip Ck blood sugar once daily and as directed. Dx 250.00 100 each 0  . hydrochlorothiazide (HYDRODIURIL) 25 MG tablet Take 1 tablet (25 mg total) by mouth daily. 9030 tablet 3  . pioglitazone (ACTOS) 45 MG tablet TAKE  1 TABLET BY MOUTH DAILY 30 tablet 5  . pravastatin (PRAVACHOL) 80 MG tablet Take 1 tablet (80 mg total) by mouth daily. 90 tablet 3   No current facility-administered medications on file prior to visit.    BP 142/88 mmHg  Pulse 116  Temp(Src) 98.6 F (37 C) (Oral)  Ht 5' 3.5" (1.613 m)  Wt 212 lb (96.163 kg)  BMI 36.96 kg/m2  SpO2 94%    Objective:   Physical Exam  Constitutional: He appears well-nourished.  HENT:  Right Ear: Tympanic membrane and ear canal normal.  Left Ear: Tympanic membrane and ear canal normal.  Nose: Nose normal.  Mouth/Throat: Oropharynx is clear and moist.  Eyes: Conjunctivae are normal. Pupils are equal, round, and reactive to light.  Neck: Neck supple.  Cardiovascular: Normal rate and regular rhythm.   Pulmonary/Chest: Effort normal and breath  sounds normal.  Lymphadenopathy:    He has no cervical adenopathy.  Skin: Skin is warm and dry.          Assessment & Plan:  Upper respiratory tract infection:  Suspect viral cause due to symptoms and examination. Will treat with supportive measures. RX for Flonase, Tessalon Pearls (day cough), Hycodan (night cough). Start Zyrtec daily. Follow up if no improvement in 3-4 days.

## 2014-08-05 ENCOUNTER — Telehealth: Payer: Self-pay | Admitting: Family Medicine

## 2014-08-05 DIAGNOSIS — R059 Cough, unspecified: Secondary | ICD-10-CM

## 2014-08-05 DIAGNOSIS — R05 Cough: Secondary | ICD-10-CM

## 2014-08-05 MED ORDER — AZITHROMYCIN 250 MG PO TABS: ORAL_TABLET | ORAL | Status: DC

## 2014-08-05 NOTE — Telephone Encounter (Signed)
Pt left voicemail at Triage. Pt was seen by Mayra ReelKate Clark, NP yesterday and his sxs are worsening. Pt has a fever of 101 and now he is having yellow mucus nasal discharge and is requesting an abx sent to pharmacy on file. Pt request call back

## 2014-08-05 NOTE — Telephone Encounter (Signed)
Zpack sent to his pharmacy.  Thank you.

## 2014-08-06 NOTE — Telephone Encounter (Signed)
I left a message for the patient to return my call.

## 2014-08-10 NOTE — Telephone Encounter (Signed)
I left a message for the patient to return my call.

## 2014-08-11 NOTE — Telephone Encounter (Signed)
Called and notified patient of Kate's comments. Patient verbalized understanding.  

## 2014-08-11 NOTE — Telephone Encounter (Signed)
Pt returned your call, please call back at [phone]

## 2014-08-27 ENCOUNTER — Encounter: Payer: Self-pay | Admitting: Family Medicine

## 2014-08-27 ENCOUNTER — Ambulatory Visit (INDEPENDENT_AMBULATORY_CARE_PROVIDER_SITE_OTHER): Payer: BC Managed Care – PPO | Admitting: Family Medicine

## 2014-08-27 VITALS — BP 138/78 | HR 75 | Temp 98.2°F | Wt 212.5 lb

## 2014-08-27 DIAGNOSIS — E7800 Pure hypercholesterolemia, unspecified: Secondary | ICD-10-CM

## 2014-08-27 DIAGNOSIS — E1165 Type 2 diabetes mellitus with hyperglycemia: Secondary | ICD-10-CM

## 2014-08-27 DIAGNOSIS — I10 Essential (primary) hypertension: Secondary | ICD-10-CM | POA: Diagnosis not present

## 2014-08-27 DIAGNOSIS — IMO0001 Reserved for inherently not codable concepts without codable children: Secondary | ICD-10-CM

## 2014-08-27 DIAGNOSIS — E669 Obesity, unspecified: Secondary | ICD-10-CM

## 2014-08-27 LAB — HM DIABETES FOOT EXAM

## 2014-08-27 MED ORDER — FLUCONAZOLE 150 MG PO TABS: 150.0000 mg | ORAL_TABLET | Freq: Once | ORAL | Status: DC

## 2014-08-27 MED ORDER — CLOTRIMAZOLE 1 % EX CREA: 1.0000 "application " | TOPICAL_CREAM | Freq: Two times a day (BID) | CUTANEOUS | Status: DC

## 2014-08-27 MED ORDER — LIRAGLUTIDE 18 MG/3ML ~~LOC~~ SOPN: 0.6000 mg | PEN_INJECTOR | Freq: Every day | SUBCUTANEOUS | Status: DC

## 2014-08-27 NOTE — Assessment & Plan Note (Signed)
Encouraged exercise and weight loss., Discussed weight loss methods. Victoza will help with this issue.

## 2014-08-27 NOTE — Assessment & Plan Note (Signed)
Inadequate control. Add victoza to actos. Encouraged exercise, weight loss, healthy eating habits.  Follow up in 2-4 weeks.

## 2014-08-27 NOTE — Assessment & Plan Note (Signed)
Will recheck prior to next appt when he has been taking pravastain regularly.

## 2014-08-27 NOTE — Progress Notes (Signed)
Pre visit review using our clinic review tool, if applicable. No additional management support is needed unless otherwise documented below in the visit note. 

## 2014-08-27 NOTE — Assessment & Plan Note (Signed)
At goal on current medication.

## 2014-08-27 NOTE — Progress Notes (Signed)
   Subjective:    Patient ID: Tyler Hoffman, male    DOB: 04/24/1975, 39 y.o.   MRN: [id number]  HPI  39 year old male presents for 3 month follow up.   Hypertension:   BP was too high on lower dose of HCTZ. Now BP at goal on 25 mg daily BP Readings from Last 3 Encounters:  08/27/14 138/78  08/04/14 142/88  05/21/14 112/67    Using medication without problems or lightheadedness: None Chest pain with exertion:None Edema:None Short of breath:None Average home BPs: at goal Other issues:  Diabetes:   Due for re-eval. Now on increased dose, max actos at 45 mg daily  Lab Results  Component Value Date   HGBA1C 7.3* 05/04/2014  Using medications without difficulties: Hypoglycemic episodes: None Hyperglycemic episodes:300 after ice cream Feet problems: None Blood Sugars averaging: FBS 120-150 2 hours pp 250 . eye exam within last year: due  Currently having yeast infection at circumcision.. Occurs when he skips his DM meds. Has had in past.. Uses diflucan tab as well as clotrimazole cream..  has worked well in past.  he has his kids with him and defers exam.  Elevated Cholesterol:  Due for re-eval back on pravastatin. Has been off in last 3 weeks, needs to refill. Lab Results  Component Value Date   CHOL 251* 05/04/2014   HDL 39.80 05/04/2014   LDLCALC 179* 05/04/2014   LDLDIRECT 210.4 10/15/2013   TRIG 159.0* 05/04/2014   CHOLHDL 6 05/04/2014  Using medications without problems:None Muscle aches: None Diet compliance: Moderate Exercise: minimal Other complaints:     Review of Systems  Constitutional: Negative for fatigue.  HENT: Negative for ear pain.   Eyes: Negative for pain.  Respiratory: Negative for shortness of breath.   Cardiovascular: Negative for chest pain.       Objective:   Physical Exam  Constitutional: Vital signs are normal. He appears well-developed and well-nourished.  HENT:  Head: Normocephalic.  Right Ear: Hearing normal.  Left Ear: Hearing  normal.  Nose: Nose normal.  Mouth/Throat: Oropharynx is clear and moist and mucous membranes are normal.  Neck: Trachea normal. Carotid bruit is not present. No thyroid mass and no thyromegaly present.  Cardiovascular: Normal rate, regular rhythm and normal pulses.  Exam reveals no gallop, no distant heart sounds and no friction rub.   No murmur heard. No peripheral edema  Pulmonary/Chest: Effort normal and breath sounds normal. No respiratory distress.  Skin: Skin is warm, dry and intact. No rash noted.  Psychiatric: He has a normal mood and affect. His speech is normal and behavior is normal. Thought content normal.    Diabetic foot exam: Normal inspection No skin breakdown No calluses  Normal DP pulses Normal sensation to light touch and monofilament Nails normal  Body mass index is 37.05 kg/(m^2).      Assessment & Plan:

## 2014-08-27 NOTE — Patient Instructions (Addendum)
Start victoza.  Continue actos at current dose. Use diflucan and clotrimazole for yeast infection.  Set up eye exam as soon as possible. Follow up in 2-4 week, with labs a few days prior.

## 2015-01-07 ENCOUNTER — Other Ambulatory Visit: Payer: Self-pay | Admitting: Family Medicine

## 2015-02-28 ENCOUNTER — Telehealth: Payer: Self-pay | Admitting: Family Medicine

## 2015-03-01 NOTE — Telephone Encounter (Signed)
Spoke with pt  He stated he changed from dr copland to dr Ermalene Searingbedsole in July Made appointment with dr Ermalene Searingbedsole 12/23 labs 12/30 dm follow up Pt aware Please close

## 2015-03-01 NOTE — Telephone Encounter (Signed)
Please call and schedule Diabetic Follow up with fasting labs prior for Dr. Patsy Lageropland.

## 2015-03-01 NOTE — Telephone Encounter (Signed)
Pt called to verify that actos was sent to [facility];advised pt refill done electronically and pt will ck with pharmacy.

## 2015-03-03 ENCOUNTER — Telehealth: Payer: Self-pay | Admitting: Family Medicine

## 2015-03-03 DIAGNOSIS — E7800 Pure hypercholesterolemia, unspecified: Secondary | ICD-10-CM

## 2015-03-03 DIAGNOSIS — IMO0001 Reserved for inherently not codable concepts without codable children: Secondary | ICD-10-CM

## 2015-03-03 DIAGNOSIS — E1165 Type 2 diabetes mellitus with hyperglycemia: Principal | ICD-10-CM

## 2015-03-03 NOTE — Telephone Encounter (Signed)
-----   Message from Baldomero LamyNatasha C Chavers sent at 03/02/2015 11:10 AM EST ----- Regarding: dm f/u labs Fri 12/23, need orders. Thanks! :-) Please order  future dm f/u labs for pt's upcoming lab appt. Thanks Rodney Boozeasha

## 2015-03-04 ENCOUNTER — Other Ambulatory Visit (INDEPENDENT_AMBULATORY_CARE_PROVIDER_SITE_OTHER): Payer: BC Managed Care – PPO

## 2015-03-04 DIAGNOSIS — E7800 Pure hypercholesterolemia, unspecified: Secondary | ICD-10-CM | POA: Diagnosis not present

## 2015-03-04 DIAGNOSIS — E1165 Type 2 diabetes mellitus with hyperglycemia: Secondary | ICD-10-CM | POA: Diagnosis not present

## 2015-03-04 DIAGNOSIS — IMO0001 Reserved for inherently not codable concepts without codable children: Secondary | ICD-10-CM

## 2015-03-04 LAB — LIPID PANEL
CHOL/HDL RATIO: 5
Cholesterol: 203 mg/dL — ABNORMAL HIGH (ref 0–200)
HDL: 40.7 mg/dL (ref >39.00)
LDL Cholesterol: 139 mg/dL — ABNORMAL HIGH (ref 0–99)
NONHDL: 161.89
Triglycerides: 115 mg/dL (ref 0.0–149.0)
VLDL: 23 mg/dL (ref 0.0–40.0)

## 2015-03-04 LAB — COMPREHENSIVE METABOLIC PANEL
ALK PHOS: 73 U/L (ref 39–117)
ALT: 31 U/L (ref 0–53)
AST: 22 U/L (ref 0–37)
Albumin: 3.9 g/dL (ref 3.5–5.2)
BILIRUBIN TOTAL: 0.8 mg/dL (ref 0.2–1.2)
BUN: 19 mg/dL (ref 6–23)
CO2: 28 meq/L (ref 19–32)
CREATININE: 0.9 mg/dL (ref 0.40–1.50)
Calcium: 9.1 mg/dL (ref 8.4–10.5)
Chloride: 104 mEq/L (ref 96–112)
GFR: 99.1 mL/min (ref >60.00)
GLUCOSE: 150 mg/dL — AB (ref 70–99)
Potassium: 4.1 mEq/L (ref 3.5–5.1)
Sodium: 139 mEq/L (ref 135–145)
TOTAL PROTEIN: 6.6 g/dL (ref 6.0–8.3)

## 2015-03-04 LAB — MICROALBUMIN / CREATININE URINE RATIO
CREATININE, U: 222.9 mg/dL
MICROALB/CREAT RATIO: 0.3 mg/g (ref 0.0–30.0)
Microalb, Ur: <0.7 mg/dL (ref 0.0–1.9)

## 2015-03-04 LAB — HEMOGLOBIN A1C: HEMOGLOBIN A1C: 7 % — AB (ref 4.6–6.5)

## 2015-03-11 ENCOUNTER — Encounter: Payer: Self-pay | Admitting: Family Medicine

## 2015-03-11 ENCOUNTER — Ambulatory Visit (INDEPENDENT_AMBULATORY_CARE_PROVIDER_SITE_OTHER): Payer: BC Managed Care – PPO | Admitting: Family Medicine

## 2015-03-11 VITALS — BP 100/60 | HR 78 | Temp 98.4°F | Ht 63.5 in | Wt 212.2 lb

## 2015-03-11 DIAGNOSIS — I10 Essential (primary) hypertension: Secondary | ICD-10-CM

## 2015-03-11 DIAGNOSIS — E1165 Type 2 diabetes mellitus with hyperglycemia: Secondary | ICD-10-CM

## 2015-03-11 DIAGNOSIS — IMO0001 Reserved for inherently not codable concepts without codable children: Secondary | ICD-10-CM

## 2015-03-11 DIAGNOSIS — E7800 Pure hypercholesterolemia, unspecified: Secondary | ICD-10-CM | POA: Diagnosis not present

## 2015-03-11 LAB — HM DIABETES FOOT EXAM

## 2015-03-11 MED ORDER — LIRAGLUTIDE 18 MG/3ML ~~LOC~~ SOPN: 0.6000 mg | PEN_INJECTOR | Freq: Every day | SUBCUTANEOUS | Status: DC

## 2015-03-11 NOTE — Assessment & Plan Note (Signed)
Well controlled. Continue current medication.  

## 2015-03-11 NOTE — Assessment & Plan Note (Signed)
Improved control with lifestyle changes. Recommended trial of victoza in addition to actos for weight loss and glucose control.

## 2015-03-11 NOTE — Progress Notes (Signed)
Pre visit review using our clinic review tool, if applicable. No additional management support is needed unless otherwise documented below in the visit note. 

## 2015-03-11 NOTE — Patient Instructions (Addendum)
Make sure to set up eye exam yearly. Keep working on healthy eating, start regular exercise as able. Can consider trial of victoza.

## 2015-03-11 NOTE — Assessment & Plan Note (Signed)
Improved control with lifestyle and taking his meds regularly.  Encouraged exercise, weight loss, healthy eating habits.

## 2015-03-11 NOTE — Progress Notes (Signed)
   Subjective:    Patient ID: Tyler Hoffman, male    DOB: 06/05/1975, 39 y.o.   MRN: [id number]  HPI  39 year old male presents for follow up DM.   Diabetes:   He is now on max dose actos. He never tried the victoza but has tried to eat better. A1C is improved but not yet < 7.  Lab Results  Component Value Date   HGBA1C 7.0* 03/04/2015  Using medications without difficulties: None Hypoglycemic episodes: None Hyperglycemic episodes:None Feet problems:None Blood Sugars averaging:  100-120 eye exam within last year:  Wt Readings from Last 3 Encounters:  03/11/15 212 lb 4 oz (96.276 kg)  08/27/14 212 lb 8 oz (96.389 kg)  08/04/14 212 lb (96.163 kg)     Hypertension:    He is well controlled on HCTZ BP Readings from Last 3 Encounters:  03/11/15 100/60  08/27/14 138/78  08/04/14 142/88  Using medication without problems or lightheadedness:  Chest pain with exertion: Edema: Short of breath: Average home BPs: Other issues:  Elevated Cholesterol:  LDL improved but not at goal < 100 on pravastatin 80 mg. Lab Results  Component Value Date   CHOL 203* 03/04/2015   HDL 40.70 03/04/2015   LDLCALC 139* 03/04/2015   LDLDIRECT 210.4 10/15/2013   TRIG 115.0 03/04/2015   CHOLHDL 5 03/04/2015  Using medications without problems: None Muscle aches: None Diet compliance: improved Exercise: occ. Trying to improving and watching steps. Other complaints:    Review of Systems  Constitutional: Negative for fever and fatigue.  HENT: Negative for ear pain.   Eyes: Negative for pain.  Respiratory: Negative for cough and shortness of breath.   Cardiovascular: Negative for chest pain, palpitations and leg swelling.  Gastrointestinal: Negative for abdominal pain.  Genitourinary: Negative for dysuria.  Musculoskeletal: Negative for arthralgias.  Neurological: Negative for syncope, light-headedness and headaches.  Psychiatric/Behavioral: Negative for dysphoric mood.       Objective:   Physical Exam  Constitutional: Vital signs are normal. He appears well-developed and well-nourished.  HENT:  Head: Normocephalic.  Right Ear: Hearing normal.  Left Ear: Hearing normal.  Nose: Nose normal.  Mouth/Throat: Oropharynx is clear and moist and mucous membranes are normal.  Neck: Trachea normal. Carotid bruit is not present. No thyroid mass and no thyromegaly present.  Cardiovascular: Normal rate, regular rhythm and normal pulses.  Exam reveals no gallop, no distant heart sounds and no friction rub.   No murmur heard. No peripheral edema  Pulmonary/Chest: Effort normal and breath sounds normal. No respiratory distress.  Skin: Skin is warm, dry and intact. No rash noted.  Psychiatric: He has a normal mood and affect. His speech is normal and behavior is normal. Thought content normal.    Diabetic foot exam: Normal inspection No skin breakdown No calluses  Normal DP pulses Normal sensation to light touch and monofilament Nails normal       Assessment & Plan:

## 2015-07-21 ENCOUNTER — Other Ambulatory Visit: Payer: Self-pay | Admitting: Family Medicine

## 2015-07-21 ENCOUNTER — Telehealth: Payer: Self-pay

## 2015-07-21 NOTE — Telephone Encounter (Signed)
Pt last seen 03/11/15; pt was to return in 3 mths CPX with labs prior; pt thought he was supposed to stop taking Actos while taking victoza. Pt said has not taken actos in 5 weeks or more. Pt taking victoza 0.6 mg once daily.Per 03/11/15 note pt was to do trial of victoza in addition to Actos. Pt scheduled CPX on 08/11/15 and fasting labs 07/22/15. Pt request cb when lab results are reported instead of waiting for CPX on 08/11/15. FBS today was 140.[place].

## 2015-07-22 ENCOUNTER — Other Ambulatory Visit (INDEPENDENT_AMBULATORY_CARE_PROVIDER_SITE_OTHER): Payer: BC Managed Care – PPO

## 2015-07-22 ENCOUNTER — Telehealth: Payer: Self-pay | Admitting: Family Medicine

## 2015-07-22 DIAGNOSIS — E1165 Type 2 diabetes mellitus with hyperglycemia: Secondary | ICD-10-CM | POA: Diagnosis not present

## 2015-07-22 DIAGNOSIS — IMO0001 Reserved for inherently not codable concepts without codable children: Secondary | ICD-10-CM

## 2015-07-22 LAB — COMPREHENSIVE METABOLIC PANEL
ALBUMIN: 4.2 g/dL (ref 3.5–5.2)
ALT: 51 U/L (ref 0–53)
AST: 33 U/L (ref 0–37)
Alkaline Phosphatase: 99 U/L (ref 39–117)
BILIRUBIN TOTAL: 0.7 mg/dL (ref 0.2–1.2)
BUN: 20 mg/dL (ref 6–23)
CALCIUM: 8.7 mg/dL (ref 8.4–10.5)
CO2: 28 mEq/L (ref 19–32)
CREATININE: 0.88 mg/dL (ref 0.40–1.50)
Chloride: 101 mEq/L (ref 96–112)
GFR: 101.51 mL/min (ref >60.00)
Glucose, Bld: 173 mg/dL — ABNORMAL HIGH (ref 70–99)
Potassium: 4.5 mEq/L (ref 3.5–5.1)
Sodium: 137 mEq/L (ref 135–145)
Total Protein: 6.4 g/dL (ref 6.0–8.3)

## 2015-07-22 LAB — LDL CHOLESTEROL, DIRECT: Direct LDL: 114 mg/dL

## 2015-07-22 LAB — LIPID PANEL
CHOLESTEROL: 208 mg/dL — AB (ref 0–200)
HDL: 31.6 mg/dL — ABNORMAL LOW (ref >39.00)
NonHDL: 176.81
Total CHOL/HDL Ratio: 7
Triglycerides: 292 mg/dL — ABNORMAL HIGH (ref 0.0–149.0)
VLDL: 58.4 mg/dL — ABNORMAL HIGH (ref 0.0–40.0)

## 2015-07-22 LAB — HEMOGLOBIN A1C: HEMOGLOBIN A1C: 7.8 % — AB (ref 4.6–6.5)

## 2015-07-22 NOTE — Telephone Encounter (Signed)
If daily fbs are above 120 on victoza..  Go ahead and add back actos now.

## 2015-07-22 NOTE — Telephone Encounter (Signed)
-----   Message from Alvina Chouerri J Walsh sent at 07/22/2015  8:32 AM EDT ----- Regarding: lab orders asap Patient is scheduled for CPX labs, please order future labs, Thanks , Camelia Engerri

## 2015-07-22 NOTE — Telephone Encounter (Signed)
[name] notified as instructed by telephone.  He states he restarted the Actos this morning.

## 2015-08-11 ENCOUNTER — Ambulatory Visit (INDEPENDENT_AMBULATORY_CARE_PROVIDER_SITE_OTHER): Payer: BC Managed Care – PPO | Admitting: Family Medicine

## 2015-08-11 ENCOUNTER — Encounter: Payer: Self-pay | Admitting: Family Medicine

## 2015-08-11 DIAGNOSIS — Z0000 Encounter for general adult medical examination without abnormal findings: Secondary | ICD-10-CM | POA: Diagnosis not present

## 2015-08-11 DIAGNOSIS — I10 Essential (primary) hypertension: Secondary | ICD-10-CM

## 2015-08-11 DIAGNOSIS — E1165 Type 2 diabetes mellitus with hyperglycemia: Secondary | ICD-10-CM | POA: Diagnosis not present

## 2015-08-11 DIAGNOSIS — E7800 Pure hypercholesterolemia, unspecified: Secondary | ICD-10-CM | POA: Diagnosis not present

## 2015-08-11 DIAGNOSIS — N529 Male erectile dysfunction, unspecified: Secondary | ICD-10-CM | POA: Insufficient documentation

## 2015-08-11 DIAGNOSIS — IMO0001 Reserved for inherently not codable concepts without codable children: Secondary | ICD-10-CM

## 2015-08-11 DIAGNOSIS — E669 Obesity, unspecified: Secondary | ICD-10-CM

## 2015-08-11 DIAGNOSIS — R5383 Other fatigue: Secondary | ICD-10-CM | POA: Insufficient documentation

## 2015-08-11 LAB — HM DIABETES FOOT EXAM

## 2015-08-11 NOTE — Assessment & Plan Note (Signed)
Likely multifactorial due to poor diet, minimal exercise, obesity, poorly controlled sleep apnea.  return for repeat sleep eval. Eval with labs for thyroid, anemia, vitamin levels and testosterone.

## 2015-08-11 NOTE — Progress Notes (Signed)
The patient is here for annual wellness exam and preventative care.    He feels very tired all the time. Feels CPAP my not be working as well. 40 years old. He [lans on returning to his sleep medication. He has also noted decreased desire and erectile issues.  Diabetes: He is now on max dose  45 mg actos.  Now on victoza 1.2 mg daily.  Eating less. Lab Results  Component Value Date   HGBA1C 7.8* 07/22/2015  Using medications without difficulties: None Hypoglycemic episodes: None Hyperglycemic episodes:None Feet problems:None Blood Sugars averaging: 150 eye exam within last year: none  He has lost 13 pounds in last 6 months! Wt Readings from Last 3 Encounters:  08/11/15 199 lb 8 oz (90.493 kg)  03/11/15 212 lb 4 oz (96.276 kg)  08/27/14 212 lb 8 oz (96.389 kg)   Hypertension:  He is well controlled on HCTZ, did not actually take this morning. Tried time off of it.. No improvement of  Fatigue.  BP Readings from Last 3 Encounters:  08/11/15 120/60  03/11/15 100/60  08/27/14 138/78  Using medication without problems or lightheadedness:none  Chest pain with exertion:None Edema:None Short of breath:None Average home BPs:  120/60 Other issues:  Elevated Cholesterol: LDL improved but not at goal < 100 on pravastatin 80 mg. Lab Results  Component Value Date   CHOL 208* 07/22/2015   HDL 31.60* 07/22/2015   LDLCALC 139* 03/04/2015   LDLDIRECT 114.0 07/22/2015   TRIG 292.0* 07/22/2015   CHOLHDL 7 07/22/2015  Using medications without problems: None Muscle aches: None Diet compliance: moderate, good days and bad. Exercise: walking the dog twice daily , occ 3 miles Other complaints:   Social History /Family History/Past Medical History reviewed and updated if needed.  Review of Systems  Constitutional: Negative for fever and fatigue.  HENT: Negative for ear pain.  Eyes: Negative for pain.  Respiratory: Negative for cough and shortness of breath.   Cardiovascular: Negative for chest pain, palpitations and leg swelling.  Gastrointestinal: Negative for abdominal pain.  Genitourinary: Negative for dysuria.  Musculoskeletal: Negative for arthralgias.  Neurological: Negative for syncope, light-headedness and headaches.  Psychiatric/Behavioral: Negative for dysphoric mood.       Objective:   Physical Exam  Constitutional: Vital signs are normal. He appears well-developed and well-nourished.  HENT:  Head: Normocephalic.  Right Ear: Hearing normal.  Left Ear: Hearing normal.  Nose: Nose normal.  Mouth/Throat: Oropharynx is clear and moist and mucous membranes are normal.  Neck: Trachea normal. Carotid bruit is not present. No thyroid mass and no thyromegaly present.  Cardiovascular: Normal rate, regular rhythm and normal pulses. Exam reveals no gallop, no distant heart sounds and no friction rub.  No murmur heard. No peripheral edema  Pulmonary/Chest: Effort normal and breath sounds normal. No respiratory distress.  Skin: Skin is warm, dry and intact. No rash noted.  Psychiatric: He has a normal mood and affect. His speech is normal and behavior is normal. Thought content normal.    Diabetic foot exam: Normal inspection No skin breakdown No calluses  Normal DP pulses Normal sensation to light touch and monofilament Nails normal       ASSESSMENT AND PLAN: The patient's preventative maintenance and recommended screening tests for an annual wellness exam were reviewed in full today. Brought up to date unless services declined.  Counselled on the importance of diet, exercise, and its role in overall health and mortality. The patient's FH and SH was reviewed, including their home life,  tobacco status, and drug and alcohol status.   Vaccines: Due for pneumonia vaccine, will consider in future. No early prostate cancer in family  No early colon cancer in family BJY:[id number]:refused. Nonsmoker

## 2015-08-11 NOTE — Progress Notes (Signed)
Pre visit review using our clinic review tool, if applicable. No additional management support is needed unless otherwise documented below in the visit note. 

## 2015-08-11 NOTE — Addendum Note (Signed)
Addended by: Alvina ChouWALSH, Seirra Kos J on: 08/11/2015 03:04 PM   Modules accepted: Kipp BroodSmartSet

## 2015-08-11 NOTE — Assessment & Plan Note (Signed)
Improved LDL, almost at goal  < 100 on pravastatin.

## 2015-08-11 NOTE — Assessment & Plan Note (Signed)
Well controlled. Continue current medication.  

## 2015-08-11 NOTE — Assessment & Plan Note (Signed)
Weight loss with victoza, but no improvement in A1C. Get back on track with diet and exercise. Re-eval in 3 months.

## 2015-08-11 NOTE — Addendum Note (Signed)
Addended by: Alvina ChouWALSH, Augustino Savastano J on: 08/11/2015 04:25 PM   Modules accepted: Orders, SmartSet

## 2015-08-11 NOTE — Patient Instructions (Addendum)
Get back on track with healthy eating  (low carb and low fast , low cholesterol diet) and regular exercise. Call for follow up with sleep MD.  Stop at lab on way out.

## 2015-08-11 NOTE — Assessment & Plan Note (Signed)
Likely secondary to HTn and DM. Not on any meds that cause as common SE.

## 2015-08-12 LAB — CBC WITH DIFFERENTIAL/PLATELET
BASOS ABS: 0 10*3/uL (ref 0.0–0.1)
BASOS PCT: 0.4 % (ref 0.0–3.0)
Eosinophils Absolute: 0.2 10*3/uL (ref 0.0–0.7)
Eosinophils Relative: 1.8 % (ref 0.0–5.0)
HEMATOCRIT: 46.8 % (ref 39.0–52.0)
Hemoglobin: 16 g/dL (ref 13.0–17.0)
LYMPHS ABS: 2.5 10*3/uL (ref 0.7–4.0)
LYMPHS PCT: 24 % (ref 12.0–46.0)
MCHC: 34.2 g/dL (ref 30.0–36.0)
MCV: 95.3 fl (ref 78.0–100.0)
MONOS PCT: 4.3 % (ref 3.0–12.0)
Monocytes Absolute: 0.4 10*3/uL (ref 0.1–1.0)
NEUTROS ABS: 7.3 10*3/uL (ref 1.4–7.7)
NEUTROS PCT: 69.5 % (ref 43.0–77.0)
PLATELETS: 309 10*3/uL (ref 150.0–400.0)
RBC: 4.91 Mil/uL (ref 4.22–5.81)
RDW: 12.9 % (ref 11.5–15.5)
WBC: 10.4 10*3/uL (ref 4.0–10.5)

## 2015-08-12 LAB — T3, FREE: T3 FREE: 4.1 pg/mL (ref 2.3–4.2)

## 2015-08-12 LAB — TSH: TSH: 2.24 u[IU]/mL (ref 0.35–4.50)

## 2015-08-12 LAB — VITAMIN B12: Vitamin B-12: 510 pg/mL (ref 211–911)

## 2015-08-12 LAB — T4, FREE: Free T4: 0.92 ng/dL (ref 0.60–1.60)

## 2015-08-13 ENCOUNTER — Encounter: Payer: Self-pay | Admitting: Family Medicine

## 2015-08-14 ENCOUNTER — Telehealth: Payer: BC Managed Care – PPO | Admitting: Family

## 2015-08-14 DIAGNOSIS — H109 Unspecified conjunctivitis: Secondary | ICD-10-CM

## 2015-08-14 DIAGNOSIS — A499 Bacterial infection, unspecified: Secondary | ICD-10-CM

## 2015-08-14 DIAGNOSIS — B9689 Other specified bacterial agents as the cause of diseases classified elsewhere: Secondary | ICD-10-CM

## 2015-08-14 DIAGNOSIS — H1089 Other conjunctivitis: Secondary | ICD-10-CM

## 2015-08-14 DIAGNOSIS — J329 Chronic sinusitis, unspecified: Secondary | ICD-10-CM

## 2015-08-14 LAB — TESTOS,TOTAL,FREE AND SHBG (FEMALE)
SEX HORMONE BINDING GLOB.: 14 nmol/L (ref 10–50)
TESTOSTERONE,FREE: 44.6 pg/mL (ref 35.0–155.0)
Testosterone,Total,LC/MS/MS: 249 ng/dL — ABNORMAL LOW (ref 250–1100)

## 2015-08-14 MED ORDER — AMOXICILLIN-POT CLAVULANATE 875-125 MG PO TABS: 1.0000 | ORAL_TABLET | Freq: Two times a day (BID) | ORAL | Status: AC

## 2015-08-14 MED ORDER — OFLOXACIN 0.3 % OP SOLN: 1.0000 [drp] | Freq: Four times a day (QID) | OPHTHALMIC | Status: AC

## 2015-08-14 NOTE — Progress Notes (Signed)
We are sorry that you are not feeling well.  Here is how we plan to help!  Problem #1  Based on what you have shared with me it looks like you have sinusitis.  Sinusitis is inflammation and infection in the sinus cavities of the head.  Based on your presentation I believe you most likely have Acute Bacterial Sinusitis.  This is an infection caused by bacteria and is treated with antibiotics. I have prescribed Augmentin /125mg  one tablet twice daily with food, for 7 days. You may use an oral decongestant such as Mucinex D or if you have glaucoma or high blood pressure use plain Mucinex. Saline nasal spray help and can safely be used as often as needed for congestion.  If you develop worsening sinus pain, fever or notice severe headache and vision changes, or if symptoms are not better after completion of antibiotic, please schedule an appointment with a health care provider.    Sinus infections are not as easily transmitted as other respiratory infection, however we still recommend that you avoid close contact with loved ones, especially the very young and elderly.  Remember to wash your hands thoroughly throughout the day as this is the number one way to prevent the spread of infection!  Home Care:  Only take medications as instructed by your medical team.  Complete the entire course of an antibiotic.  Do not take these medications with alcohol.  A steam or ultrasonic humidifier can help congestion.  You can place a towel over your head and breathe in the steam from hot water coming from a faucet.  Avoid close contacts especially the very young and the elderly.  Cover your mouth when you cough or sneeze.  Always remember to wash your hands.  Get Help Right Away If:  You develop worsening fever or sinus pain.  You develop a severe head ache or visual changes.  Your symptoms persist after you have completed your treatment plan.  Make sure you  Understand these  instructions.  Will watch your condition.  Will get help right away if you are not doing well or get worse.  Problem #2:  We are sorry that you are not feeling well.  Here is how we plan to help!  Based on what you have shared with me it looks like you have conjunctivitis.  Conjunctivitis is a common inflammatory or infectious condition of the eye that is often referred to as "pink eye".  In most cases it is contagious (viral or bacterial). However, not all conjunctivitis requires antibiotics (ex. Allergic).  We have made appropriate suggestions for you based upon your presentation.  I have prescribed Oflaxacin 1-2 drops 4 times a day times 5 days   Pink eye can be highly contagious.  It is typically spread through direct contact with secretions, or contaminated objects or surfaces that one may have touched.  Strict handwashing is suggested with soap and water is urged.  If not available, use alcohol based had sanitizer.  Avoid unnecessary touching of the eye.  If you wear contact lenses, you will need to refrain from wearing them until you see no white discharge from the eye for at least 24 hours after being on medication.  You should see symptom improvement in 1-2 days after starting the medication regimen.  Call us if symptoms are not improved in 1-2 days.  Home Care:  Wash your hands often!  Do not wear your contacts until you complete your treatment plan.  Avoid sharing  towels, bed linen, personal items with a person who has pink eye.  See attention for anyone in your home with similar symptoms.  Get Help Right Away If:  Your symptoms do not improve.  You develop blurred or loss of vision.  Your symptoms worsen (increased discharge, pain or redness)    Your e-visit answers were reviewed by a board certified advanced clinical practitioner to complete your personal care plan.  Depending on the condition, your plan could have included both over the counter or prescription  medications.  If there is a problem please reply  once you have received a response from your provider.  Your safety is important to us.  If you have drug allergies check your prescription carefully.    You can use MyChart to ask questions about today's visit, request a non-urgent call back, or ask for a work or school excuse for 24 hours related to this e-Visit. If it has been greater than 24 hours you will need to follow up with your provider, or enter a new e-Visit to address those concerns.   You will get an e-mail in the next two days asking about your experience.  I hope that your e-visit has been valuable and will speed your recovery. Thank you for using e-visits.

## 2015-08-15 MED ORDER — AZITHROMYCIN 250 MG PO TABS: ORAL_TABLET | ORAL | Status: DC

## 2015-08-17 ENCOUNTER — Encounter: Payer: Self-pay | Admitting: Family Medicine

## 2015-08-26 ENCOUNTER — Encounter: Payer: Self-pay | Admitting: Family Medicine

## 2015-08-26 MED ORDER — LIRAGLUTIDE 18 MG/3ML ~~LOC~~ SOPN: 1.2000 mg | PEN_INJECTOR | Freq: Every day | SUBCUTANEOUS | Status: DC

## 2015-11-10 ENCOUNTER — Other Ambulatory Visit: Payer: Self-pay | Admitting: Family Medicine

## 2015-11-24 ENCOUNTER — Encounter: Payer: Self-pay | Admitting: Family Medicine

## 2015-12-20 ENCOUNTER — Ambulatory Visit (INDEPENDENT_AMBULATORY_CARE_PROVIDER_SITE_OTHER): Payer: BC Managed Care – PPO | Admitting: Pulmonary Disease

## 2015-12-20 ENCOUNTER — Encounter: Payer: Self-pay | Admitting: Pulmonary Disease

## 2015-12-20 DIAGNOSIS — G4733 Obstructive sleep apnea (adult) (pediatric): Secondary | ICD-10-CM

## 2015-12-20 NOTE — Patient Instructions (Signed)
Check download on current machine  If necessary we'll get you a loaner to establish exact pressure  Try to increase sleep quantity 7-7.5 hours every night  We discussed limiting cosleeping with kids

## 2015-12-20 NOTE — Assessment & Plan Note (Addendum)
Differential diagnosis of residual sleepiness while on CPAP includes- decreased sleep quantity -Sleep interruption due to kids behavioral issues -Inadequate treatment of OSA -Etiopathic cause   Check download on current machine  If necessary we'll get you a loaner to establish exact pressure  Try to increase sleep quantity 7-7.5 hours every night  We discussed limiting cosleeping with kids  Weight loss encouraged, compliance with goal of at least 4-6 hrs every night is the expectation. Advised against medications with sedative side effects Cautioned against driving when sleepy - understanding that sleepiness will vary on a day to day basis

## 2015-12-20 NOTE — Progress Notes (Signed)
Subjective:    Patient ID: Tyler Hoffman, male    DOB: 11/30/1975, 40 y.o.   MRN: [id number]  HPI  12/20/2015   Chief Complaint  Patient presents with  . 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 He was diagnosed by overnight PSG in 2009 which showed an AHI of 8/hour an RDI of 24/hour. He is not clear on what settings he is on but recently he increased his pressure to 14.5 cm. He uses a small Quattro fullface mask. He initially had good results with CPAP machine they lost some weight and got off of it for a while but then gained the weight back and in 2013 went back on CPAP again with good improvement in his daytime tiredness and fatigue. He has 3 kids aged 1,3 and 6 and lately he reports increased daytime somnolence and sleep pressure. He increased his CPAP pressure without much benefit and therefore would like to get checked out. Epworth sleepiness score is 5. Bedtime is between 10 and 11 PM, he puts the kids to bed around 8 PM and is often falling asleep right after that. His wife has Crohn's disease and takes Ambien, hence he is always the one waking up at night to attend to the children.The kids will often come to their bedroom and co-sleep.  Sleep latency is minimal, he reports 3-4 nocturnal awakenings but denies nocturia and is out of bed by 5:30 AM feeling tired with occasional dryness of mouth but denies headache. His weight has stayed constant since 2013  Weight loss encouraged, compliance with goal of at least 4-6 hrs every night is the expectation. Advised against medications with sedative side effects Cautioned against driving when sleepy - understanding that sleepiness will vary on a day to day basis        Past Medical History:  Diagnosis Date  . Diabetes mellitus   . Diabetes mellitus  (HCC)   . Dyslipidemia   . History of rosacea   . Obesity, unspecified   . OSA (obstructive sleep apnea)   . Unspecified sleep apnea       Past Surgical History:  Procedure Laterality Date  . nasoplasty  08-2003   Allergies  Allergen Reactions  . Metformin     REACTION: diarrhea  . Metformin And Related   . Pseudoephedrine   . Septra [Sulfamethoxazole-Trimethoprim]   . Sulfa Antibiotics     diahrrea   . Sulfamethoxazole-Trimethoprim     REACTION: red all over and swelling  . Sulfonamide Derivatives     REACTION: swelling \\T \ rash    Social History   Social History  . Marital status: Married    Spouse name: MacaoSamara  . Number of children: 2  . Years of education: N/A   Occupational History  . [employer]  . TEACHER [employer]   Social History Main Topics  . Smoking status: Never Smoker  . Smokeless tobacco: Never Used  . Alcohol use Yes     Comment: Very rare  . Drug use: No  . Sexual activity: Yes    Partners: Female   Other Topics Concern  . Not on file   Social History Narrative   Wife: Education officer, [employer] [place]   [employer].    [employer].   Performing arts.  2 children       Daily Caffeine Use 2-3 cups per day    Family History  Problem Relation Age of Onset  . Heart disease Mother 49  . Heart disease Paternal Grandfather   . Heart disease Mother   . Diabetes Maternal Grandfather   . Heart disease Paternal Grandmother   . Diabetes Maternal Grandfather      Review of Systems  Constitutional: Negative for activity change, appetite change, chills, fever and unexpected weight change.  HENT: Negative for congestion, dental problem, postnasal drip, rhinorrhea, sneezing, sore throat, trouble swallowing and voice change.   Eyes: Negative for visual disturbance.  Respiratory: Negative for cough, choking and shortness of breath.   Cardiovascular: Negative for chest pain and leg swelling.    Gastrointestinal: Negative for abdominal pain, nausea and vomiting.  Genitourinary: Negative for difficulty urinating.  Musculoskeletal: Negative for arthralgias.  Skin: Negative for rash.  Psychiatric/Behavioral: Negative for behavioral problems and confusion.       Objective:   Physical Exam  Gen. Pleasant, obese, in no distress ENT - no lesions, no post nasal drip Neck: No JVD, no thyromegaly, no carotid bruits Lungs: no use of accessory muscles, no dullness to percussion, decreased without rales or rhonchi  Cardiovascular: Rhythm regular, heart sounds  normal, no murmurs or gallops, no peripheral edema Musculoskeletal: No deformities, no cyanosis or clubbing , no tremors       Assessment & Plan:

## 2016-01-25 ENCOUNTER — Telehealth: Payer: Self-pay

## 2016-01-25 NOTE — Telephone Encounter (Signed)
Mr Tyler Hoffman left v/m; pt's daughter tested positive for flu and is presently taking tamiflu; due to Tyler Hoffman being immuno suppressed due to Chron's disease should Mr Tyler Hoffman start taking tamiflu. Mr Tyler Hoffman request cb.

## 2016-01-26 MED ORDER — OSELTAMIVIR PHOSPHATE 75 MG PO CAPS: 75.0000 mg | ORAL_CAPSULE | Freq: Every day | ORAL | 0 refills | Status: DC

## 2016-01-26 NOTE — Telephone Encounter (Signed)
Sent in tamiflu. Need to start within 48 hours of contact. 

## 2016-01-26 NOTE — Telephone Encounter (Signed)
Mr. Duggan notified as instructed by telephone. 

## 2016-01-26 NOTE — Addendum Note (Signed)
Addended by: Damita LackLORING, DONNA S on: 01/26/2016 09:03 AM   Modules accepted: Orders

## 2016-02-24 ENCOUNTER — Telehealth: Payer: Self-pay | Admitting: Pulmonary Disease

## 2016-02-24 DIAGNOSIS — G4733 Obstructive sleep apnea (adult) (pediatric): Secondary | ICD-10-CM

## 2016-02-24 NOTE — Telephone Encounter (Signed)
RA pt was last seen on 12/20/15 by you and he sent in an email and is requesting an rx for a new CPAP machine and supplies.  He will not be using [facility] for this, and would like the rx for this. Please advise. Thanks  Allergies  Allergen Reactions  . Metformin     REACTION: diarrhea  . Metformin And Related   . Pseudoephedrine   . Septra [Sulfamethoxazole-Trimethoprim]   . Sulfa Antibiotics     diahrrea   . Sulfamethoxazole-Trimethoprim     REACTION: red all over and swelling  . Sulfonamide Derivatives     REACTION: swelling \\T \ rash

## 2016-02-24 NOTE — Telephone Encounter (Signed)
I sent email back to the patient stating this was sent to triage and someone would be calling him.  The patient answered email with the following....  Comments:  The reason for the request is listed incorrectly. I would like to go ahead and get a new CPAP machine before year-end. Do I need a prescription to order one? I will not be using [facility] but will be ordering one from an online vendor.

## 2016-02-27 NOTE — Telephone Encounter (Signed)
We can certainly provide him with a prescription But I do not know his current CPAP settings- he verbally told me that CPAP was set at 14.5 cm But unable to obtain download We can just provided with a prescription for 14.5 cm or we can provide him with a loaner machine for 2 weeks and check download to establish correct  pressure-but this will take him into next year

## 2016-02-28 NOTE — Telephone Encounter (Signed)
Spoke with pt. He is aware of RA's response. Pt states that he will check his CPAP machine this afternoon for his current pressure and call us in the AM. Will await his return call.

## 2016-02-29 ENCOUNTER — Other Ambulatory Visit: Payer: Self-pay | Admitting: Family Medicine

## 2016-03-01 NOTE — Telephone Encounter (Signed)
Spoke with pt. He would like to pick up the prescription. This has been placed and put up front for pick up. Nothing further was needed.

## 2016-03-01 NOTE — Telephone Encounter (Signed)
lmtcb x1 for pt. 

## 2016-03-01 NOTE — Telephone Encounter (Signed)
Pt returning call the cpap pressure is @ 14.1 if you have further questionsStanley A Hoffman

## 2016-03-06 ENCOUNTER — Telehealth: Payer: Self-pay | Admitting: Pulmonary Disease

## 2016-03-06 DIAGNOSIS — G4733 Obstructive sleep apnea (adult) (pediatric): Secondary | ICD-10-CM

## 2016-03-06 NOTE — Telephone Encounter (Signed)
Called and spoke with pt and he stated that he received a script from RA for a cpap machine.  He stated that he has misplaced this rx and is wanting to have another one printed out so he can come by and pick this up.  RA please advise .thanks

## 2016-03-07 NOTE — Telephone Encounter (Signed)
Okay to give another prescription

## 2016-03-07 NOTE — Telephone Encounter (Signed)
rx has been done and has been placed up front for him to come by and pick this up.  Nothing further is needed.

## 2016-03-08 ENCOUNTER — Encounter: Payer: Self-pay | Admitting: Family Medicine

## 2016-03-09 ENCOUNTER — Other Ambulatory Visit: Payer: Self-pay | Admitting: Family Medicine

## 2016-03-09 ENCOUNTER — Telehealth: Payer: BC Managed Care – PPO | Admitting: Family

## 2016-03-09 DIAGNOSIS — N481 Balanitis: Secondary | ICD-10-CM

## 2016-03-09 MED ORDER — CLOTRIMAZOLE 1 % EX CREA: 1.0000 "application " | TOPICAL_CREAM | Freq: Two times a day (BID) | CUTANEOUS | 0 refills | Status: DC

## 2016-03-09 MED ORDER — FLUCONAZOLE 150 MG PO TABS: 150.0000 mg | ORAL_TABLET | Freq: Once | ORAL | 1 refills | Status: AC

## 2016-03-09 MED ORDER — NYSTATIN 100000 UNIT/GM EX CREA: 1.0000 "application " | TOPICAL_CREAM | Freq: Two times a day (BID) | CUTANEOUS | 0 refills | Status: DC

## 2016-03-09 NOTE — Progress Notes (Signed)
Rx's sent in. °

## 2016-03-09 NOTE — Progress Notes (Signed)
E Visit for Rash  We are sorry that you are not feeling well. Here is how we plan to help!  A fungal rash of the penis. I am sending in a prescription for Nystatin cream apply twice daily.   HOME CARE:   Take cool showers and avoid direct sunlight.  Apply cool compress or wet dressings.  Take a bath in an oatmeal bath.  Sprinkle content of one Aveeno packet under running faucet with comfortably warm water.  Bathe for 15-20 minutes, 1-2 times daily.  Pat dry with a towel. Do not rub the rash.  Use hydrocortisone cream.  Take an antihistamine like Benadryl for widespread rashes that itch.  The adult dose of Benadryl is 25-50 mg by mouth 4 times daily.  Caution:  This type of medication may cause sleepiness.  Do not drink alcohol, drive, or operate dangerous machinery while taking antihistamines.  Do not take these medications if you have prostate enlargement.  Read package instructions thoroughly on all medications that you take.  GET HELP RIGHT AWAY IF:   Symptoms don't go away after treatment.  Severe itching that persists.  If you rash spreads or swells.  If you rash begins to smell.  If it blisters and opens or develops a yellow-brown crust.  You develop a fever.  You have a sore throat.  You become short of breath.  MAKE SURE YOU:  Understand these instructions. Will watch your condition. Will get help right away if you are not doing well or get worse.  Thank you for choosing an e-visit. Your e-visit answers were reviewed by a board certified advanced clinical practitioner to complete your personal care plan. Depending upon the condition, your plan could have included both over the counter or prescription medications. Please review your pharmacy choice. Be sure that the pharmacy you have chosen is open so that you can pick up your prescription now.  If there is a problem you may message your provider in MyChart to have the prescription routed to another  pharmacy. Your safety is important to us. If you have drug allergies check your prescription carefully.  For the next 24 hours, you can use MyChart to ask questions about today's visit, request a non-urgent call back, or ask for a work or school excuse from your e-visit provider. You will get an email in the next two days asking about your experience. I hope that your e-visit has been valuable and will speed your recovery.

## 2016-03-13 ENCOUNTER — Telehealth: Payer: Self-pay | Admitting: Family Medicine

## 2016-03-13 MED ORDER — CLOTRIMAZOLE-BETAMETHASONE 1-0.05 % EX CREA: 1.0000 "application " | TOPICAL_CREAM | Freq: Two times a day (BID) | CUTANEOUS | 0 refills | Status: DC

## 2016-03-13 NOTE — Telephone Encounter (Signed)
[name] notified prescription has been sent to his pharmacy as requested.

## 2016-03-13 NOTE — Telephone Encounter (Signed)
RX sent in

## 2016-03-13 NOTE — Telephone Encounter (Signed)
Pt called stating the meds that was called in last week was incorrect he needs the other clotrimazole with benametrzole in it

## 2016-03-13 NOTE — Telephone Encounter (Signed)
Patient left message on triage line that medication prescribed last week by PCP is not working.  Needs to be changed. Did call the patient back left detailed message to call back to clarify request. Patient did request this be addressed by Dr. Ermalene SearingBedsole.

## 2016-04-02 ENCOUNTER — Telehealth: Payer: Self-pay | Admitting: *Deleted

## 2016-04-02 ENCOUNTER — Other Ambulatory Visit: Payer: Self-pay | Admitting: Family Medicine

## 2016-04-02 MED ORDER — OSELTAMIVIR PHOSPHATE 75 MG PO CAPS: 75.0000 mg | ORAL_CAPSULE | Freq: Every day | ORAL | 0 refills | Status: DC

## 2016-04-02 NOTE — Progress Notes (Signed)
Wife with flu

## 2016-04-02 NOTE — Telephone Encounter (Signed)
Moishe SpiceSamara called to say insurance will not cover the Tamiflu and ask that we call [facility] to try and get it approved.  [facility].  She states that Mr. Justice DeedsGann's insurance plan will only cover Tamiflu once every 90 days.  Patient received Tamiflu in Nov 17.  PA completed on CoverMyMeds.  Sent for review.  Mrs. Chauncey ReadingGann notified.

## 2016-04-03 ENCOUNTER — Other Ambulatory Visit: Payer: Self-pay | Admitting: Family Medicine

## 2016-04-03 NOTE — Telephone Encounter (Signed)
PA approved from 04/02/2016 thru 09/30/2016.  [name] notified by telephone.

## 2016-04-06 ENCOUNTER — Ambulatory Visit (INDEPENDENT_AMBULATORY_CARE_PROVIDER_SITE_OTHER): Payer: BC Managed Care – PPO | Admitting: Family Medicine

## 2016-04-06 ENCOUNTER — Encounter: Payer: Self-pay | Admitting: Family Medicine

## 2016-04-06 VITALS — BP 100/60 | HR 82 | Temp 98.5°F | Ht 63.5 in | Wt 197.0 lb

## 2016-04-06 DIAGNOSIS — E1165 Type 2 diabetes mellitus with hyperglycemia: Secondary | ICD-10-CM | POA: Diagnosis not present

## 2016-04-06 DIAGNOSIS — E7800 Pure hypercholesterolemia, unspecified: Secondary | ICD-10-CM

## 2016-04-06 DIAGNOSIS — IMO0001 Reserved for inherently not codable concepts without codable children: Secondary | ICD-10-CM

## 2016-04-06 DIAGNOSIS — R5383 Other fatigue: Secondary | ICD-10-CM

## 2016-04-06 DIAGNOSIS — I10 Essential (primary) hypertension: Secondary | ICD-10-CM | POA: Diagnosis not present

## 2016-04-06 LAB — MICROALBUMIN / CREATININE URINE RATIO
CREATININE, U: 61.9 mg/dL
Microalb Creat Ratio: 1.1 mg/g (ref 0.0–30.0)
Microalb, Ur: <0.7 mg/dL (ref 0.0–1.9)

## 2016-04-06 LAB — COMPREHENSIVE METABOLIC PANEL
ALT: 41 U/L (ref 0–53)
AST: 22 U/L (ref 0–37)
Albumin: 4.2 g/dL (ref 3.5–5.2)
Alkaline Phosphatase: 74 U/L (ref 39–117)
BUN: 15 mg/dL (ref 6–23)
CHLORIDE: 102 meq/L (ref 96–112)
CO2: 30 meq/L (ref 19–32)
Calcium: 9 mg/dL (ref 8.4–10.5)
Creatinine, Ser: 0.89 mg/dL (ref 0.40–1.50)
GFR: 99.85 mL/min (ref >60.00)
GLUCOSE: 275 mg/dL — AB (ref 70–99)
Potassium: 4 mEq/L (ref 3.5–5.1)
SODIUM: 138 meq/L (ref 135–145)
TOTAL PROTEIN: 6.7 g/dL (ref 6.0–8.3)
Total Bilirubin: 0.6 mg/dL (ref 0.2–1.2)

## 2016-04-06 LAB — VITAMIN D 25 HYDROXY (VIT D DEFICIENCY, FRACTURES): VITD: 8.83 ng/mL — AB (ref 30.00–100.00)

## 2016-04-06 LAB — CBC WITH DIFFERENTIAL/PLATELET
BASOS ABS: 0 10*3/uL (ref 0.0–0.1)
BASOS PCT: 0.3 % (ref 0.0–3.0)
EOS ABS: 0.2 10*3/uL (ref 0.0–0.7)
Eosinophils Relative: 2.2 % (ref 0.0–5.0)
HEMATOCRIT: 43.9 % (ref 39.0–52.0)
Hemoglobin: 15.3 g/dL (ref 13.0–17.0)
LYMPHS PCT: 33.3 % (ref 12.0–46.0)
Lymphs Abs: 2.3 10*3/uL (ref 0.7–4.0)
MCHC: 34.8 g/dL (ref 30.0–36.0)
MCV: 94.6 fl (ref 78.0–100.0)
MONOS PCT: 4.7 % (ref 3.0–12.0)
Monocytes Absolute: 0.3 10*3/uL (ref 0.1–1.0)
Neutro Abs: 4.2 10*3/uL (ref 1.4–7.7)
Neutrophils Relative %: 59.5 % (ref 43.0–77.0)
Platelets: 227 10*3/uL (ref 150.0–400.0)
RBC: 4.65 Mil/uL (ref 4.22–5.81)
RDW: 12.3 % (ref 11.5–15.5)
WBC: 7.1 10*3/uL (ref 4.0–10.5)

## 2016-04-06 LAB — T3, FREE: T3 FREE: 3.7 pg/mL (ref 2.3–4.2)

## 2016-04-06 LAB — T4, FREE: Free T4: 0.89 ng/dL (ref 0.60–1.60)

## 2016-04-06 LAB — VITAMIN B12: Vitamin B-12: 442 pg/mL (ref 211–911)

## 2016-04-06 LAB — HEMOGLOBIN A1C: Hgb A1c MFr Bld: 8.8 % — ABNORMAL HIGH (ref 4.6–6.5)

## 2016-04-06 LAB — TSH: TSH: 1.88 u[IU]/mL (ref 0.35–4.50)

## 2016-04-06 MED ORDER — LIRAGLUTIDE 18 MG/3ML ~~LOC~~ SOPN: PEN_INJECTOR | SUBCUTANEOUS | 0 refills | Status: DC

## 2016-04-06 NOTE — Progress Notes (Signed)
Pre visit review using our clinic review tool, if applicable. No additional management support is needed unless otherwise documented below in the visit note. 

## 2016-04-06 NOTE — Patient Instructions (Addendum)
Stop at lab on way out.  Increase victoza to 1.8 daily   Set up yearly eye exam. Increase exercsie 3-5 times a week.

## 2016-04-06 NOTE — Assessment & Plan Note (Signed)
Inadequate control. Eval with A1C. Increase victoza to 1.8 mg daily. Encouraged exercise, weight loss, healthy eating habits.

## 2016-04-06 NOTE — Assessment & Plan Note (Signed)
Good control on HCTZ. As BP decreasiing with lifestyle change.. Can use med as needed.

## 2016-04-06 NOTE — Assessment & Plan Note (Signed)
Worsened. Re-eval with labs. May be multifactorial due to obesity, OSA and stress.

## 2016-04-06 NOTE — Progress Notes (Signed)
   Subjective:    Patient ID: Tyler Hoffman, male    DOB: 09/10/1975, 41 y.o.   MRN: [id number]  HPI   41 year old male presents for  DM follow up.  Diabetes: Due for re-eval. On victoza 1.2. No SE, no nausea.  Using medications without difficulties: Hypoglycemic episodes: no Hyperglycemic episodes: yes Feet problems: no ulcers Blood Sugars averaging: FBS 130, 2 hours post prandial 250 eye exam within last year:  due Wt Readings from Last 3 Encounters:  04/06/16 197 lb (89.4 kg)  12/20/15 199 lb 3.2 oz (90.4 kg)  08/11/15 199 lb 8 oz (90.5 kg)   Elevated Cholesterol: due for re-eval. On pravastatin 80 mg daily.. High dose stain given high risk for CVD. Using medications without problems:one Muscle aches: none Diet compliance: improving Exercise: walkingduring the day. Other complaints:   Significantly worse fatigue, no bleeding.  Hypertension:   At goal on HCTZ BP Readings from Last 3 Encounters:  04/06/16 100/60  12/20/15 122/70  08/11/15 120/60  Using medication without problems or lightheadedness:  None Chest pain with exertion:none Edema:None Short of breath:none OSA: getting new CPAP Average home BPs: Other issues:   Review of Systems  Constitutional: Negative for fatigue.  HENT: Negative for ear pain.   Eyes: Negative for pain.  Respiratory: Negative for shortness of breath.   Cardiovascular: Negative for chest pain.       Objective:   Physical Exam  Constitutional: Vital signs are normal. He appears well-developed and well-nourished.  obese  HENT:  Head: Normocephalic.  Right Ear: Hearing normal.  Left Ear: Hearing normal.  Nose: Nose normal.  Mouth/Throat: Oropharynx is clear and moist and mucous membranes are normal.  Neck: Trachea normal. Carotid bruit is not present. No thyroid mass and no thyromegaly present.  Cardiovascular: Normal rate, regular rhythm and normal pulses.  Exam reveals no gallop, no distant heart sounds and no friction rub.     No murmur heard. No peripheral edema  Pulmonary/Chest: Effort normal and breath sounds normal. No respiratory distress.  Skin: Skin is warm, dry and intact. No rash noted.  Psychiatric: He has a normal mood and affect. His speech is normal and behavior is normal. Thought content normal.      Diabetic foot exam: Normal inspection No skin breakdown  Significant calluses  Normal DP pulses Normal sensation to light touch and monofilament Nails thickened     Assessment & Plan:

## 2016-04-07 ENCOUNTER — Encounter: Payer: Self-pay | Admitting: *Deleted

## 2016-04-07 MED ORDER — VITAMIN D (ERGOCALCIFEROL) 1.25 MG (50000 UNIT) PO CAPS: 50000.0000 [IU] | ORAL_CAPSULE | ORAL | 0 refills | Status: DC

## 2016-04-07 NOTE — Addendum Note (Signed)
Addended by: Damita LackLORING, DONNA S on: 04/07/2016 10:17 AM   Modules accepted: Orders

## 2016-04-26 ENCOUNTER — Encounter (HOSPITAL_COMMUNITY): Payer: Self-pay | Admitting: Emergency Medicine

## 2016-04-26 ENCOUNTER — Telehealth: Payer: Self-pay

## 2016-04-26 ENCOUNTER — Emergency Department (HOSPITAL_COMMUNITY)
Admission: EM | Admit: 2016-04-26 | Discharge: 2016-04-26 | Disposition: A | Discharge status: Home / Self Care | Payer: BC Managed Care – PPO | Attending: Emergency Medicine | Admitting: Emergency Medicine

## 2016-04-26 ENCOUNTER — Emergency Department (HOSPITAL_COMMUNITY): Payer: BC Managed Care – PPO

## 2016-04-26 DIAGNOSIS — Z79899 Other long term (current) drug therapy: Secondary | ICD-10-CM | POA: Diagnosis not present

## 2016-04-26 DIAGNOSIS — Z7982 Long term (current) use of aspirin: Secondary | ICD-10-CM | POA: Insufficient documentation

## 2016-04-26 DIAGNOSIS — Z5181 Encounter for therapeutic drug level monitoring: Secondary | ICD-10-CM | POA: Insufficient documentation

## 2016-04-26 DIAGNOSIS — I10 Essential (primary) hypertension: Secondary | ICD-10-CM | POA: Insufficient documentation

## 2016-04-26 DIAGNOSIS — E119 Type 2 diabetes mellitus without complications: Secondary | ICD-10-CM | POA: Diagnosis not present

## 2016-04-26 DIAGNOSIS — R519 Headache, unspecified: Secondary | ICD-10-CM

## 2016-04-26 DIAGNOSIS — R51 Headache: Secondary | ICD-10-CM | POA: Insufficient documentation

## 2016-04-26 LAB — COMPREHENSIVE METABOLIC PANEL
ALT: 63 U/L (ref 17–63)
AST: 33 U/L (ref 15–41)
Albumin: 4 g/dL (ref 3.5–5.0)
Alkaline Phosphatase: 80 U/L (ref 38–126)
Anion gap: 12 (ref 5–15)
BUN: 12 mg/dL (ref 6–20)
CO2: 27 mmol/L (ref 22–32)
CREATININE: 1.06 mg/dL (ref 0.61–1.24)
Calcium: 8.8 mg/dL — ABNORMAL LOW (ref 8.9–10.3)
Chloride: 97 mmol/L — ABNORMAL LOW (ref 101–111)
GFR calc Af Amer: >60 mL/min (ref >60)
Glucose, Bld: 208 mg/dL — ABNORMAL HIGH (ref 65–99)
Potassium: 3.8 mmol/L (ref 3.5–5.1)
Sodium: 136 mmol/L (ref 135–145)
TOTAL PROTEIN: 6.4 g/dL — AB (ref 6.5–8.1)
Total Bilirubin: 0.4 mg/dL (ref 0.3–1.2)

## 2016-04-26 LAB — CBG MONITORING, ED: Glucose-Capillary: 165 mg/dL — ABNORMAL HIGH (ref 65–99)

## 2016-04-26 LAB — DIFFERENTIAL
BASOS ABS: 0 10*3/uL (ref 0.0–0.1)
BASOS PCT: 0 %
EOS ABS: 0.2 10*3/uL (ref 0.0–0.7)
Eosinophils Relative: 2 %
LYMPHS ABS: 2.4 10*3/uL (ref 0.7–4.0)
Lymphocytes Relative: 27 %
MONO ABS: 0.4 10*3/uL (ref 0.1–1.0)
MONOS PCT: 5 %
NEUTROS ABS: 5.9 10*3/uL (ref 1.7–7.7)
Neutrophils Relative %: 66 %

## 2016-04-26 LAB — I-STAT CHEM 8, ED
BUN: 19 mg/dL (ref 6–20)
CALCIUM ION: 1.02 mmol/L — AB (ref 1.15–1.40)
CREATININE: 1.1 mg/dL (ref 0.61–1.24)
Chloride: 100 mmol/L — ABNORMAL LOW (ref 101–111)
GLUCOSE: 213 mg/dL — AB (ref 65–99)
HCT: 45 % (ref 39.0–52.0)
HEMOGLOBIN: 15.3 g/dL (ref 13.0–17.0)
Potassium: 4.3 mmol/L (ref 3.5–5.1)
Sodium: 137 mmol/L (ref 135–145)
TCO2: 29 mmol/L (ref 0–100)

## 2016-04-26 LAB — CBC
HEMATOCRIT: 44.1 % (ref 39.0–52.0)
HEMOGLOBIN: 15.5 g/dL (ref 13.0–17.0)
MCH: 32.5 pg (ref 26.0–34.0)
MCHC: 35.1 g/dL (ref 30.0–36.0)
MCV: 92.5 fL (ref 78.0–100.0)
Platelets: 251 10*3/uL (ref 150–400)
RBC: 4.77 MIL/uL (ref 4.22–5.81)
RDW: 11.9 % (ref 11.5–15.5)
WBC: 8.9 10*3/uL (ref 4.0–10.5)

## 2016-04-26 LAB — I-STAT TROPONIN, ED: TROPONIN I, POC: 0 ng/mL (ref 0.00–0.08)

## 2016-04-26 LAB — URINALYSIS, ROUTINE W REFLEX MICROSCOPIC
BACTERIA UA: NONE SEEN
BILIRUBIN URINE: NEGATIVE
Glucose, UA: >=500 mg/dL — AB
Hgb urine dipstick: NEGATIVE
Ketones, ur: 5 mg/dL — AB
Leukocytes, UA: NEGATIVE
Nitrite: NEGATIVE
Protein, ur: NEGATIVE mg/dL
SPECIFIC GRAVITY, URINE: 1.022 (ref 1.005–1.030)
SQUAMOUS EPITHELIAL / LPF: NONE SEEN
pH: 6 (ref 5.0–8.0)

## 2016-04-26 LAB — APTT: APTT: 29 s (ref 24–36)

## 2016-04-26 LAB — PROTIME-INR
INR: 1.04
Prothrombin Time: 13.7 seconds (ref 11.4–15.2)

## 2016-04-26 MED ORDER — SODIUM CHLORIDE 0.9 % IV BOLUS (SEPSIS)
1000.0000 mL | Freq: Once | INTRAVENOUS | Status: AC
  Administered 2016-04-26: 1000 mL via INTRAVENOUS

## 2016-04-26 MED ORDER — DEXAMETHASONE SODIUM PHOSPHATE 10 MG/ML IJ SOLN
10.0000 mg | Freq: Once | INTRAMUSCULAR | Status: AC
  Administered 2016-04-26: 10 mg via INTRAVENOUS
  Filled 2016-04-26: qty 1

## 2016-04-26 MED ORDER — ONDANSETRON HCL 4 MG PO TABS: 4.0000 mg | ORAL_TABLET | Freq: Three times a day (TID) | ORAL | 0 refills | Status: DC | PRN

## 2016-04-26 MED ORDER — DIPHENHYDRAMINE HCL 50 MG/ML IJ SOLN
12.5000 mg | Freq: Once | INTRAMUSCULAR | Status: AC
  Administered 2016-04-26: 12.5 mg via INTRAVENOUS
  Filled 2016-04-26: qty 1

## 2016-04-26 MED ORDER — PROCHLORPERAZINE EDISYLATE 5 MG/ML IJ SOLN
5.0000 mg | Freq: Once | INTRAMUSCULAR | Status: AC
  Administered 2016-04-26: 5 mg via INTRAVENOUS
  Filled 2016-04-26: qty 2

## 2016-04-26 NOTE — ED Triage Notes (Signed)
Pt had a migraine headache- been having high blood pressure. Pt took his own BP at home 265 systolic. Pt states he felt like was going to pass out due to pain in his head. EMS reports BP 160/100. Headache 7/10. Pt has been off of his BP meds due to it causing low BP. Pt was told by MD to use as needed. SR on monitor. CBG 226. Denies CP, no neuro symptoms per EMS.

## 2016-04-26 NOTE — ED Notes (Signed)
Requested disc copy of CT Head

## 2016-04-26 NOTE — ED Notes (Signed)
Pt ambulated to restroom with no complaints.  

## 2016-04-26 NOTE — Telephone Encounter (Signed)
Pt left v/m that his BP is 265/165. Pt request cb. Per chart review [facility] is at [facility] now. FYI to Dr Ermalene SearingBedsole.

## 2016-04-26 NOTE — Telephone Encounter (Signed)
Pt needs ED eval. Await their eval and recommendations.  Recent BPs here nml BP Readings from Last 3 Encounters:  04/26/16 130/83  04/06/16 100/60  12/20/15 122/70

## 2016-04-26 NOTE — ED Provider Notes (Signed)
[place] DEPT Provider Note   CSN: [id number] Arrival date & time: 04/26/16  1426     History   Chief Complaint Chief Complaint  Tyler Hoffman presents with  . Headache    HPI Tyler Hoffman is a 41 y.o. male with a past medical history significant for diabetes, hypertension, and sleep apnea who presents with headache, hypertension, and nausea. Tyler Hoffman is committed by his wife. 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 Tyler Hoffman does have a history of migraines but says this has worsened. Tyler Hoffman took his blood pressure at school and the cuff reported a blood pressure of 088 systolic. This was repeated on his other arm and was confirmed. It is unclear if the cuff was calibrated correctly. Tyler Hoffman says that his headache was a 7 out of 10 severity, across the front of his head, and associated with nausea, lightheadedness, and some blurry vision. He says the blurry vision improved but his headache is continued. He denies chest pain, shortness of breath, but reported some slight chest tightness. He denied vomiting, numbness, tingling, or weakness of extremities. He denies any neck pain or neck stiffness. He said that his lightheadedness improved when he sat down. He denies any other symptoms and denies any recent traumas. That he stopped taking his blood per medications for the last few weeks because his pressures were not that high during last PCP visit.    The history is provided by the Tyler Hoffman, the spouse and medical records. No language interpreter was used.    Past Medical History:  Diagnosis Date  . Diabetes mellitus   . Diabetes mellitus ([place])   . Dyslipidemia   . History of rosacea   . Obesity, unspecified   . OSA (obstructive sleep apnea)   . Unspecified sleep apnea     Tyler Hoffman Active Problem List   Diagnosis Date Noted  . Fatigue 08/11/2015  . Erectile dysfunction 08/11/2015  . HTN (hypertension), benign  04/29/2014  . Family history of premature CAD 04/29/2014  . Obesity (BMI 30-39.9) 10/16/2013  . OSA (obstructive sleep apnea) 11/05/2011  . ?ADD (attention deficit disorder) 07/25/2011  . Diabetes mellitus type 2, uncontrolled, without complications ([place]) 01/12/1593  . CONJUNCTIVITIS, ALLERGIC 06/04/2008  . SLEEP APNEA 04/14/2007  . HYPERCHOLESTEROLEMIA, PURE 08/22/2006    Past Surgical History:  Procedure Laterality Date  . nasoplasty  08-2003       Home Medications    Prior to Admission medications   Medication Sig Start Date End Date Taking? Authorizing Provider  aspirin 81 MG tablet Take 81 mg by mouth daily.      Historical Provider, MD  Blood Glucose Monitoring Suppl (ONE TOUCH ULTRA MINI) W/DEVICE KIT Check blood sugar once daily and as directed. Dx 250.00 05/06/13   Owens Loffler, MD  clotrimazole-betamethasone (LOTRISONE) cream Apply 1 application topically 2 (two) times daily. 03/13/16   Amy Cletis Athens, MD  fluticasone (FLONASE) 50 MCG/ACT nasal spray Place 2 sprays into both nostrils daily. 08/04/14   Pleas Koch, NP  glucose blood (ONE TOUCH ULTRA TEST) test strip Ck blood sugar once daily and as directed. Dx 250.00 05/06/13   Owens Loffler, MD  hydrochlorothiazide (HYDRODIURIL) 25 MG tablet TAKE 1 TABLET BY MOUTH DAILY 11/10/15   Amy E Diona Browner, MD  liraglutide (VICTOZA) 18 MG/3ML SOPN 1.8 mg inj daily 04/06/16   Amy E Diona Browner, MD  nystatin cream (MYCOSTATIN) Apply 1 application topically 2 (two) times daily. 03/09/16  Kennyth Arnold, FNP  oseltamivir (TAMIFLU) 75 MG capsule Take 1 capsule (75 mg total) by mouth daily. 04/02/16   Owens Loffler, MD  pravastatin (PRAVACHOL) 80 MG tablet TAKE 1 TABLET BY MOUTH DAILY 11/10/15   Amy Cletis Athens, MD  Vitamin D, Ergocalciferol, (DRISDOL) 50000 units CAPS capsule Take 1 capsule (50,000 Units total) by mouth every 7 (seven) days. 04/07/16   Amy Cletis Athens, MD    Family History Family History  Problem Relation Age of Onset  .  Heart disease Mother 73  . Heart disease Paternal Grandfather   . Diabetes Maternal Grandfather   . Heart disease Paternal Grandmother     Social History Social History  Substance Use Topics  . Smoking status: Never Smoker  . Smokeless tobacco: Never Used  . Alcohol use Yes     Comment: Very rare     Allergies   Metformin; Metformin and related; Pseudoephedrine; Septra [sulfamethoxazole-trimethoprim]; Sulfa antibiotics; Sulfamethoxazole-trimethoprim; and Sulfonamide derivatives   Review of Systems Review of Systems  Constitutional: Negative for activity change, chills, diaphoresis, fatigue and fever.  HENT: Negative for congestion and rhinorrhea.   Eyes: Positive for photophobia and visual disturbance.  Respiratory: Negative for cough, chest tightness, shortness of breath, wheezing and stridor.   Cardiovascular: Negative for chest pain, palpitations and leg swelling.  Gastrointestinal: Negative for abdominal distention, abdominal pain, blood in stool, constipation, diarrhea, nausea and vomiting.  Genitourinary: Negative for difficulty urinating, dysuria and flank pain.  Musculoskeletal: Negative for back pain and gait problem.  Skin: Negative for rash and wound.  Neurological: Positive for light-headedness and headaches. Negative for dizziness, syncope, speech difficulty, weakness and numbness.  Psychiatric/Behavioral: Negative for agitation and confusion.  All other systems reviewed and are negative.    Physical Exam Updated Vital Signs BP 120/78   Pulse 87   Temp 97.8 F (36.6 C) (Oral)   Resp 20   SpO2 96%   Physical Exam  Constitutional: He is oriented to person, place, and time. He appears well-developed and well-nourished.  HENT:  Head: Normocephalic and atraumatic.  Mouth/Throat: Oropharynx is clear and moist. No oropharyngeal exudate.  Eyes: Conjunctivae and EOM are normal. Pupils are equal, round, and reactive to light.  Neck: Normal range of motion. Neck  supple.  Cardiovascular: Normal rate and regular rhythm.   No murmur heard. Pulmonary/Chest: Effort normal and breath sounds normal. No respiratory distress. He has no wheezes. He has no rales. He exhibits no tenderness.  Abdominal: Soft. There is no tenderness.  Musculoskeletal: He exhibits no edema or tenderness.  Neurological: He is alert and oriented to person, place, and time. He has normal strength. He is not disoriented. He displays no tremor and normal reflexes. No cranial nerve deficit or sensory deficit. He exhibits normal muscle tone. Coordination and gait normal. GCS eye subscore is 4. GCS verbal subscore is 5. GCS motor subscore is 6.  Skin: Skin is warm and dry. Capillary refill takes less than 2 seconds. No erythema. No pallor.  Psychiatric: He has a normal mood and affect.  Nursing note and vitals reviewed.    ED Treatments / Results  Labs (all labs ordered are listed, but only abnormal results are displayed) Labs Reviewed  COMPREHENSIVE METABOLIC PANEL - Abnormal; Notable for the following:       Result Value   Chloride 97 (*)    Glucose, Bld 208 (*)    Calcium 8.8 (*)    Total Protein 6.4 (*)    All other components  within normal limits  URINALYSIS, ROUTINE W REFLEX MICROSCOPIC - Abnormal; Notable for the following:    Glucose, UA >=500 (*)    Ketones, ur 5 (*)    All other components within normal limits  CBG MONITORING, ED - Abnormal; Notable for the following:    Glucose-Capillary 165 (*)    All other components within normal limits  I-STAT CHEM 8, ED - Abnormal; Notable for the following:    Chloride 100 (*)    Glucose, Bld 213 (*)    Calcium, Ion 1.02 (*)    All other components within normal limits  PROTIME-INR  APTT  CBC  DIFFERENTIAL  I-STAT TROPOININ, ED    EKG  EKG Interpretation  Date/Time:  Thursday April 26 2016 14:35:31 EST Ventricular Rate:  80 PR Interval:    QRS Duration: 91 QT Interval:  386 QTC Calculation: 446 R  Axis:   64 Text Interpretation:  Sinus rhythm Low voltage, precordial leads No prior ECG for comparison.  No STEMI Confirmed by Sherry Ruffing MD, CHRISTOPHER [phone]) on 04/27/2016 1:20:28 AM       Radiology Ct Head Wo Contrast  Result Date: 04/26/2016 CLINICAL DATA:  Headache.  High blood pressure. EXAM: CT HEAD WITHOUT CONTRAST TECHNIQUE: Contiguous axial images were obtained from the base of the skull through the vertex without intravenous contrast. COMPARISON:  None. FINDINGS: Brain: There is no evidence of acute cortical infarct, intracranial hemorrhage, mass, midline shift, or extra-axial fluid collection. The ventricles are normal in size. There are chronic appearing lacunar infarcts in the basal ganglia bilaterally as well as in the posterior limb of the right internal capsule, left thalamus, and right parietal white matter. Vascular: No hyperdense vessel or unexpected calcification. Skull: No fracture or focal osseous lesion. Sinuses/Orbits: Postoperative changes and mild mucosal thickening in the paranasal sinuses. Clear mastoid air cells. Unremarkable orbits. Other: None. IMPRESSION: 1. No evidence of acute intracranial abnormality. 2. Multiple chronic lacunar infarcts as above. Electronically Signed   By: Logan Bores M.D.   On: 04/26/2016 15:57    Procedures Procedures (including critical care time)  Medications Ordered in ED Medications  sodium chloride 0.9 % bolus 1,000 mL (0 mLs Intravenous Stopped 04/26/16 1849)  prochlorperazine (COMPAZINE) injection 5 mg (5 mg Intravenous Given 04/26/16 1707)  diphenhydrAMINE (BENADRYL) injection 12.5 mg (12.5 mg Intravenous Given 04/26/16 1707)  dexamethasone (DECADRON) injection 10 mg (10 mg Intravenous Given 04/26/16 1707)     Initial Impression / Assessment and Plan / ED Course  I have reviewed the triage vital signs and the nursing notes.  Pertinent labs & imaging results that were available during my care of the Tyler Hoffman were reviewed by me and  considered in my medical decision making (see chart for details).     Oshua Mcconaha is a 41 y.o. male with a past medical history significant for diabetes, hypertension, and sleep apnea who presents with headache, hypertension, and nausea.    history and exam are seen above.  On exam, Tyler Hoffman has photophobia. Tyler Hoffman had no focal neurologic deficits on exam. No nuchal rigidity and normal neck movement. Lungs are clear and chest was nontender. Abdomen nontender. No edema of the legs.  Tyler Hoffman did not have severe hypertension as previously seen prior to arrival. Tyler Hoffman's blood pressure was in the 160s with EMS but had improved into the 120s upon arrival. Tyler Hoffman says his headache has slightly improved. He says he is having no more lightheadedness or vision changes but continues to report some nausea. Given the  report of severe hypertension in the setting of new headache with the vision changes nausea, Tyler Hoffman will have imaging and workup to look for hypertensive emergency or head bleed.  CT imaging did not show evidence of acute intracranial bleed but did show evidence of old stroke. After relation, Tyler Hoffman says that he had strokelike symptoms years ago which this appears to correspond.  After no hemorrhage was seen. A migraine cocktail was ordered. Tyler Hoffman had near complete resolution of headache after medications. Tyler Hoffman did not have any severe episodes of hypertension and orthostatics were normal. Tyler Hoffman felt much better.  Diagnostic workup testing showed negative troponin, no evidence of UTI, metabolic panel was grossly unremarkable aside from elevated glucose. CBC unremarkable and INR nonelevated.  Given the resolution in symptoms, continued demonstrated blood pressure stability, and no evidence of acute abnormality, feel Tyler Hoffman is stable for discharge. Tyler Hoffman instructed to follow-up with his PCP in several days for further discussion on blood pressure and headache management. Tyler Hoffman given  prescription for Zofran for his nausea. Tyler Hoffman understood return precautions for any new or worsening symptoms and Tyler Hoffman was discharged with family in good condition.   Final Clinical Impressions(s) / ED Diagnoses   Final diagnoses:  Nonintractable headache, unspecified chronicity pattern, unspecified headache type    New Prescriptions Discharge Medication List as of 04/26/2016  8:32 PM    START taking these medications   Details  ondansetron (ZOFRAN) 4 MG tablet Take 1 tablet (4 mg total) by mouth every 8 (eight) hours as needed for nausea or vomiting., Starting Thu 04/26/2016, Print        Clinical Impression: 1. Nonintractable headache, unspecified chronicity pattern, unspecified headache type     Disposition: Discharge  Condition: Good  I have discussed the results, Dx and Tx plan with the pt(& family if present). He/she/they expressed understanding and agree(s) with the plan. Discharge instructions discussed at great length. Strict return precautions discussed and pt &/or family have verbalized understanding of the instructions. No further questions at time of discharge.    Discharge Medication List as of 04/26/2016  8:32 PM    START taking these medications   Details  ondansetron (ZOFRAN) 4 MG tablet Take 1 tablet (4 mg total) by mouth every 8 (eight) hours as needed for nausea or vomiting., Starting Thu 04/26/2016, Print        Follow Up: Jinny Sanders, MD [place] [address] [place] [address] [id number] [place] [phone]  If symptoms worsen     Courtney Paris, MD 04/27/16 [phone]

## 2016-04-26 NOTE — ED Notes (Signed)
Provided pt with turkey sandwich 

## 2016-04-27 ENCOUNTER — Encounter: Payer: Self-pay | Admitting: Family Medicine

## 2016-04-27 ENCOUNTER — Ambulatory Visit (INDEPENDENT_AMBULATORY_CARE_PROVIDER_SITE_OTHER): Payer: BC Managed Care – PPO | Admitting: Family Medicine

## 2016-04-27 DIAGNOSIS — I10 Essential (primary) hypertension: Secondary | ICD-10-CM | POA: Diagnosis not present

## 2016-04-27 DIAGNOSIS — I639 Cerebral infarction, unspecified: Secondary | ICD-10-CM | POA: Diagnosis not present

## 2016-04-27 DIAGNOSIS — IMO0001 Reserved for inherently not codable concepts without codable children: Secondary | ICD-10-CM

## 2016-04-27 DIAGNOSIS — E1165 Type 2 diabetes mellitus with hyperglycemia: Secondary | ICD-10-CM | POA: Diagnosis not present

## 2016-04-27 DIAGNOSIS — I6381 Other cerebral infarction due to occlusion or stenosis of small artery: Secondary | ICD-10-CM | POA: Insufficient documentation

## 2016-04-27 MED ORDER — GLUCOSE BLOOD VI STRP: ORAL_STRIP | 0 refills | Status: DC

## 2016-04-27 NOTE — Progress Notes (Signed)
Pre visit review using our clinic review tool, if applicable. No additional management support is needed unless otherwise documented below in the visit note. 

## 2016-04-27 NOTE — Progress Notes (Signed)
   Subjective:    Patient ID: Tyler Hoffman, male    DOB: 08/31/1975, 41 y.o.   MRN: [id number]  HPI  41 year old male presents for follow up ER visit for elevated blood pressure.   He was seen on  2/15 at [facility]. Summary of HPI copied from this visit note: Patient took his blood pressure at school and the cuff reported a blood pressure of 265 systolic. This was repeated on his other arm and was confirmed. It is unclear if the cuff was calibrated correctly. Patient says that his headache was a 7 out of 10 severity, across the front of his head, and associated with nausea, lightheadedness, and some blurry vision. He says the blurry vision improved but his headache is continued. He denies chest pain, shortness of breath, but reported some slight chest tightness. He denied vomiting, numbness, tingling, or weakness of extremities. He denies any neck pain or neck stiffness. He said that his lightheadedness improved when he sat down. He denies any other symptoms and denies any recent traumas. That he stopped taking his blood per medications for the last few weeks because his pressures were not that high during last PCP visit.  BP at ER was 120/78, labs show glucose 208  Calcium 8.8  UA clear except for glucose.  neg trop, cbvc  EKG unremarkable  Head CT: no bleed,  Chronic appear lacunar infarcts.  Treated with migraine cocktail and headache resolved.  Today 04/27/16 He notes no further  Headache. BP has been running low   He has restarted HCTZ .Marland Kitchen. It makes him feel tired. BP Readings from Last 3 Encounters:  04/27/16 108/67  04/26/16 126/80  04/06/16 100/60   CBGs on victoza 1.8 mcg daily. He had a cookie the day of ER visit.  FBS 130s  Review of Systems  Constitutional: Positive for fatigue. Negative for fever.  HENT: Negative for ear pain.   Eyes: Negative for pain.  Respiratory: Negative for chest tightness and shortness of breath.   Cardiovascular: Negative for chest pain, palpitations  and leg swelling.  Gastrointestinal: Negative for abdominal distention.       Objective:   Physical Exam  Constitutional: He is oriented to person, place, and time. Vital signs are normal. He appears well-developed and well-nourished.  HENT:  Head: Normocephalic.  Right Ear: Hearing normal.  Left Ear: Hearing normal.  Nose: Nose normal.  Mouth/Throat: Oropharynx is clear and moist and mucous membranes are normal.  Neck: Trachea normal. Carotid bruit is not present. No thyroid mass and no thyromegaly present.  Cardiovascular: Normal rate, regular rhythm and normal pulses.  Exam reveals no gallop, no distant heart sounds and no friction rub.   No murmur heard. No peripheral edema  Pulmonary/Chest: Effort normal and breath sounds normal. No respiratory distress.  Neurological: He is oriented to person, place, and time. He has normal strength. He is unresponsive. He is not disoriented. No cranial nerve deficit or sensory deficit. He displays a negative Romberg sign. Coordination and gait normal. GCS eye subscore is 4. GCS verbal subscore is 5. GCS motor subscore is 6.  Skin: Skin is warm, dry and intact. No rash noted.  Psychiatric: He has a normal mood and affect. His speech is normal and behavior is normal. Thought content normal.          Assessment & Plan:

## 2016-04-27 NOTE — Assessment & Plan Note (Signed)
Increase ASA to 325 mg daily.  Will refer to neuro for further eval in light of mother PFO.

## 2016-04-27 NOTE — Assessment & Plan Note (Signed)
Follow back on BP med HCTZ. Discussed possibly considering BBlocker  prngiven stress related BP. Also discussed alprazolam for anxiety that seems to trigger BP elevation since they usually happen at work (this happened 1 hour after being confronted by an angry parent). Pt will consider. Encouraged exercise, weight loss, healthy eating habits.

## 2016-04-27 NOTE — Assessment & Plan Note (Signed)
Improved control on higher dose victoza, but pt realizes he cannot have sweets like cookie he had before ED.

## 2016-04-27 NOTE — Patient Instructions (Addendum)
Work on low [place] diet.  Stay on current dose victoxza.  Please stop at the front desk or lab to set up referral or to have labs drawn.

## 2016-05-10 DIAGNOSIS — I6381 Other cerebral infarction due to occlusion or stenosis of small artery: Secondary | ICD-10-CM

## 2016-05-10 HISTORY — DX: Other cerebral infarction due to occlusion or stenosis of small artery: I63.81

## 2016-05-22 ENCOUNTER — Ambulatory Visit (INDEPENDENT_AMBULATORY_CARE_PROVIDER_SITE_OTHER): Payer: BC Managed Care – PPO | Admitting: Neurology

## 2016-05-22 ENCOUNTER — Encounter: Payer: Self-pay | Admitting: Neurology

## 2016-05-22 VITALS — BP 144/88 | HR 108 | Resp 16 | Ht 63.5 in | Wt 188.0 lb

## 2016-05-22 DIAGNOSIS — I10 Essential (primary) hypertension: Secondary | ICD-10-CM | POA: Diagnosis not present

## 2016-05-22 DIAGNOSIS — G4489 Other headache syndrome: Secondary | ICD-10-CM

## 2016-05-22 DIAGNOSIS — I639 Cerebral infarction, unspecified: Secondary | ICD-10-CM | POA: Diagnosis not present

## 2016-05-22 DIAGNOSIS — G4733 Obstructive sleep apnea (adult) (pediatric): Secondary | ICD-10-CM | POA: Diagnosis not present

## 2016-05-22 DIAGNOSIS — I6381 Other cerebral infarction due to occlusion or stenosis of small artery: Secondary | ICD-10-CM

## 2016-05-22 HISTORY — DX: Essential (primary) hypertension: I10

## 2016-05-22 NOTE — Progress Notes (Signed)
[facility]  PATIENT: Tyler Hoffman DOB: 08-14-1975  REFERRING DOCTOR OR PCP:  Daleen Squibb SOURCE: patient, ED records, CT and lab reports, CT images on PACS  _________________________________   HISTORICAL  CHIEF COMPLAINT:  Chief Complaint  Patient presents with  . Headache    Tyler Hoffman is here with his wife Tyler Hoffman, to discuss MRI results.   Sts. on 2/03/16/16, he was at work, had severe frontal h/a.  Was seen and treated in the ER.  CT head shows mult. chronic lacuanar infarcts. Mother with a hx. of PFO, CVA.  Pt. with  hx. of OSA, sts. is compliant with CPAP, which is managed by [facility].Tyler Hoffman  . Abnormal CT head    HISTORY OF PRESENT ILLNESS:  I had the pleasure of seeing you patient, Tyler Hoffman, at [facility] for neurologic consultation regarding his headaches and abnormal CT scan.    He is a 41 year old man who had a severe headache on 04/26/2016.   Tyler Hoffman was at work when he experienced a severe frontal headache with throbbing.  He felt nauseous and lightheaded and his face was noted to be red.   He felt vision was off (faces looked distorted) and he felt he was not thinking clearly.   He looked back at his writing and it made no sense.   His wife felt he was not making sense on the phone.  He does not think he had photophobia. BP was 250/175 using a wrist cuff at work    EMT found a BP of 175/100 and BP was 120/78 at the ED.   In the emergency room, he was given Phenergan and steroid. He felt better when he left the emergency room though he was sleepy from the Phenergan. The next day he felt completely at baseline. While in the emergency room a CT scan showed several lacunar infarctions in the basal ganglia bilaterally and right internal capsule.  He was only on HCTZ but had stopped due to being lightheaded and having some BP readings below 756 systolic.  Of note, he has lost 40 pounds in the last couple of years..  He took his BP  reading often at home and it was fine but does note having to deal with a student's grandmother who was angry an hour or two before the event last month.        He has several stroke risks including HTN, NIDDM.  He does not smoke or drink ETOH.      Five years ago, he had a similar severe headache that lasted for 2 days there were no neurologic symptoms associated with it. Ten years ago, he had the onset of left-sided numbness predominantly in the arm. The numbness got better after about 15 minutes but he continued to feel that the hand was uncoordinated for a couple of days. He recalls that he was having difficulty playing a musical instrument because of the clumsiness in the left hand. Of note, blood pressure was normal at that time.  Because he got better after couple days, he did not seek medical attention.  I personally reviewed the CT scan performed 04/26/2016. There are hypodense foci adjacent to the basal ganglia bilaterally and also one in the right posterior internal capsule.   The largest is inferior to the basal ganglia on the right. I can't rule out that some of these represent Virchow-Robin spaces though the internal capsule focus in the left basal ganglia focus would be much more likely  to represent a lacunar infarctions based on location.    REVIEW OF SYSTEMS: Constitutional: No fevers, chills, sweats, or change in appetite Eyes: No visual changes, double vision, eye pain Ear, nose and throat: No hearing loss, ear pain, nasal congestion, sore throat Cardiovascular: No chest pain, palpitations Respiratory: No shortness of breath at rest or with exertion.   No wheezes GastrointestinaI: No nausea, vomiting, diarrhea, abdominal pain, fecal incontinence Genitourinary: No dysuria, urinary retention or frequency.  No nocturia. Musculoskeletal: No neck pain, back pain Integumentary: No rash, pruritus, skin lesions Neurological: as above Psychiatric: No depression at this time.  No  anxiety Endocrine: No palpitations, diaphoresis, change in appetite, change in weigh or increased thirst Hematologic/Lymphatic: No anemia, purpura, petechiae. Allergic/Immunologic: No itchy/runny eyes, nasal congestion, recent allergic reactions, rashes  ALLERGIES: Allergies  Allergen Reactions  . Metformin Diarrhea  . Metformin And Related Diarrhea  . Pseudoephedrine Hypertension  . Sulfa Antibiotics Diarrhea  . Septra [Sulfamethoxazole-Trimethoprim] Diarrhea, Swelling and Rash  . Sulfamethoxazole-Trimethoprim Swelling and Rash    Red all over, also  . Sulfonamide Derivatives Swelling and Rash    HOME MEDICATIONS:  Current Outpatient Prescriptions:  .  aspirin 81 MG tablet, Take 81 mg by mouth daily.  , Disp: , Rfl:  .  Blood Glucose Monitoring Suppl (ONE TOUCH ULTRA MINI) W/DEVICE KIT, Check blood sugar once daily and as directed. Dx 250.00, Disp: 1 each, Rfl: 0 .  fluticasone (FLONASE) 50 MCG/ACT nasal spray, Place 2 sprays into both nostrils daily. (Patient taking differently: Place 2 sprays into both nostrils daily as needed for allergies or rhinitis. ), Disp: 16 g, Rfl: 1 .  glucose blood (ONE TOUCH ULTRA TEST) test strip, Ck blood sugar once daily and as directed. Dx 250.00, Disp: 100 each, Rfl: 0 .  hydrochlorothiazide (HYDRODIURIL) 25 MG tablet, TAKE 1 TABLET BY MOUTH DAILY, Disp: 90 tablet, Rfl: 1 .  ibuprofen (ADVIL,MOTRIN) 200 MG tablet, Take 200-600 mg by mouth every 6 (six) hours as needed (for headaches)., Disp: , Rfl:  .  liraglutide (VICTOZA) 18 MG/3ML SOPN, 1.8 mg inj daily, Disp: 9 mL, Rfl: 0 .  nystatin cream (MYCOSTATIN), Apply 1 application topically 2 (two) times daily. (Patient taking differently: Apply 1 application topically 2 (two) times daily as needed for dry skin. ), Disp: 30 g, Rfl: 0 .  ondansetron (ZOFRAN) 4 MG tablet, Take 1 tablet (4 mg total) by mouth every 8 (eight) hours as needed for nausea or vomiting., Disp: 12 tablet, Rfl: 0 .  pravastatin  (PRAVACHOL) 80 MG tablet, TAKE 1 TABLET BY MOUTH DAILY, Disp: 90 tablet, Rfl: 2 .  Vitamin D, Ergocalciferol, (DRISDOL) 50000 units CAPS capsule, Take 1 capsule (50,000 Units total) by mouth every 7 (seven) days., Disp: 12 capsule, Rfl: 0  PAST MEDICAL HISTORY: Past Medical History:  Diagnosis Date  . Diabetes mellitus   . Diabetes mellitus ([place])   . Dyslipidemia   . History of rosacea   . Obesity, unspecified   . OSA (obstructive sleep apnea)   . Unspecified sleep apnea     PAST SURGICAL HISTORY: Past Surgical History:  Procedure Laterality Date  . nasoplasty  08-2003    FAMILY HISTORY: Family History  Problem Relation Age of Onset  . Heart disease Mother 53  . Heart disease Paternal Grandfather   . Diabetes Maternal Grandfather   . Heart disease Paternal Grandmother     SOCIAL HISTORY:  Social History   Social History  . Marital status: Married  Spouse name: [place]  . Number of children: 2  . Years of education: N/A   Occupational History  . 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 [place]   Social History Main Topics  . Smoking status: Never Smoker  . Smokeless tobacco: Never Used  . Alcohol use Yes     Comment: Very rare  . Drug use: No  . Sexual activity: Yes    Partners: Female   Other Topics Concern  . Not on file   Social History Narrative   Wife: Recruitment consultant. Elementary.    [employer] style.   Performing arts.    2 children       Daily Caffeine Use 2-3 cups per day     PHYSICAL EXAM  Vitals:   05/22/16 1333  BP: (!) 144/88  Pulse: (!) 108  Resp: 16  Weight: 188 lb (85.3 kg)  Height: 5' 3.5" (1.613 m)    Body mass index is 32.78 kg/m.   General: The patient is well-developed and well-nourished and in no acute distress.   HJead is [place]/AT, pharynx is nonerythematous and is Mallampatti 2.    Eyes:  Funduscopic exam shows normal optic discs and retinal vessels.  Neck: The neck is supple, no  carotid bruits are noted.  The neck is nontender.  Cardiovascular: The heart has a regular rate and rhythm with a normal S1 and S2. There were no murmurs, gallops or rubs. Lungs are clear to auscultation.  Skin: Extremities are without rash or edema. \ Neurologic Exam  Mental status: The patient is alert and oriented x 3 at the time of the examination. The patient has apparent normal recent and remote memory, with an apparently normal attention span and concentration ability.   Speech is normal.  Cranial nerves: Extraocular movements are full. Pupils are equal, round, and reactive to light and accomodation.  Visual fields are full.  Facial symmetry is present. There is good facial sensation to soft touch bilaterally.Facial strength is normal.  Trapezius and sternocleidomastoid strength is normal. No dysarthria is noted.  The tongue is midline, and the patient has symmetric elevation of the soft palate. No obvious hearing deficits are noted.  Motor:  Muscle bulk is normal.   Tone is normal. Strength is  5 / 5 in all 4 extremities.   Sensory: Sensory testing is intact to pinprick, soft touch and vibration sensation in all 4 extremities.  Coordination: Cerebellar testing reveals good finger-nose-finger and heel-to-shin bilaterally.  Gait and station: Station is normal.   Gait is normal. Tandem gait is normal. Romberg is negative.   Reflexes: Deep tendon reflexes are symmetric and normal bilaterally.   Plantar responses are flexor.    DIAGNOSTIC DATA (LABS, IMAGING, TESTING) - I reviewed patient records, labs, notes, testing and imaging myself where available.  Lab Results  Component Value Date   WBC 8.9 04/26/2016   HGB 15.3 04/26/2016   HCT 45.0 04/26/2016   MCV 92.5 04/26/2016   PLT 251 04/26/2016      Component Value Date/Time   NA 137 04/26/2016 1514   K 4.3 04/26/2016 1514   CL 100 (L) 04/26/2016 1514   CO2 27 04/26/2016 1455   GLUCOSE 213 (H) 04/26/2016 1514   BUN 19  04/26/2016 1514   CREATININE 1.10 04/26/2016 1514   CALCIUM 8.8 (L) 04/26/2016 1455   PROT 6.4 (L) 04/26/2016 1455   ALBUMIN 4.0 04/26/2016 1455   AST 33 04/26/2016  1455   ALT 63 04/26/2016 1455   ALKPHOS 80 04/26/2016 1455   BILITOT 0.4 04/26/2016 1455   GFRNONAA >60 04/26/2016 1455   GFRAA >60 04/26/2016 1455   Lab Results  Component Value Date   CHOL 208 (H) 07/22/2015   HDL 31.60 (L) 07/22/2015   LDLCALC 139 (H) 03/04/2015   LDLDIRECT 114.0 07/22/2015   TRIG 292.0 (H) 07/22/2015   CHOLHDL 7 07/22/2015   Lab Results  Component Value Date   HGBA1C 8.8 (H) 04/06/2016   Lab Results  Component Value Date   YHCWCBJS28 315 04/06/2016   Lab Results  Component Value Date   TSH 1.88 04/06/2016       ASSESSMENT AND PLAN  Lacunar infarction ([place]) - Plan: Sedimentation rate, C-reactive protein, ANA w/Reflex, MR BRAIN WO CONTRAST, MR MRA HEAD WO CONTRAST, ECHOCARDIOGRAM COMPLETE, US Carotid Bilateral  Other headache syndrome - Plan: Sedimentation rate, C-reactive protein, ANA w/Reflex, MR BRAIN WO CONTRAST  OSA (obstructive sleep apnea)  Essential hypertension   In summary, Mr. Yamada is a 41 year old man who had a severe headache last month associated with elevated blood pressure was found to CT scan to have prior lacunar infarctions. The focus in the right internal capsule could have explained his symptoms from 10 years ago and is very consistent with a lacunar infarction.    One of the foci likely represents a Virchow-Robin space but the others are more likely to be lacunar infarctions. We need to better characterize his abnormalities on the CT scan and determine if he has other risk factors for strokes that need to be treated in a different manner.  He'll have an MRI of the brain without contrast and MR angiogram of the intracranial arteries eyes the abnormalities and to determine if he has intracranial arterial stenosis. We will check some blood work to rule out vasculitis  and elevated homocysteine. An echocardiogram with bubble contrast will be performed to rule out a patent foramen ovale or atrial septal defect.  A carotid Doppler study will be performed to determine if he has significant stenosis in either the carotid arteries. Further treatment or intervention may be necessary based on the results of the studies.    For the time being, he will continue on the aspirin.  I will see him back in 6 weeks or sooner if there are new or worsening neurologic symptoms. We will let him know the results of the studies as we get results  Thank you for asking me to see Tyler Hoffman. Please let me know if I can be of further assistance with him or other patients in the future.   Jonnathan Birman A. Felecia Shelling, MD, PhD 1/76/1607, 3:71 PM Certified in Neurology, Clinical Neurophysiology, Sleep Medicine, Pain Medicine and Neuroimaging  [facility] [address], [place], [place] [address] [phone]

## 2016-05-23 ENCOUNTER — Encounter: Payer: Self-pay | Admitting: Family Medicine

## 2016-05-23 ENCOUNTER — Telehealth: Payer: Self-pay | Admitting: Neurology

## 2016-05-23 LAB — C-REACTIVE PROTEIN: CRP: 20.4 mg/L — ABNORMAL HIGH (ref 0.0–4.9)

## 2016-05-23 LAB — ANA W/REFLEX: Anti Nuclear Antibody(ANA): NEGATIVE

## 2016-05-23 LAB — SEDIMENTATION RATE: SED RATE: 9 mm/h (ref 0–15)

## 2016-05-23 MED ORDER — ALPRAZOLAM 0.5 MG PO TABS: ORAL_TABLET | ORAL | 0 refills | Status: DC

## 2016-05-23 NOTE — Telephone Encounter (Signed)
[employer] did not approve the MRI & MRA stated needed more clinical information. The phone number for the peer to peer is [phone] and the member ID is [id number] & DOB is 03/29/1975. Please do in 2 business days. Thank you for your help!

## 2016-05-23 NOTE — Telephone Encounter (Signed)
RAS completed peer to peer.  MRI/MRA approved.  PA# [id number]/fim

## 2016-05-23 NOTE — Telephone Encounter (Signed)
Alprazplam rx. printed in error; destroyed/fim

## 2016-05-24 ENCOUNTER — Ambulatory Visit (INDEPENDENT_AMBULATORY_CARE_PROVIDER_SITE_OTHER): Payer: BC Managed Care – PPO

## 2016-05-24 DIAGNOSIS — I639 Cerebral infarction, unspecified: Secondary | ICD-10-CM | POA: Diagnosis not present

## 2016-05-24 DIAGNOSIS — I6381 Other cerebral infarction due to occlusion or stenosis of small artery: Secondary | ICD-10-CM

## 2016-05-24 NOTE — Telephone Encounter (Signed)
Noted, thank you for your help!  °

## 2016-06-01 ENCOUNTER — Other Ambulatory Visit: Payer: Self-pay | Admitting: Family Medicine

## 2016-06-01 ENCOUNTER — Telehealth: Payer: Self-pay | Admitting: *Deleted

## 2016-06-01 MED ORDER — GLUCOSE BLOOD VI STRP: ORAL_STRIP | 11 refills | Status: DC

## 2016-06-01 MED ORDER — PEN NEEDLES 30G X 8 MM MISC: 10.0000 [IU] | Freq: Every day | 11 refills | Status: DC

## 2016-06-01 MED ORDER — INSULIN DETEMIR 100 UNIT/ML FLEXPEN: 10.0000 [IU] | PEN_INJECTOR | Freq: Every day | SUBCUTANEOUS | 11 refills | Status: DC

## 2016-06-01 NOTE — Telephone Encounter (Signed)
-----   Message from Excell SeltzerAmy E Bedsole, MD sent at 06/01/2016  5:01 PM EDT ----- See pt Mychart note from today. Please call/send in one touch verio test strips .

## 2016-06-01 NOTE — Progress Notes (Signed)
Please call/send in one touch verio test strips Check CBGs 4 times daily. # quantity sufficient for 1 month, 11 refills Dx  E11.65

## 2016-06-04 ENCOUNTER — Encounter: Payer: Self-pay | Admitting: Neurology

## 2016-06-06 ENCOUNTER — Other Ambulatory Visit: Payer: BC Managed Care – PPO

## 2016-06-06 ENCOUNTER — Ambulatory Visit (INDEPENDENT_AMBULATORY_CARE_PROVIDER_SITE_OTHER): Payer: BC Managed Care – PPO

## 2016-06-06 DIAGNOSIS — G4489 Other headache syndrome: Secondary | ICD-10-CM

## 2016-06-06 DIAGNOSIS — I6381 Other cerebral infarction due to occlusion or stenosis of small artery: Secondary | ICD-10-CM

## 2016-06-06 DIAGNOSIS — I639 Cerebral infarction, unspecified: Secondary | ICD-10-CM | POA: Diagnosis not present

## 2016-06-07 ENCOUNTER — Other Ambulatory Visit: Payer: Self-pay | Admitting: Neurology

## 2016-06-07 ENCOUNTER — Other Ambulatory Visit: Payer: Self-pay

## 2016-06-07 ENCOUNTER — Other Ambulatory Visit: Payer: Self-pay | Admitting: Family Medicine

## 2016-06-07 ENCOUNTER — Telehealth: Payer: Self-pay | Admitting: *Deleted

## 2016-06-07 ENCOUNTER — Ambulatory Visit (HOSPITAL_COMMUNITY): Payer: BC Managed Care – PPO | Attending: Cardiovascular Disease

## 2016-06-07 DIAGNOSIS — I5189 Other ill-defined heart diseases: Secondary | ICD-10-CM | POA: Insufficient documentation

## 2016-06-07 DIAGNOSIS — I6381 Other cerebral infarction due to occlusion or stenosis of small artery: Secondary | ICD-10-CM

## 2016-06-07 DIAGNOSIS — I639 Cerebral infarction, unspecified: Secondary | ICD-10-CM | POA: Insufficient documentation

## 2016-06-07 HISTORY — PX: TRANSTHORACIC ECHOCARDIOGRAM: SHX275

## 2016-06-07 LAB — ECHOCARDIOGRAM COMPLETE BUBBLE STUDY
AOASC: 27 cm
Area-P 1/2: 4.31 cm2
CHL CUP MV DEC (S): 173
EERAT: 6.94
EWDT: 173 ms
FS: 38 % (ref 28–44)
IVS/LV PW RATIO, ED: 1
LA ID, A-P, ES: 36 mm
LA diam end sys: 36 mm
LA diam index: 1.91 cm/m2
LA vol A4C: 28.2 ml
LA vol index: 12.2 mL/m2
LA vol: 22.9 mL
LVEEAVG: 6.94
LVEEMED: 6.94
LVELAT: 9.37 cm/s
LVOT VTI: 18.3 cm
LVOT area: 3.14 cm2
LVOT diameter: 20 mm
LVOT peak vel: 89.4 cm/s
LVOTSV: 57 mL
MV pk A vel: 73.2 m/s
MVPKEVEL: 65 m/s
P 1/2 time: 51 ms
PW: 8 mm — AB (ref 0.6–1.1)
RV LATERAL S' VELOCITY: 9.73 cm/s
TAPSE: 16.1 mm
TDI e' lateral: 9.37
TDI e' medial: 8.15

## 2016-06-07 NOTE — Telephone Encounter (Signed)
-----   Message from Asa Lenteichard A Sater, MD sent at 06/07/2016 11:48 AM EDT ----- Please note that the echocardiogram looked good. There was no shunt in the heart.

## 2016-06-07 NOTE — Telephone Encounter (Signed)
LMOM that per RAS, echo looked good--no shunting noted.  He does not need to return this call unless he has questions./fim

## 2016-06-07 NOTE — Telephone Encounter (Signed)
Patient requesting a call back because he has questions about previous call.

## 2016-06-08 ENCOUNTER — Telehealth: Payer: Self-pay | Admitting: Neurology

## 2016-06-08 NOTE — Telephone Encounter (Signed)
I spoke to Mr. Goodner about the MRI showing multiple lacunar infarctions and the MRA showing some intracranial stenosis. He is currently on aspirin 162 mg nightly.  He is getting his hypertension and diabetes under better control.  He also has known OSA and sees Dr. Vassie Loll of pulmonology  I will have him come back to see me in 7-8 weeks.    Faith,  please schedule an appointment to have him come back in about 2 months.

## 2016-06-11 ENCOUNTER — Telehealth: Payer: Self-pay | Admitting: *Deleted

## 2016-06-11 NOTE — Telephone Encounter (Signed)
LMOM (identified vm) to call the office and sched. appt. end of May, beginning of June 2018/fim

## 2016-06-15 ENCOUNTER — Ambulatory Visit (INDEPENDENT_AMBULATORY_CARE_PROVIDER_SITE_OTHER): Payer: BC Managed Care – PPO | Admitting: Family Medicine

## 2016-06-15 ENCOUNTER — Encounter: Payer: Self-pay | Admitting: Family Medicine

## 2016-06-15 VITALS — BP 100/64 | HR 81 | Temp 98.6°F | Ht 63.5 in | Wt 186.0 lb

## 2016-06-15 DIAGNOSIS — I10 Essential (primary) hypertension: Secondary | ICD-10-CM

## 2016-06-15 DIAGNOSIS — I6381 Other cerebral infarction due to occlusion or stenosis of small artery: Secondary | ICD-10-CM

## 2016-06-15 DIAGNOSIS — E7800 Pure hypercholesterolemia, unspecified: Secondary | ICD-10-CM

## 2016-06-15 DIAGNOSIS — I639 Cerebral infarction, unspecified: Secondary | ICD-10-CM

## 2016-06-15 DIAGNOSIS — Z23 Encounter for immunization: Secondary | ICD-10-CM

## 2016-06-15 DIAGNOSIS — E1165 Type 2 diabetes mellitus with hyperglycemia: Secondary | ICD-10-CM | POA: Diagnosis not present

## 2016-06-15 DIAGNOSIS — IMO0001 Reserved for inherently not codable concepts without codable children: Secondary | ICD-10-CM

## 2016-06-15 NOTE — Progress Notes (Signed)
   Subjective:    Patient ID: Tyler Hoffman, male    DOB: 1975/10/25, 41 y.o.   MRN: [id number]  HPI  41 year old male presents for follow up DM and HTN  Hypertension:   Improved control BP Readings from Last 3 Encounters:  06/15/16 100/64  05/22/16 (!) 144/88  04/27/16 108/67   Using medication without problems or lightheadedness:  None Chest pain with exertion:None Edema:None Short of breath: None Average home BPs: 100-125/70-80 even at school. Other issues:  Wt Readings from Last 3 Encounters:  06/15/16 186 lb (84.4 kg)  05/22/16 188 lb (85.3 kg)  04/27/16 188 lb 8 oz (85.5 kg)    Diabetes:  On victoza 1.6 mg , insulin 10 units daily Using medications without difficulties:None Hypoglycemic episodes:none Hyperglycemic episodes: Feet problems:none Blood Sugars averaging: FBS 117-130, 2 hours after meals  160 max He has been eating more protein.. Limiting carbs overall.   HX of CVA.. Now on ASA 81 mg x 3  Per neuro. On max dose of pravastain. Review of Systems  Constitutional: Negative for fatigue and fever.  HENT: Negative for ear pain.   Eyes: Negative for pain.  Respiratory: Negative for cough and shortness of breath.   Cardiovascular: Negative for chest pain, palpitations and leg swelling.  Gastrointestinal: Negative for abdominal pain.  Genitourinary: Negative for dysuria.  Musculoskeletal: Negative for arthralgias.  Neurological: Negative for syncope, light-headedness and headaches.  Psychiatric/Behavioral: Negative for dysphoric mood.       Objective:   Physical Exam  Constitutional: He is oriented to person, place, and time. Vital signs are normal. He appears well-developed and well-nourished.  HENT:  Head: Normocephalic.  Right Ear: Hearing normal.  Left Ear: Hearing normal.  Nose: Nose normal.  Mouth/Throat: Oropharynx is clear and moist and mucous membranes are normal.  Neck: Trachea normal. Carotid bruit is not present. No thyroid mass and no  thyromegaly present.  Cardiovascular: Normal rate, regular rhythm and normal pulses.  Exam reveals no gallop, no distant heart sounds and no friction rub.   No murmur heard. No peripheral edema  Pulmonary/Chest: Effort normal and breath sounds normal. No respiratory distress.  Neurological: He is oriented to person, place, and time. He has normal strength. He is unresponsive. He is not disoriented. No cranial nerve deficit or sensory deficit. He displays a negative Romberg sign. Coordination and gait normal. GCS eye subscore is 4. GCS verbal subscore is 5. GCS motor subscore is 6.  Skin: Skin is warm, dry and intact. No rash noted.  Psychiatric: He has a normal mood and affect. His speech is normal and behavior is normal. Thought content normal.          Assessment & Plan:

## 2016-06-15 NOTE — Patient Instructions (Addendum)
Keep up great work on healthy eating habits. Try to add exercise ads able.  Goal fasting blood sugar < 120 and 2 hour post prandial < 160. Continue current dose of insulin and victoza.  Set up yearly eye exam.

## 2016-06-15 NOTE — Assessment & Plan Note (Signed)
Now taking max dose pravastatin regularly... Goal LDL < 70  Given lacunar infarct in past.

## 2016-06-15 NOTE — Assessment & Plan Note (Signed)
Excellent control at home and at work on current regimen.

## 2016-06-15 NOTE — Assessment & Plan Note (Signed)
Now on ASA x 3 tabs. Needs HTN, chol and DM control.

## 2016-06-15 NOTE — Assessment & Plan Note (Signed)
Much better control of DM on insulin and victoza. Re-eval A1C in 2 months.  Encouraged exercise, weight loss, healthy eating habits.

## 2016-06-15 NOTE — Addendum Note (Signed)
Addended by: Damita Lack on: 06/15/2016 10:13 AM   Modules accepted: Orders

## 2016-06-15 NOTE — Progress Notes (Signed)
Pre visit review using our clinic review tool, if applicable. No additional management support is needed unless otherwise documented below in the visit note. 

## 2016-08-03 ENCOUNTER — Encounter: Payer: Self-pay | Admitting: Pulmonary Disease

## 2016-08-14 ENCOUNTER — Telehealth: Payer: Self-pay | Admitting: Family Medicine

## 2016-08-14 DIAGNOSIS — E1165 Type 2 diabetes mellitus with hyperglycemia: Principal | ICD-10-CM

## 2016-08-14 DIAGNOSIS — IMO0001 Reserved for inherently not codable concepts without codable children: Secondary | ICD-10-CM

## 2016-08-14 NOTE — Telephone Encounter (Signed)
-----   Message from Alvina Chouerri J Walsh sent at 08/13/2016  2:29 PM EDT ----- Regarding: Lab orders for Wednesday, 6.6.18 Patient is scheduled for CPX labs, please order future labs, Thanks , Camelia Engerri

## 2016-08-15 ENCOUNTER — Other Ambulatory Visit (INDEPENDENT_AMBULATORY_CARE_PROVIDER_SITE_OTHER): Payer: BC Managed Care – PPO

## 2016-08-15 DIAGNOSIS — IMO0001 Reserved for inherently not codable concepts without codable children: Secondary | ICD-10-CM

## 2016-08-15 DIAGNOSIS — E1165 Type 2 diabetes mellitus with hyperglycemia: Secondary | ICD-10-CM | POA: Diagnosis not present

## 2016-08-15 LAB — COMPREHENSIVE METABOLIC PANEL
ALK PHOS: 62 U/L (ref 39–117)
ALT: 39 U/L (ref 0–53)
AST: 28 U/L (ref 0–37)
Albumin: 4.5 g/dL (ref 3.5–5.2)
BILIRUBIN TOTAL: 0.7 mg/dL (ref 0.2–1.2)
BUN: 21 mg/dL (ref 6–23)
CO2: 33 meq/L — AB (ref 19–32)
Calcium: 9.7 mg/dL (ref 8.4–10.5)
Chloride: 100 mEq/L (ref 96–112)
Creatinine, Ser: 0.95 mg/dL (ref 0.40–1.50)
GFR: 92.44 mL/min (ref >60.00)
GLUCOSE: 174 mg/dL — AB (ref 70–99)
Potassium: 3.8 mEq/L (ref 3.5–5.1)
SODIUM: 138 meq/L (ref 135–145)
TOTAL PROTEIN: 6.9 g/dL (ref 6.0–8.3)

## 2016-08-15 LAB — LIPID PANEL
CHOL/HDL RATIO: 4
Cholesterol: 152 mg/dL (ref 0–200)
HDL: 41.5 mg/dL (ref >39.00)
LDL Cholesterol: 85 mg/dL (ref 0–99)
NONHDL: 110.17
Triglycerides: 125 mg/dL (ref 0.0–149.0)
VLDL: 25 mg/dL (ref 0.0–40.0)

## 2016-08-15 LAB — HEMOGLOBIN A1C: HEMOGLOBIN A1C: 7.3 % — AB (ref 4.6–6.5)

## 2016-08-17 ENCOUNTER — Encounter: Payer: Self-pay | Admitting: Family Medicine

## 2016-08-17 ENCOUNTER — Ambulatory Visit (INDEPENDENT_AMBULATORY_CARE_PROVIDER_SITE_OTHER): Payer: BC Managed Care – PPO | Admitting: Family Medicine

## 2016-08-17 VITALS — BP 100/62 | HR 96 | Temp 98.3°F | Ht 63.5 in | Wt 178.8 lb

## 2016-08-17 DIAGNOSIS — E7800 Pure hypercholesterolemia, unspecified: Secondary | ICD-10-CM

## 2016-08-17 DIAGNOSIS — G4733 Obstructive sleep apnea (adult) (pediatric): Secondary | ICD-10-CM | POA: Diagnosis not present

## 2016-08-17 DIAGNOSIS — L718 Other rosacea: Secondary | ICD-10-CM | POA: Diagnosis not present

## 2016-08-17 DIAGNOSIS — I10 Essential (primary) hypertension: Secondary | ICD-10-CM

## 2016-08-17 DIAGNOSIS — I639 Cerebral infarction, unspecified: Secondary | ICD-10-CM | POA: Diagnosis not present

## 2016-08-17 DIAGNOSIS — Z0000 Encounter for general adult medical examination without abnormal findings: Secondary | ICD-10-CM

## 2016-08-17 DIAGNOSIS — Z111 Encounter for screening for respiratory tuberculosis: Secondary | ICD-10-CM | POA: Diagnosis not present

## 2016-08-17 DIAGNOSIS — E1165 Type 2 diabetes mellitus with hyperglycemia: Secondary | ICD-10-CM | POA: Diagnosis not present

## 2016-08-17 DIAGNOSIS — I6381 Other cerebral infarction due to occlusion or stenosis of small artery: Secondary | ICD-10-CM

## 2016-08-17 DIAGNOSIS — IMO0001 Reserved for inherently not codable concepts without codable children: Secondary | ICD-10-CM

## 2016-08-17 LAB — POC URINALSYSI DIPSTICK (AUTOMATED)
Bilirubin, UA: NEGATIVE
Ketones, UA: NEGATIVE
Leukocytes, UA: NEGATIVE
Nitrite, UA: NEGATIVE
Protein, UA: NEGATIVE
RBC UA: NEGATIVE
SPEC GRAV UA: 1.02 (ref 1.010–1.025)
UROBILINOGEN UA: 0.2 U/dL
pH, UA: 6 (ref 5.0–8.0)

## 2016-08-17 NOTE — Assessment & Plan Note (Signed)
Well controlled. Continue current medication.  

## 2016-08-17 NOTE — Assessment & Plan Note (Signed)
Continue asa 81 mg x 2 daily. No further eval needed.  needs LDL < 70

## 2016-08-17 NOTE — Assessment & Plan Note (Signed)
Stable on CPAP 

## 2016-08-17 NOTE — Progress Notes (Signed)
Subjective:    Patient ID: Tyler Hoffman, male    DOB: 04/22/1975, 41 y.o.   MRN: [id number]  HPI  The patient presents for complete physical and review of chronic health problems.   He has accepted a job in [place]. 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  Diabetes:  Improved control on current regimen : victoza 1.8 mg daily, Levemir 13 units daily. Lab Results  Component Value Date   HGBA1C 7.3 (H) 08/15/2016  Using medications without difficulties:  Hypoglycemic episodes:none Hyperglycemic episodes: none Feet problems:none Blood Sugars averaging: FBS:120-160 eye exam within last year: due He has made a lot of improvement in diet. He has lost 10 lbs in last month on victoza. Wt Readings from Last 3 Encounters:  08/17/16 178 lb 12 oz (81.1 kg)  06/15/16 186 lb (84.4 kg)  05/22/16 188 lb (85.3 kg)     Hypertension:   At goal on HCTZ Using medication without problems or lightheadedness:  none Chest pain with exertion:none Edema:none Short of breath:none Average home BPs: at goal Other issues:  Elevated Cholesterol:  LDL almost at goal < 70 on pravastatin 80  hx of lacunar CVA. Lab Results  Component Value Date   CHOL 152 08/15/2016   HDL 41.50 08/15/2016   LDLCALC 85 08/15/2016   LDLDIRECT 114.0 07/22/2015   TRIG 125.0 08/15/2016   CHOLHDL 4 08/15/2016  Using medications without problems:none Muscle aches: none Diet compliance: good Exercise: 8-12,0000 steps a day Other complaints:     Social History /Family History/Past Medical History reviewed in detail and updated in EMR if needed. Blood pressure 100/62, pulse 96, temperature 98.3 F (36.8 C), temperature source Oral, height 5' 3.5" (1.613 m), weight 178 lb 12 oz (81.1 kg).  Review of Systems  Constitutional: Negative for fatigue and fever.  HENT: Negative for ear pain.   Eyes: Negative for pain.  Respiratory: Negative for cough and shortness of breath.   Cardiovascular: Negative for  chest pain, palpitations and leg swelling.  Gastrointestinal: Negative for abdominal pain.  Genitourinary: Negative for dysuria.  Musculoskeletal: Negative for arthralgias.  Neurological: Negative for syncope, light-headedness and headaches.  Psychiatric/Behavioral: Negative for dysphoric mood.       Objective:   Physical Exam  Constitutional: He appears well-developed and well-nourished.  Non-toxic appearance. He does not appear ill. No distress.  HENT:  Head: Normocephalic and atraumatic.  Right Ear: Hearing, tympanic membrane, external ear and ear canal normal.  Left Ear: Hearing, tympanic membrane, external ear and ear canal normal.  Nose: Nose normal.  Mouth/Throat: Uvula is midline, oropharynx is clear and moist and mucous membranes are normal.  Eyes: Conjunctivae, EOM and lids are normal. Pupils are equal, round, and reactive to light. Lids are everted and swept, no foreign bodies found.  Neck: Trachea normal, normal range of motion and phonation normal. Neck supple. Carotid bruit is not present. No thyroid mass and no thyromegaly present.  Cardiovascular: Normal rate, regular rhythm, S1 normal, S2 normal, intact distal pulses and normal pulses.  Exam reveals no gallop.   No murmur heard. Pulmonary/Chest: Breath sounds normal. He has no wheezes. He has no rhonchi. He has no rales.  Abdominal: Soft. Normal appearance and bowel sounds are normal. There is no hepatosplenomegaly. There is no tenderness. There is no rebound, no guarding and no CVA tenderness. No hernia.  Lymphadenopathy:    He has no cervical adenopathy.  Neurological: He is alert. He has normal strength and normal reflexes. No cranial nerve deficit  or sensory deficit. Gait normal.  Skin: Skin is warm, dry and intact. No rash noted.  Psychiatric: He has a normal mood and affect. His speech is normal and behavior is normal. Judgment normal.      Diabetic foot exam: Normal inspection No skin breakdown No calluses    Normal DP pulses Normal sensation to light touch and monofilament Nails normal     Assessment & Plan:  The patient's preventative maintenance and recommended screening tests for an annual wellness exam were reviewed in full today. Brought up to date unless services declined.  Counselled on the importance of diet, exercise, and its role in overall health and mortality. The patient's FH and SH was reviewed, including their home life, tobacco status, and drug and alcohol status.   Vaccines: uptodate No early family history of prostate or colon cancer Smoking Status: none ETOH/ drug use: rare/ none  HIV screen:   refused

## 2016-08-17 NOTE — Assessment & Plan Note (Signed)
Encouraged exercise, weight loss, healthy eating habits.   Almost at goal LDL < 70 on statin.

## 2016-08-17 NOTE — Patient Instructions (Addendum)
Increase to levemir 16 units daily.  Follow CBGs at home. Continue work on healthy eating and regular exercsie. Make appointment for year eye exam. Return on Monday for ppd reading... We will have paperwork ready for you at that time.

## 2016-08-17 NOTE — Assessment & Plan Note (Signed)
Improved with victoza and weight loss.

## 2016-08-20 ENCOUNTER — Telehealth: Payer: Self-pay | Admitting: *Deleted

## 2016-08-20 LAB — TB SKIN TEST
Induration: 0 mm
TB SKIN TEST: NEGATIVE

## 2016-08-20 NOTE — Telephone Encounter (Signed)
Left message for [name], that his paperwork has not been completed yet and I will call him as soon as it is ready to be picked up.

## 2016-08-20 NOTE — Telephone Encounter (Signed)
-----   Message from Baldomero LamyNatasha C Chavers sent at 08/20/2016  8:14 AM EDT ----- Regarding: Pt will pick up papwrwork for him and his wife this afternoon. Good Morning!  This pt came in at 7:30 for his TB read, he said he'd be back later this afternoon for his paperwork from Dr Ermalene SearingBedsole. He had to rush of to get to his school graduation.  Thanks Rodney Boozeasha

## 2016-08-21 ENCOUNTER — Telehealth: Payer: Self-pay | Admitting: Pulmonary Disease

## 2016-08-21 NOTE — Telephone Encounter (Signed)
[facility]. Per RA, D/L shows good control on 14cm and good usage.

## 2016-08-22 ENCOUNTER — Other Ambulatory Visit: Payer: BC Managed Care – PPO

## 2016-08-24 ENCOUNTER — Encounter: Payer: BC Managed Care – PPO | Admitting: Family Medicine

## 2016-08-24 ENCOUNTER — Encounter: Payer: Self-pay | Admitting: *Deleted

## 2016-10-12 ENCOUNTER — Other Ambulatory Visit: Payer: Self-pay

## 2016-10-12 MED ORDER — LIRAGLUTIDE 18 MG/3ML ~~LOC~~ SOPN: 1.8000 mg | PEN_INJECTOR | Freq: Every day | SUBCUTANEOUS | 5 refills | Status: DC

## 2016-10-12 MED ORDER — HYDROCHLOROTHIAZIDE 25 MG PO TABS: 25.0000 mg | ORAL_TABLET | Freq: Every day | ORAL | 3 refills | Status: DC

## 2016-10-12 MED ORDER — INSULIN DETEMIR 100 UNIT/ML FLEXPEN: 16.0000 [IU] | PEN_INJECTOR | Freq: Every day | SUBCUTANEOUS | 11 refills | Status: DC

## 2016-10-12 MED ORDER — PRAVASTATIN SODIUM 80 MG PO TABS: 80.0000 mg | ORAL_TABLET | Freq: Every day | ORAL | 3 refills | Status: DC

## 2016-10-12 MED ORDER — PEN NEEDLES 30G X 8 MM MISC: 10.0000 [IU] | Freq: Every day | 11 refills | Status: AC

## 2016-10-12 NOTE — Telephone Encounter (Signed)
Pt left /vm; going to be traveling over seas and wanted Dr Ermalene SearingBedsole to know now taking Levemir 16 units. Pt requesting hand written rxs. Did not leave name of meds. I spoke with pt and wants refill with new instructions of levemir to [facility](Done); annual done on 08/17/16. Pt also wants printed rx for levemir,victoza,pravastatin and HCTZ to take to [facility] [place]. Pt request cb when rxs ready for pick up.

## 2016-10-12 NOTE — Telephone Encounter (Signed)
[name] notified that his prescriptions are ready to be picked up at the front desk.

## 2016-10-19 ENCOUNTER — Telehealth: Payer: Self-pay | Admitting: *Deleted

## 2016-10-19 ENCOUNTER — Encounter: Payer: Self-pay | Admitting: Family Medicine

## 2016-10-19 ENCOUNTER — Encounter: Payer: Self-pay | Admitting: Neurology

## 2016-10-19 ENCOUNTER — Encounter: Payer: Self-pay | Admitting: Pulmonary Disease

## 2016-10-19 MED ORDER — LIRAGLUTIDE 18 MG/3ML ~~LOC~~ SOPN: 1.8000 mg | PEN_INJECTOR | Freq: Every day | SUBCUTANEOUS | 2 refills | Status: DC

## 2016-10-19 NOTE — Telephone Encounter (Signed)
LVM unable to reach pt. 

## 2016-10-19 NOTE — Telephone Encounter (Signed)
I will ask MR to review this request and will forward pt's request to Dr. Epimenio FootSater.

## 2016-10-19 NOTE — Telephone Encounter (Signed)
Dr. Vassie LollAlva please see pt's email below. Thanks.   ----------------------------------------------  Good morning Dr. Filbert Bertholdajesh,  I am moving to [place] later next week, but will be [place] the states each July for medical "maintenance". Is it possible for you to provide me with a printed copy of a prescription for cpap supplies etc. so that I can order new masks, etc, from online vendors while overseas?   Much appreciated,  Tyler MilanAndrew Hoffman

## 2016-10-19 NOTE — Telephone Encounter (Signed)
Okay to provide written prescription for CPAP supplies

## 2016-10-24 ENCOUNTER — Other Ambulatory Visit: Payer: Self-pay

## 2016-10-24 DIAGNOSIS — G4733 Obstructive sleep apnea (adult) (pediatric): Secondary | ICD-10-CM

## 2018-12-11 HISTORY — PX: TRANSTHORACIC ECHOCARDIOGRAM: SHX275

## 2019-09-09 LAB — HM DIABETES EYE EXAM

## 2019-09-14 LAB — HM DIABETES FOOT EXAM

## 2019-09-22 ENCOUNTER — Encounter: Payer: Self-pay | Admitting: Family Medicine

## 2019-09-28 ENCOUNTER — Telehealth: Payer: Self-pay | Admitting: *Deleted

## 2019-09-28 MED ORDER — INSULIN DETEMIR 100 UNIT/ML FLEXPEN: 16.0000 [IU] | PEN_INJECTOR | Freq: Every day | SUBCUTANEOUS | 0 refills | Status: DC

## 2019-09-28 NOTE — Telephone Encounter (Signed)
Patient called stating that he has an appointment scheduled with Dr. Ermalene Searing 10/01/19. Patient stated that he needs enough of his Levimer to last him until he goes back to [place] 10/06/19 Patient stated that he turns his dial up to 60 and uses it once a day. Patient stated that he just enough to last him until he returns to [place]. Patient stated that his last A1C was 7.1 [facility] [address]

## 2019-09-28 NOTE — Telephone Encounter (Signed)
Refill sent as requested. 

## 2019-10-01 ENCOUNTER — Other Ambulatory Visit: Payer: Self-pay

## 2019-10-01 ENCOUNTER — Other Ambulatory Visit (INDEPENDENT_AMBULATORY_CARE_PROVIDER_SITE_OTHER): Payer: 59

## 2019-10-01 ENCOUNTER — Encounter: Payer: Self-pay | Admitting: Family Medicine

## 2019-10-01 ENCOUNTER — Ambulatory Visit (INDEPENDENT_AMBULATORY_CARE_PROVIDER_SITE_OTHER): Payer: 59 | Admitting: Family Medicine

## 2019-10-01 ENCOUNTER — Telehealth: Payer: Self-pay | Admitting: Family Medicine

## 2019-10-01 VITALS — BP 104/66 | HR 96 | Temp 97.7°F | Ht 63.5 in | Wt 193.5 lb

## 2019-10-01 DIAGNOSIS — E1159 Type 2 diabetes mellitus with other circulatory complications: Secondary | ICD-10-CM

## 2019-10-01 DIAGNOSIS — Z125 Encounter for screening for malignant neoplasm of prostate: Secondary | ICD-10-CM

## 2019-10-01 DIAGNOSIS — Z6833 Body mass index (BMI) 33.0-33.9, adult: Secondary | ICD-10-CM

## 2019-10-01 DIAGNOSIS — E785 Hyperlipidemia, unspecified: Secondary | ICD-10-CM

## 2019-10-01 DIAGNOSIS — Z1159 Encounter for screening for other viral diseases: Secondary | ICD-10-CM

## 2019-10-01 DIAGNOSIS — I10 Essential (primary) hypertension: Secondary | ICD-10-CM

## 2019-10-01 DIAGNOSIS — E6609 Other obesity due to excess calories: Secondary | ICD-10-CM

## 2019-10-01 DIAGNOSIS — E7800 Pure hypercholesterolemia, unspecified: Secondary | ICD-10-CM

## 2019-10-01 DIAGNOSIS — E1169 Type 2 diabetes mellitus with other specified complication: Secondary | ICD-10-CM | POA: Diagnosis not present

## 2019-10-01 LAB — COMPREHENSIVE METABOLIC PANEL
ALT: 39 U/L (ref 0–53)
AST: 24 U/L (ref 0–37)
Albumin: 4.1 g/dL (ref 3.5–5.2)
Alkaline Phosphatase: 90 U/L (ref 39–117)
BUN: 15 mg/dL (ref 6–23)
CO2: 27 mEq/L (ref 19–32)
Calcium: 9.4 mg/dL (ref 8.4–10.5)
Chloride: 102 mEq/L (ref 96–112)
Creatinine, Ser: 0.88 mg/dL (ref 0.40–1.50)
GFR: 93.63 mL/min (ref >60.00)
Glucose, Bld: 255 mg/dL — ABNORMAL HIGH (ref 70–99)
Potassium: 4.4 mEq/L (ref 3.5–5.1)
Sodium: 138 mEq/L (ref 135–145)
Total Bilirubin: 0.5 mg/dL (ref 0.2–1.2)
Total Protein: 6.4 g/dL (ref 6.0–8.3)

## 2019-10-01 LAB — LIPID PANEL
Cholesterol: 189 mg/dL (ref 0–200)
HDL: 46.7 mg/dL (ref >39.00)
LDL Cholesterol: 112 mg/dL — ABNORMAL HIGH (ref 0–99)
NonHDL: 142.12
Total CHOL/HDL Ratio: 4
Triglycerides: 149 mg/dL (ref 0.0–149.0)
VLDL: 29.8 mg/dL (ref 0.0–40.0)

## 2019-10-01 LAB — HEMOGLOBIN A1C: Hgb A1c MFr Bld: 8.3 % — ABNORMAL HIGH (ref 4.6–6.5)

## 2019-10-01 NOTE — Patient Instructions (Addendum)
If using  only one BP given lower BP.Marland Kitchen Use lisinopril.   Restart medicaiton ( probably zetia) given by cardiology in additon to crestor.. recheck A1C and CHol in 3 months.  Follow up cardiology.  Consider praluent or repatha if cholesterol not well controlled.  Consider colon cancer screening.  Please stop at the lab to have labs drawn.  Send DM information when you can.

## 2019-10-01 NOTE — Assessment & Plan Note (Signed)
Worsened control... dicussed increasing trulicity to 3 mg weekly... may need to add mealtime insulin if not improving.Encouraged exercise, weight loss, healthy eating habits.

## 2019-10-01 NOTE — Addendum Note (Signed)
Addended by: Alvina Chou on: 10/01/2019 03:37 PM   Modules accepted: Orders

## 2019-10-01 NOTE — Progress Notes (Signed)
No critical labs need to be addressed urgently. We will discuss labs in detail at upcoming office visit.   

## 2019-10-01 NOTE — Assessment & Plan Note (Addendum)
Not at goal LDL < 70 on high dose crestor.  Discussed adding zetia .. if fails this.. consider Repatha or Praleunt

## 2019-10-01 NOTE — Assessment & Plan Note (Addendum)
Well controlled. Continue current medication. Encouraged him  To use ACEi given renal protection.

## 2019-10-01 NOTE — Telephone Encounter (Signed)
-----   Message from Alvina Chou sent at 10/01/2019  8:30 AM EDT ----- Regarding: Lab orders asap Patient is scheduled for CPX labs, please order future labs, Thanks , Camelia Eng

## 2019-10-01 NOTE — Progress Notes (Signed)
Chief Complaint  Patient presents with  . Annual Exam    History of Present Illness: HPI  The patient is here for annual wellness exam and preventative care.    He is still living in [place].. has MD there  A1C in May 7.1. Eats better in [place]... has been eating ice cream since being home.. . FBS 200-255.  Diabetes:  Previously well controlled on victoza and levemir prior to  Moving to [place].  He is now on  Levemir 60 Units daily  He is also taking Trulicity 1.5 mg weekly .Marland Kitchen but feels victoza worked better. Lab Results  Component Value Date   HGBA1C 8.3 (H) 10/01/2019  Using medications without difficulties: Hypoglycemic episodes: Hyperglycemic episodes: Feet problems: none Blood Sugars averaging: FBS 255 eye exam within last year: yes  Body mass index is 33.74 kg/m.  Wt Readings from Last 3 Encounters:  10/01/19 193 lb 8 oz (87.8 kg)  08/17/16 178 lb 12 oz (81.1 kg)  06/15/16 186 lb (84.4 kg)     Hypertension:   good control on  HCTZ, holding lisinopril BP Readings from Last 3 Encounters:  10/01/19 104/66  08/17/16 100/62  06/15/16 100/64  Using medication without problems or lightheadedness: none Chest pain with exertion: none Edema:none Short of breath:none Average home BPs: good Other issues:  Elevated Cholesterol:  Not at goal on crestor 40 mg daily Lab Results  Component Value Date   CHOL 189 10/01/2019   HDL 46.70 10/01/2019   LDLCALC 112 (H) 10/01/2019   LDLDIRECT 114.0 07/22/2015   TRIG 149.0 10/01/2019   CHOLHDL 4 10/01/2019  Using medications without problems: Muscle aches:  Diet compliance: poor recently Exercise: walking daily Other complaints: Family history of premature CAD Personal history of lacunar infarct   Has upcoming stress test with cardiology.   This visit occurred during the SARS-CoV-2 public health emergency.  Safety protocols were in place, including screening questions prior to the visit, additional usage of staff PPE,  and extensive cleaning of exam room while observing appropriate contact time as indicated for disinfecting solutions.   COVID 19 screen:  No recent travel or known exposure to COVID19 The patient denies respiratory symptoms of COVID 19 at this time. The importance of social distancing was discussed today.     ROS    Past Medical History:  Diagnosis Date  . Diabetes mellitus   . Diabetes mellitus (HCC)   . Dyslipidemia   . History of rosacea   . Obesity, unspecified   . OSA (obstructive sleep apnea)   . Unspecified sleep apnea     reports that he has never smoked. He has never used smokeless tobacco. He reports current alcohol use. He reports that he does not use drugs.   Current Outpatient Medications:  .  aspirin 81 MG tablet, Take 162 mg by mouth daily. , Disp: , Rfl:  .  Dulaglutide (TRULICITY) 1.5 MG/0.5ML SOPN, Inject 1.5 mg into the skin once a week., Disp: , Rfl:  .  glucose blood (ONETOUCH VERIO) test strip, Use to check blood sugar 4 times a day.  Dx: E11.65., Disp: 120 each, Rfl: 11 .  hydrochlorothiazide (MICROZIDE) 12.5 MG capsule, Take 12.5 mg by mouth daily., Disp: , Rfl:  .  insulin detemir (LEVEMIR) 100 UNIT/ML injection, Inject 60 Units into the skin daily., Disp: , Rfl:  .  Insulin Pen Needle (PEN NEEDLES) 30G X 8 MM MISC, Inject 10 Units into the skin daily. Pen needles for Levemir Flex Pen, Disp:  100 each, Rfl: 11 .  lisinopril (ZESTRIL) 10 MG tablet, Take 10 mg by mouth daily., Disp: , Rfl:  .  rosuvastatin (CRESTOR) 40 MG tablet, Take 40 mg by mouth daily., Disp: , Rfl:  .  liraglutide (VICTOZA) 18 MG/3ML SOPN, Inject 0.3 mLs (1.8 mg total) into the skin daily. (Patient not taking: Reported on 10/01/2019), Disp: 6 mL, Rfl: 2   Observations/Objective: Blood pressure 104/66, pulse 96, temperature 97.7 F (36.5 C), temperature source Temporal, height 5' 3.5" (1.613 m), weight 193 lb 8 oz (87.8 kg), SpO2 97 %.  Physical Exam Constitutional:      Appearance: He  is well-developed. He is obese.  HENT:     Head: Normocephalic.     Right Ear: Hearing normal.     Left Ear: Hearing normal.     Nose: Nose normal.  Neck:     Thyroid: No thyroid mass or thyromegaly.     Vascular: No carotid bruit.     Trachea: Trachea normal.  Cardiovascular:     Rate and Rhythm: Normal rate and regular rhythm.     Pulses: Normal pulses.     Heart sounds: Heart sounds not distant. No murmur heard.  No friction rub. No gallop.      Comments: No peripheral edema Pulmonary:     Effort: Pulmonary effort is normal. No respiratory distress.     Breath sounds: Normal breath sounds.  Skin:    General: Skin is warm and dry.     Findings: No rash.  Psychiatric:        Speech: Speech normal.        Behavior: Behavior normal.        Thought Content: Thought content normal.     Diabetic foot exam: Normal inspection No skin breakdown No calluses  Normal DP pulses Normal sensation to light touch and monofilament Nails normal  Assessment and Plan   The patient's preventative maintenance and recommended screening tests for an annual wellness exam were reviewed in full today. Brought up to date unless services declined.  Counselled on the importance of diet, exercise, and its role in overall health and mortality. The patient's FH and SH was reviewed, including their home life, tobacco status, and drug and alcohol status.   Vaccines: uptodate, S/P COVID19 vaccine No early family history of prostate or colon cancer.. discussed starting colon cancer screening this year Smoking Status: none ETOH/ drug use: rare/ none HIV screen:  refused  Hyperlipidemia associated with type 2 diabetes mellitus (HCC)  Not at goal LDL < 70 on high dose crestor.  Discussed adding zetia .. if fails this.. consider Repatha or Praleunt  Hypertension associated with diabetes (HCC) Well controlled. Continue current medication. Encouraged him  To use ACEi given renal  protection.   Diabetes mellitus with circulatory complication, without long-term current use of insulin (HCC) Worsened control... dicussed increasing trulicity to 3 mg weekly... may need to add mealtime insulin if not improving.Encouraged exercise, weight loss, healthy eating habits.     Kerby Nora, MD

## 2019-10-02 LAB — HEPATITIS C ANTIBODY
Hepatitis C Ab: NONREACTIVE
Hepatitis C Ab: NONREACTIVE
SIGNAL TO CUT-OFF: 0 (ref <1.00)
SIGNAL TO CUT-OFF: 0 (ref <1.00)

## 2019-10-02 LAB — MICROALBUMIN / CREATININE URINE RATIO
Creatinine,U: 103.8 mg/dL
Microalb Creat Ratio: 0.7 mg/g (ref 0.0–30.0)
Microalb, Ur: 0.7 mg/dL (ref 0.0–1.9)

## 2019-10-04 ENCOUNTER — Other Ambulatory Visit: Payer: Self-pay

## 2019-10-04 ENCOUNTER — Emergency Department (HOSPITAL_COMMUNITY)
Admission: EM | Admit: 2019-10-04 | Discharge: 2019-10-05 | Disposition: A | Discharge status: Home / Self Care | Payer: 59 | Attending: Emergency Medicine | Admitting: Emergency Medicine

## 2019-10-04 DIAGNOSIS — I10 Essential (primary) hypertension: Secondary | ICD-10-CM | POA: Diagnosis not present

## 2019-10-04 DIAGNOSIS — Z794 Long term (current) use of insulin: Secondary | ICD-10-CM | POA: Diagnosis not present

## 2019-10-04 DIAGNOSIS — R112 Nausea with vomiting, unspecified: Secondary | ICD-10-CM | POA: Diagnosis not present

## 2019-10-04 DIAGNOSIS — T781XXA Other adverse food reactions, not elsewhere classified, initial encounter: Secondary | ICD-10-CM | POA: Diagnosis not present

## 2019-10-04 DIAGNOSIS — Z7982 Long term (current) use of aspirin: Secondary | ICD-10-CM | POA: Diagnosis not present

## 2019-10-04 DIAGNOSIS — Z79899 Other long term (current) drug therapy: Secondary | ICD-10-CM | POA: Diagnosis not present

## 2019-10-04 DIAGNOSIS — E119 Type 2 diabetes mellitus without complications: Secondary | ICD-10-CM | POA: Insufficient documentation

## 2019-10-04 DIAGNOSIS — T7840XA Allergy, unspecified, initial encounter: Secondary | ICD-10-CM

## 2019-10-04 DIAGNOSIS — R197 Diarrhea, unspecified: Secondary | ICD-10-CM | POA: Insufficient documentation

## 2019-10-04 DIAGNOSIS — R21 Rash and other nonspecific skin eruption: Secondary | ICD-10-CM | POA: Diagnosis present

## 2019-10-04 DIAGNOSIS — D72829 Elevated white blood cell count, unspecified: Secondary | ICD-10-CM | POA: Diagnosis not present

## 2019-10-04 LAB — COMPREHENSIVE METABOLIC PANEL
ALT: 41 U/L (ref 0–44)
AST: 25 U/L (ref 15–41)
Albumin: 3.9 g/dL (ref 3.5–5.0)
Alkaline Phosphatase: 71 U/L (ref 38–126)
Anion gap: 10 (ref 5–15)
BUN: 24 mg/dL — ABNORMAL HIGH (ref 6–20)
CO2: 20 mmol/L — ABNORMAL LOW (ref 22–32)
Calcium: 8.5 mg/dL — ABNORMAL LOW (ref 8.9–10.3)
Chloride: 109 mmol/L (ref 98–111)
Creatinine, Ser: 1.02 mg/dL (ref 0.61–1.24)
GFR calc Af Amer: >60 mL/min (ref >60)
GFR calc non Af Amer: >60 mL/min (ref >60)
Glucose, Bld: 286 mg/dL — ABNORMAL HIGH (ref 70–99)
Potassium: 4.3 mmol/L (ref 3.5–5.1)
Sodium: 139 mmol/L (ref 135–145)
Total Bilirubin: 1.1 mg/dL (ref 0.3–1.2)
Total Protein: 6.6 g/dL (ref 6.5–8.1)

## 2019-10-04 LAB — CBC WITH DIFFERENTIAL/PLATELET
Abs Immature Granulocytes: 0.06 10*3/uL (ref 0.00–0.07)
Basophils Absolute: 0 10*3/uL (ref 0.0–0.1)
Basophils Relative: 0 %
Eosinophils Absolute: 0.1 10*3/uL (ref 0.0–0.5)
Eosinophils Relative: 0 %
HCT: 54.4 % — ABNORMAL HIGH (ref 39.0–52.0)
Hemoglobin: 18.2 g/dL — ABNORMAL HIGH (ref 13.0–17.0)
Immature Granulocytes: 0 %
Lymphocytes Relative: 18 %
Lymphs Abs: 3.8 10*3/uL (ref 0.7–4.0)
MCH: 32 pg (ref 26.0–34.0)
MCHC: 33.5 g/dL (ref 30.0–36.0)
MCV: 95.6 fL (ref 80.0–100.0)
Monocytes Absolute: 1.3 10*3/uL — ABNORMAL HIGH (ref 0.1–1.0)
Monocytes Relative: 6 %
Neutro Abs: 15.5 10*3/uL — ABNORMAL HIGH (ref 1.7–7.7)
Neutrophils Relative %: 76 %
Platelets: 321 10*3/uL (ref 150–400)
RBC: 5.69 MIL/uL (ref 4.22–5.81)
RDW: 11.8 % (ref 11.5–15.5)
WBC: 20.8 10*3/uL — ABNORMAL HIGH (ref 4.0–10.5)
nRBC: 0 % (ref 0.0–0.2)

## 2019-10-04 LAB — LIPASE, BLOOD: Lipase: 32 U/L (ref 11–51)

## 2019-10-04 MED ORDER — FAMOTIDINE IN NACL 20-0.9 MG/50ML-% IV SOLN
20.0000 mg | INTRAVENOUS | Status: AC
  Administered 2019-10-04: 20 mg via INTRAVENOUS
  Filled 2019-10-04: qty 50

## 2019-10-04 MED ORDER — SODIUM CHLORIDE 0.9 % IV BOLUS
1000.0000 mL | Freq: Once | INTRAVENOUS | Status: AC
  Administered 2019-10-04: 1000 mL via INTRAVENOUS

## 2019-10-04 MED ORDER — FENTANYL CITRATE (PF) 100 MCG/2ML IJ SOLN
50.0000 ug | Freq: Once | INTRAMUSCULAR | Status: AC
  Administered 2019-10-04: 50 ug via INTRAVENOUS
  Filled 2019-10-04: qty 2

## 2019-10-04 MED ORDER — METHYLPREDNISOLONE SODIUM SUCC 125 MG IJ SOLR
125.0000 mg | Freq: Once | INTRAMUSCULAR | Status: AC
  Administered 2019-10-04: 125 mg via INTRAVENOUS
  Filled 2019-10-04: qty 2

## 2019-10-04 NOTE — ED Provider Notes (Signed)
[facility] EMERGENCY DEPARTMENT Provider Note   CSN: [id number] Arrival date & time: 10/04/19  2154     History Chief Complaint  Patient presents with   Allergic Reaction    Fredrico Beedle is a 44 y.o. male.  The history is provided by the patient and medical records.  Allergic Reaction Presenting symptoms: rash     44 year old DM, dyslipidemia, obesity, sleep apnea, presenting to the ED for allergic reaction.  States he ate [place] food earlier today around 1pm which he has eaten many times before.  Nothing about it was new.  States around 4PM he started having N/V/D and hives.  States after vomiting he continued cleaning the toilet with sanitary wipes, however has used them before either.  Nothing else is new he can recall-- no new medications, cleaners, soaps, detergents, etc.  States his tongue feels "numb" but denies swelling or trouble swallowing.  He was hypotensive with EMS to 86/40 with standing.  Given IVF, zofran, epi and benardryl.  Continues to have rash to his abdomen and upper legs bilaterally.    Past Medical History:  Diagnosis Date   Diabetes mellitus    Diabetes mellitus (HCC)    Dyslipidemia    History of rosacea    Obesity, unspecified    OSA (obstructive sleep apnea)    Unspecified sleep apnea     Patient Active Problem List   Diagnosis Date Noted   Essential hypertension 05/22/2016   Lacunar infarction (HCC) 04/27/2016   Hypertension associated with diabetes (HCC) 04/29/2014   Family history of premature CAD 04/29/2014   Obesity (BMI 30-39.9) 10/16/2013   OSA (obstructive sleep apnea) 11/05/2011   ?ADD (attention deficit disorder) 07/25/2011   Diabetes mellitus with circulatory complication, without long-term current use of insulin (HCC) 04/27/2009   CONJUNCTIVITIS, ALLERGIC 06/04/2008   Hyperlipidemia associated with type 2 diabetes mellitus (HCC) 08/22/2006    Past Surgical History:  Procedure Laterality Date    nasoplasty  08-2003       Family History  Problem Relation Age of Onset   Heart disease Mother 79   Heart disease Paternal Grandfather    Diabetes Maternal Grandfather    Heart disease Paternal Grandmother     Social History   Tobacco Use   Smoking status: Never Smoker   Smokeless tobacco: Never Used  Substance Use Topics   Alcohol use: Yes    Comment: Very rare   Drug use: No    Home Medications Prior to Admission medications   Medication Sig Start Date End Date Taking? Authorizing Provider  aspirin 81 MG tablet Take 162 mg by mouth daily.     [provider]  Dulaglutide (TRULICITY) 1.5 MG/0.5ML SOPN Inject 1.5 mg into the skin once a week.    [provider]  glucose blood (ONETOUCH VERIO) test strip Use to check blood sugar 4 times a day.  Dx: E11.65. 06/01/16   Excell Seltzer, MD  hydrochlorothiazide (MICROZIDE) 12.5 MG capsule Take 12.5 mg by mouth daily. 05/04/19   [provider]  insulin detemir (LEVEMIR) 100 UNIT/ML injection Inject 60 Units into the skin daily.    [provider]  Insulin Pen Needle (PEN NEEDLES) 30G X 8 MM MISC Inject 10 Units into the skin daily. Pen needles for Levemir Flex Pen 10/12/16   Bedsole, Amy E, MD  liraglutide (VICTOZA) 18 MG/3ML SOPN Inject 0.3 mLs (1.8 mg total) into the skin daily. Patient not taking: Reported on 10/01/2019 10/19/16   Ermalene Searing,  Amy E, MD  lisinopril (ZESTRIL) 10 MG tablet Take 10 mg by mouth daily. 05/04/19   [provider]  rosuvastatin (CRESTOR) 40 MG tablet Take 40 mg by mouth daily.    [provider]    Allergies    Metformin, Metformin and related, Pseudoephedrine, Sulfa antibiotics, Septra [sulfamethoxazole-trimethoprim], Sulfamethoxazole-trimethoprim, and Sulfonamide derivatives  Review of Systems   Review of Systems  Gastrointestinal: Positive for nausea and vomiting.  Skin: Positive for rash.  All other systems reviewed and are  negative.   Physical Exam Updated Vital Signs Temp (!) 97.1 F (36.2 C) (Temporal)    Ht 5\' 5"  (1.651 m)    Wt 87.5 kg    BMI 32.12 kg/m   Physical Exam Vitals and nursing note reviewed.  Constitutional:      Appearance: He is well-developed.  HENT:     Head: Normocephalic and atraumatic.     Mouth/Throat:     Comments: No lip or tongue swelling noted, no oral lesions, handling secretions well, normal phonation without stridor Eyes:     Conjunctiva/sclera: Conjunctivae normal.     Pupils: Pupils are equal, round, and reactive to light.  Cardiovascular:     Rate and Rhythm: Normal rate and regular rhythm.     Heart sounds: Normal heart sounds.  Pulmonary:     Effort: Pulmonary effort is normal. No respiratory distress.     Breath sounds: Normal breath sounds. No rhonchi.  Abdominal:     General: Bowel sounds are normal.     Palpations: Abdomen is soft.     Tenderness: There is no abdominal tenderness. There is no rebound.  Musculoskeletal:        General: Normal range of motion.     Cervical back: Normal range of motion.  Skin:    General: Skin is warm and dry.     Findings: Rash present. Rash is urticarial.     Comments: Splotchy urticarial rash noted to abdomen and upper legs bilaterally, no lesions on palms/soles  Neurological:     Mental Status: He is alert and oriented to person, place, and time.     ED Results / Procedures / Treatments   Labs (all labs ordered are listed, but only abnormal results are displayed) Labs Reviewed  CBC WITH DIFFERENTIAL/PLATELET - Abnormal; Notable for the following components:      Result Value   WBC 20.8 (*)    Hemoglobin 18.2 (*)    HCT 54.4 (*)    Neutro Abs 15.5 (*)    Monocytes Absolute 1.3 (*)    All other components within normal limits  COMPREHENSIVE METABOLIC PANEL - Abnormal; Notable for the following components:   CO2 20 (*)    Glucose, Bld 286 (*)    BUN 24 (*)    Calcium 8.5 (*)    All other components within  normal limits  LIPASE, BLOOD    EKG None  Radiology No results found.  Procedures Procedures (including critical care time)  CRITICAL CARE Performed by:   Total critical care time: 35 minutes  Critical care time was exclusive of separately billable procedures and treating other patients.  Critical care was necessary to treat or prevent imminent or life-threatening deterioration.  Critical care was time spent personally by me on the following activities: development of treatment plan with patient and/or surrogate as well as nursing, discussions with consultants, evaluation of patient's response to treatment, examination of patient, obtaining history from patient or surrogate, ordering and  performing treatments and interventions, ordering and review of laboratory studies, ordering and review of radiographic studies, pulse oximetry and re-evaluation of patient's condition.   Medications Ordered in ED Medications  methylPREDNISolone sodium succinate (SOLU-MEDROL) 125 mg/2 mL injection 125 mg (125 mg Intravenous Given 10/04/19 2214)  famotidine (PEPCID) IVPB 20 mg premix (0 mg Intravenous Stopped 10/04/19 2309)  sodium chloride 0.9 % bolus 1,000 mL (0 mLs Intravenous Stopped 10/04/19 2351)  fentaNYL (SUBLIMAZE) injection 50 mcg (50 mcg Intravenous Given 10/04/19 2321)  ondansetron (ZOFRAN) injection 4 mg (4 mg Intravenous Given 10/05/19 0129)  ketorolac (TORADOL) 30 MG/ML injection 30 mg (30 mg Intravenous Given 10/05/19 0231)  metoCLOPramide (REGLAN) injection 10 mg (10 mg Intravenous Given 10/05/19 0231)  dicyclomine (BENTYL) capsule 20 mg (20 mg Oral Given 10/05/19 0230)    ED Course  I have reviewed the triage vital signs and the nursing notes.  Pertinent labs & imaging results that were available during my care of the patient were reviewed by me and considered in my medical decision making (see chart for details).    MDM Rules/Calculators/A&P  44 y.o. M here with  allergic reaction.  He ate [place] food around 1PM, then around 4PM started vomiting and having diarrhea.  Later this evening broke out in rash and tongue felt "numb".  Denies any tongue swelling or difficulty swallowing.  He is afebrile and nontoxic on arrival.  He does not have any visible lip or tongue swelling on exam, no oral lesions, handling secretions well, no stridor.  Vitals are stable.  Does have splotchy, urticarial rash to his torso and upper legs bilaterally.  No lesions on the palms or soles.  He has been given epi and Benadryl by EMS.  Will give IV Solu-Medrol, Pepcid, and fluids.  Labs pending.  Will reassess.  Labs with leukocytosis, however suspect this is reactive.  His abdomen remains soft and benign.  Itching is well controlled now, still with mild rash.  We will continue to monitor.  2:01 AM Patient remains without airway compromise.  Rash has dissipated, no longer itching.  VSS.  States his abdomen feels "tight" with cramping.  No further emesis but still feels nauseated.  States he has eaten same food before without issue.  Will give dose of reglan, toradol, bentyl, and PO trial.  4:11 AM  Patient feeling better after additional medications, was able to doze off to sleep.  Remains without vomiting/diarrhea here, has tolerated oral fluids.  Abdomen remains soft.  VSS.  Feel he is stable for discharge home.  Suspect this was food related.  Plan to discharge home with symptomatic care and close PCP follow-up.  May return here for any new/acute changes.  Final Clinical Impression(s) / ED Diagnoses Final diagnoses:  Allergic reaction, initial encounter  Nausea vomiting and diarrhea    Rx / DC Orders ED Discharge Orders    None       Garlon Hatchet, PA-C 10/05/19 0418    Virgina Norfolk, DO 10/06/19 [phone]

## 2019-10-04 NOTE — ED Triage Notes (Signed)
Pt BIB [facility] from home c/o allergic reaction.   Pt states usual states of health. No exposure to know allergies. Ate [place] for dinner about 1 pm. Symptoms stated about 4 pm NVD, abdominal pain, with tenderness on palpation RUQ /LLQ, and hives. Airway intact. No tongue swelling. No hoarseness.   EMS notes BP 100/50, pt stood up 86/40. Given 500 mL fluids. IV zofran. 0.5 epi IM and 50 mg Benadryl

## 2019-10-05 ENCOUNTER — Telehealth: Payer: Self-pay

## 2019-10-05 MED ORDER — ONDANSETRON HCL 4 MG/2ML IJ SOLN
4.0000 mg | Freq: Once | INTRAMUSCULAR | Status: AC
  Administered 2019-10-05: 4 mg via INTRAVENOUS
  Filled 2019-10-05: qty 2

## 2019-10-05 MED ORDER — KETOROLAC TROMETHAMINE 30 MG/ML IJ SOLN
30.0000 mg | Freq: Once | INTRAMUSCULAR | Status: AC
  Administered 2019-10-05: 30 mg via INTRAVENOUS
  Filled 2019-10-05: qty 1

## 2019-10-05 MED ORDER — METOCLOPRAMIDE HCL 10 MG PO TABS
10.0000 mg | ORAL_TABLET | Freq: Three times a day (TID) | ORAL | 0 refills | Status: DC | PRN
Start: 2019-10-05 — End: 2021-09-05

## 2019-10-05 MED ORDER — METOCLOPRAMIDE HCL 5 MG/ML IJ SOLN
10.0000 mg | INTRAMUSCULAR | Status: AC
  Administered 2019-10-05: 10 mg via INTRAVENOUS
  Filled 2019-10-05: qty 2

## 2019-10-05 MED ORDER — ONDANSETRON 4 MG PO TBDP
4.0000 mg | ORAL_TABLET | Freq: Three times a day (TID) | ORAL | 0 refills | Status: DC | PRN
Start: 2019-10-05 — End: 2021-09-05

## 2019-10-05 MED ORDER — DICYCLOMINE HCL 10 MG PO CAPS
20.0000 mg | ORAL_CAPSULE | Freq: Once | ORAL | Status: AC
  Administered 2019-10-05: 20 mg via ORAL
  Filled 2019-10-05: qty 2

## 2019-10-05 MED ORDER — DICYCLOMINE HCL 20 MG PO TABS
20.0000 mg | ORAL_TABLET | Freq: Two times a day (BID) | ORAL | 0 refills | Status: DC
Start: 2019-10-05 — End: 2021-09-05

## 2019-10-05 NOTE — Telephone Encounter (Signed)
[facility] Night - Client TELEPHONE ADVICE RECORD Access[employer]urse Patient Name: Tyler Hoffman Gender: Male DOB: March 09, 1976 Age: 44 Y 22 D Return Phone Number: [phone] (Primary) Address[place] [address] Client [facility] Night - Client Client Site [facility] - Night Physician Kerby Nora - MD Contact Type Call Who Is Calling Patient / Member / Family / Caregiver Call Type Triage / Clinical Caller Name Redford Behrle Relationship To Patient Spouse Return Phone Number [phone] (Primary) Chief Complaint Vomiting Reason for Call Symptomatic / Request for Health Information Initial Comment caller states her husband has a case of food poisoning and she needs advice, he is vomiting for 2 hours, he is a type 2 diabetic, BS is 256 Translation No Nurse Assessment Nurse: Vaughan Browner, RN, Marchelle Folks Date/Time (Eastern Time): 10/04/2019 7:03:58 PM Confirm and document reason for call. If symptomatic, describe symptoms. ---caller states her husband is a DM type 2 and blood sugar is 256 Has the patient had close contact with a person known or suspected to have the novel coronavirus illness OR traveled / lives in area with major community spread (including international travel) in the last 14 days from the onset of symptoms? * If Asymptomatic, screen for exposure and travel within the last 14 days. ---No Does the patient have any new or worsening symptoms? ---Yes Will a triage be completed? ---Yes Related visit to physician within the last 2 weeks? ---N/A Does the PT have any chronic conditions? (i.e. diabetes, asthma, this includes High risk factors for pregnancy, etc.) ---Yes List chronic conditions. ---DM Is this a behavioral health or substance abuse call? ---No Guidelines Guideline Title Affirmed Question Affirmed Notes Nurse Date/Time (Eastern Time) Diabetes - High Blood Sugar [1] Blood glucose  > 240 mg/dL (76.1 mmol/L) AND [9] vomiting AND [3] unable to check for ketones (in blood or urine) Humfleet, RN, Marchelle Folks 10/04/2019 7:03:57 PM PLEASE NOTE: All timestamps contained within this report are represented as [place] Standard Time. CONFIDENTIALTY NOTICE: This fax transmission is intended only for the addressee. It contains information that is legally privileged, confidential or otherwise protected from use or disclosure. If you are not the intended recipient, you are strictly prohibited from reviewing, disclosing, copying using or disseminating any of this information or taking any action in reliance on or regarding this information. If you have received this fax in error, please notify us immediately by telephone so that we can arrange for its return to [place]. Phone: [phone], Toll-Free: [phone], Fax: [phone] Page: 2 of 2 Call Id: [id number] Disp. Time Lamount Cohen Time) Disposition Final User 10/04/2019 7:10:44 PM Go to ED Now (or PCP triage) Yes Humfleet, RN, Earnestine Leys Disagree/Comply Comply Caller Understands Yes PreDisposition InappropriateToAsk Care Advice Given Per Guideline GO TO ED NOW (OR PCP TRIAGE): * IF NO PCP (PRIMARY CARE PROVIDER) SECOND-LEVEL TRIAGE: You need to be seen within the next hour. Go to the ED/UCC at _____________ Hospital. Leave as soon as you can. CARE ADVICE given per Diabetes - High Blood Sugar (Adult) guideline. ANOTHER ADULT SHOULD DRIVE: * It is better and safer if another adult drives instead of you. BRING MEDICINES: * Please bring a list of your current medicines when you go to see the doctor. * It is also a good idea to bring the pill bottles too. This will help the doctor to make certain you are taking the right medicines and the right dose. Referrals GO TO FACILITY UNDECIDED

## 2019-10-05 NOTE — Telephone Encounter (Signed)
Per chart review tab pt was seen at [facility] ED on 10/04/19.

## 2019-10-05 NOTE — Discharge Instructions (Signed)
Take the prescribed medication as directed--- you did better with reglan here so would try that for nausea/vomiting first, zofran sent for backup use if needed. Recommend lots of oral hydration and gentle diet for now, progress as tolerated. Follow-up with primary care doctor if any ongoing issues. Return to the ED for new or worsening symptoms.

## 2020-01-12 NOTE — Telephone Encounter (Signed)
Mr. Sweeden returned to [place] where he currently lives.

## 2020-01-12 NOTE — Telephone Encounter (Signed)
Do not see where pt FU after ED visit on 10/04/19.

## 2020-07-11 HISTORY — PX: CT CTA CORONARY W/CA SCORE W/CM &/OR WO/CM: HXRAD787

## 2020-07-19 ENCOUNTER — Encounter: Payer: Self-pay | Admitting: Family Medicine

## 2020-07-19 DIAGNOSIS — I2510 Atherosclerotic heart disease of native coronary artery without angina pectoris: Secondary | ICD-10-CM | POA: Insufficient documentation

## 2020-07-19 HISTORY — DX: Atherosclerotic heart disease of native coronary artery without angina pectoris: I25.10

## 2020-08-10 HISTORY — PX: TRANSTHORACIC ECHOCARDIOGRAM: SHX275

## 2020-08-31 HISTORY — PX: CORONARY STENT INTERVENTION: CATH118234

## 2020-08-31 HISTORY — PX: LEFT HEART CATH AND CORONARY ANGIOGRAPHY: CATH118249

## 2021-07-20 ENCOUNTER — Telehealth: Payer: Self-pay | Admitting: *Deleted

## 2021-07-20 ENCOUNTER — Other Ambulatory Visit: Payer: Self-pay | Admitting: Family Medicine

## 2021-07-20 DIAGNOSIS — E1159 Type 2 diabetes mellitus with other circulatory complications: Secondary | ICD-10-CM

## 2021-07-20 DIAGNOSIS — Z8249 Family history of ischemic heart disease and other diseases of the circulatory system: Secondary | ICD-10-CM

## 2021-07-20 NOTE — Telephone Encounter (Signed)
Tyler Hoffman will be returning  home from [place] this summer.  He would like to see a cardiologist while in town.  He has seen Dr. Antoine Poche many years ago.  He will be in town from June 18 thru September 16, 2021 and then the week of July 16-25, 2023.  Please place referral.   ? ?[name] if you can arrange this appointment and let me know when it is, then I can message him in [place].  ?

## 2021-07-20 NOTE — Telephone Encounter (Signed)
Referral placed.

## 2021-07-21 NOTE — Telephone Encounter (Signed)
Referral sent to Cardiology.  ? ?I added his availability as this information was not added to the comments when the referral was placed.  ? ? ?

## 2021-08-02 ENCOUNTER — Encounter: Payer: Self-pay | Admitting: Cardiology

## 2021-08-14 LAB — HM DIABETES FOOT EXAM

## 2021-08-15 ENCOUNTER — Telehealth: Payer: Self-pay | Admitting: Family Medicine

## 2021-08-15 DIAGNOSIS — Z125 Encounter for screening for malignant neoplasm of prostate: Secondary | ICD-10-CM

## 2021-08-15 DIAGNOSIS — E1159 Type 2 diabetes mellitus with other circulatory complications: Secondary | ICD-10-CM

## 2021-08-15 NOTE — Telephone Encounter (Signed)
-----   Message from Alvina Chou sent at 08/14/2021 12:27 PM EDT ----- Regarding: Lab orders for Tuesday, 6.20.23 Patient is scheduled for CPX labs, please order future labs, Thanks , Camelia Eng

## 2021-08-29 ENCOUNTER — Other Ambulatory Visit (INDEPENDENT_AMBULATORY_CARE_PROVIDER_SITE_OTHER): Payer: 59

## 2021-08-29 ENCOUNTER — Other Ambulatory Visit: Payer: Self-pay | Admitting: *Deleted

## 2021-08-29 DIAGNOSIS — E1159 Type 2 diabetes mellitus with other circulatory complications: Secondary | ICD-10-CM

## 2021-08-29 DIAGNOSIS — Z125 Encounter for screening for malignant neoplasm of prostate: Secondary | ICD-10-CM

## 2021-08-29 LAB — HEMOGLOBIN A1C: Hgb A1c MFr Bld: 7.2 % — ABNORMAL HIGH (ref 4.6–6.5)

## 2021-08-29 LAB — COMPREHENSIVE METABOLIC PANEL
ALT: 38 U/L (ref 0–53)
AST: 22 U/L (ref 0–37)
Albumin: 4.3 g/dL (ref 3.5–5.2)
Alkaline Phosphatase: 69 U/L (ref 39–117)
BUN: 16 mg/dL (ref 6–23)
CO2: 28 mEq/L (ref 19–32)
Calcium: 9 mg/dL (ref 8.4–10.5)
Chloride: 101 mEq/L (ref 96–112)
Creatinine, Ser: 0.91 mg/dL (ref 0.40–1.50)
GFR: 100.76 mL/min (ref >60.00)
Glucose, Bld: 133 mg/dL — ABNORMAL HIGH (ref 70–99)
Potassium: 4.1 mEq/L (ref 3.5–5.1)
Sodium: 136 mEq/L (ref 135–145)
Total Bilirubin: 0.6 mg/dL (ref 0.2–1.2)
Total Protein: 6.5 g/dL (ref 6.0–8.3)

## 2021-08-29 LAB — LIPID PANEL
Cholesterol: 83 mg/dL (ref 0–200)
HDL: 32.1 mg/dL — ABNORMAL LOW (ref >39.00)
LDL Cholesterol: 33 mg/dL (ref 0–99)
NonHDL: 50.63
Total CHOL/HDL Ratio: 3
Triglycerides: 90 mg/dL (ref 0.0–149.0)
VLDL: 18 mg/dL (ref 0.0–40.0)

## 2021-08-29 LAB — PSA: PSA: 0.94 ng/mL (ref 0.10–4.00)

## 2021-08-30 NOTE — Progress Notes (Signed)
No critical labs need to be addressed urgently. We will discuss labs in detail at upcoming office visit.   

## 2021-09-05 ENCOUNTER — Telehealth: Payer: Self-pay | Admitting: *Deleted

## 2021-09-05 ENCOUNTER — Encounter: Payer: Self-pay | Admitting: Family Medicine

## 2021-09-05 ENCOUNTER — Ambulatory Visit (INDEPENDENT_AMBULATORY_CARE_PROVIDER_SITE_OTHER): Payer: 59 | Admitting: Family Medicine

## 2021-09-05 VITALS — BP 122/62 | HR 79 | Temp 98.1°F | Ht 63.75 in | Wt 196.0 lb

## 2021-09-05 DIAGNOSIS — Z1211 Encounter for screening for malignant neoplasm of colon: Secondary | ICD-10-CM

## 2021-09-05 DIAGNOSIS — Z0000 Encounter for general adult medical examination without abnormal findings: Secondary | ICD-10-CM | POA: Diagnosis not present

## 2021-09-05 DIAGNOSIS — I152 Hypertension secondary to endocrine disorders: Secondary | ICD-10-CM

## 2021-09-05 DIAGNOSIS — E785 Hyperlipidemia, unspecified: Secondary | ICD-10-CM

## 2021-09-05 DIAGNOSIS — Z23 Encounter for immunization: Secondary | ICD-10-CM | POA: Diagnosis not present

## 2021-09-05 DIAGNOSIS — E1169 Type 2 diabetes mellitus with other specified complication: Secondary | ICD-10-CM

## 2021-09-05 DIAGNOSIS — E1159 Type 2 diabetes mellitus with other circulatory complications: Secondary | ICD-10-CM | POA: Diagnosis not present

## 2021-09-05 MED ORDER — ROSUVASTATIN CALCIUM 40 MG PO TABS: 40.0000 mg | ORAL_TABLET | Freq: Every day | ORAL | 0 refills | Status: DC

## 2021-09-05 MED ORDER — LISINOPRIL 20 MG PO TABS: 20.0000 mg | ORAL_TABLET | Freq: Every day | ORAL | 0 refills | Status: DC

## 2021-09-05 MED ORDER — EMPAGLIFLOZIN 10 MG PO TABS: 10.0000 mg | ORAL_TABLET | Freq: Every day | ORAL | 0 refills | Status: DC

## 2021-09-05 MED ORDER — TRULICITY 1.5 MG/0.5ML ~~LOC~~ SOAJ: 1.5000 mg | SUBCUTANEOUS | 0 refills | Status: DC

## 2021-09-05 MED ORDER — EZETIMIBE 10 MG PO TABS: 10.0000 mg | ORAL_TABLET | Freq: Every day | ORAL | 0 refills | Status: DC

## 2021-09-05 MED ORDER — TRESIBA FLEXTOUCH 100 UNIT/ML ~~LOC~~ SOPN: 60.0000 [IU] | PEN_INJECTOR | Freq: Every day | SUBCUTANEOUS | 0 refills | Status: DC

## 2021-09-05 NOTE — Progress Notes (Signed)
Patient ID: Tyler Hoffman, male    DOB: 30-Apr-1975, 46 y.o.   MRN: [id number]  This visit was conducted in person.  BP 122/62   Pulse 79   Temp 98.1 F (36.7 C) (Oral)   Ht 5' 3.75" (1.619 m)   Wt 196 lb (88.9 kg)   SpO2 98%   BMI 33.91 kg/m    CC:  Chief Complaint  Patient presents with   Annual Exam    Subjective:   HPI: Tyler Hoffman is a 46 y.o. male presenting on 09/05/2021 for Annual Exam  Currently living in  [place].  Diabetes: Borderline control on Trulicity 1.5 mg weekly, Jardiance 10 mg p.o. daily, Triseba 60 units daily  He feels control has been worse lately given issue getting Trulicity. Lab Results  Component Value Date   HGBA1C 7.2 (H) 08/29/2021  Using medications without difficulties: Hypoglycemic episodes: Hyperglycemic episodes: Feet problems: none Blood Sugars averaging: 100-130 fasting eye exam within last year: due  Hypertension:  Well controlled on bisoprolol 5 mg 1/2 tablet daily, lisinopril 20 mg p.o. daily BP Readings from Last 3 Encounters:  09/05/21 122/62  10/05/19 (!) 134/76  10/01/19 104/66  Using medication without problems or lightheadedness:  none Chest pain with exertion: none Edema: none Short of breath: none Average home BPs: Other issues:  Elevated Cholesterol: Hx of CAD and lacunar CVA, goal LDL < 70  At goal on crestor 40 mg daily and Zetia 10 mg p.o. daily Lab Results  Component Value Date   CHOL 83 08/29/2021   HDL 32.10 (L) 08/29/2021   LDLCALC 33 08/29/2021   LDLDIRECT 114.0 07/22/2015   TRIG 90.0 08/29/2021   CHOLHDL 3 08/29/2021  Using medications without problems: Muscle aches:  Diet compliance: improved Exercise: walking, stairs Other complaints:  Body mass index is 33.91 kg/m. Wt Readings from Last 3 Encounters:  09/05/21 196 lb (88.9 kg)  10/05/19 193 lb (87.5 kg)  10/01/19 193 lb 8 oz (87.8 kg)     HX of CAD (S/P  drug eluting stent 08/2020) and lacunar CVA: on plavix anticoagulation He has had  significant improvement in energy since stent placement.   Pending referral to  cardiology.... appt next week.      Relevant past medical, surgical, family and social history reviewed and updated as indicated. Interim medical history since our last visit reviewed. Allergies and medications reviewed and updated. Outpatient Medications Prior to Visit  Medication Sig Dispense Refill   aspirin 81 MG tablet Take 162 mg by mouth daily.      bisoprolol (ZEBETA) 5 MG tablet Take 2.5 mg by mouth daily.     clopidogrel (PLAVIX) 75 MG tablet Take 1 tablet by mouth daily.     Dulaglutide (TRULICITY) 1.5 MG/0.5ML SOPN Inject 1.5 mg into the skin once a week.     empagliflozin (JARDIANCE) 10 MG TABS tablet Take 1 tablet by mouth daily.     ezetimibe (ZETIA) 10 MG tablet Take 1 tablet by mouth daily.     glucose blood (ONETOUCH VERIO) test strip Use to check blood sugar 4 times a day.  Dx: E11.65. 120 each 11   insulin degludec (TRESIBA FLEXTOUCH) 100 UNIT/ML FlexTouch Pen Inject 60 Units into the skin daily.     Insulin Pen Needle (PEN NEEDLES) 30G X 8 MM MISC Inject 10 Units into the skin daily. Pen needles for Levemir Flex Pen 100 each 11   lisinopril (ZESTRIL) 10 MG tablet Take 10 mg by mouth daily.  rosuvastatin (CRESTOR) 40 MG tablet Take 40 mg by mouth daily.     tadalafil (CIALIS) 5 MG tablet      dicyclomine (BENTYL) 20 MG tablet Take 1 tablet (20 mg total) by mouth 2 (two) times daily. 20 tablet 0   hydrochlorothiazide (MICROZIDE) 12.5 MG capsule Take 12.5 mg by mouth daily.     insulin detemir (LEVEMIR) 100 UNIT/ML injection Inject 60 Units into the skin daily.     liraglutide (VICTOZA) 18 MG/3ML SOPN Inject 0.3 mLs (1.8 mg total) into the skin daily. (Patient not taking: Reported on 10/01/2019) 6 mL 2   metoCLOPramide (REGLAN) 10 MG tablet Take 1 tablet (10 mg total) by mouth 3 (three) times daily as needed for nausea. 20 tablet 0   ondansetron (ZOFRAN ODT) 4 MG disintegrating tablet Take 1  tablet (4 mg total) by mouth every 8 (eight) hours as needed for nausea. 10 tablet 0   No facility-administered medications prior to visit.     Per HPI unless specifically indicated in ROS section below Review of Systems  Constitutional:  Negative for fatigue and fever.  HENT:  Negative for ear pain.   Eyes:  Negative for pain.  Respiratory:  Negative for cough and shortness of breath.   Cardiovascular:  Negative for chest pain, palpitations and leg swelling.  Gastrointestinal:  Negative for abdominal pain.  Genitourinary:  Negative for dysuria.  Musculoskeletal:  Negative for arthralgias.  Neurological:  Negative for syncope, light-headedness and headaches.  Psychiatric/Behavioral:  Negative for dysphoric mood.    Objective:  BP 122/62   Pulse 79   Temp 98.1 F (36.7 C) (Oral)   Ht 5' 3.75" (1.619 m)   Wt 196 lb (88.9 kg)   SpO2 98%   BMI 33.91 kg/m   Wt Readings from Last 3 Encounters:  09/05/21 196 lb (88.9 kg)  10/05/19 193 lb (87.5 kg)  10/01/19 193 lb 8 oz (87.8 kg)      Physical Exam Constitutional:      General: He is not in acute distress.    Appearance: Normal appearance. He is well-developed. He is not ill-appearing or toxic-appearing.  HENT:     Head: Normocephalic and atraumatic.     Right Ear: Hearing, tympanic membrane, ear canal and external ear normal.     Left Ear: Hearing, tympanic membrane, ear canal and external ear normal.     Nose: Nose normal.     Mouth/Throat:     Pharynx: Uvula midline.  Eyes:     General: Lids are normal. Lids are everted, no foreign bodies appreciated.     Conjunctiva/sclera: Conjunctivae normal.     Pupils: Pupils are equal, round, and reactive to light.  Neck:     Thyroid: No thyroid mass or thyromegaly.     Vascular: No carotid bruit.     Trachea: Trachea and phonation normal.  Cardiovascular:     Rate and Rhythm: Normal rate and regular rhythm.     Pulses: Normal pulses.     Heart sounds: S1 normal and S2 normal.  No murmur heard.    No gallop.  Pulmonary:     Breath sounds: Normal breath sounds. No wheezing, rhonchi or rales.  Abdominal:     General: Bowel sounds are normal.     Palpations: Abdomen is soft.     Tenderness: There is no abdominal tenderness. There is no guarding or rebound.     Hernia: No hernia is present.  Musculoskeletal:     Cervical back:  Normal range of motion and neck supple.  Lymphadenopathy:     Cervical: No cervical adenopathy.  Skin:    General: Skin is warm and dry.     Findings: No rash.  Neurological:     Mental Status: He is alert.     Cranial Nerves: No cranial nerve deficit.     Sensory: No sensory deficit.     Gait: Gait normal.     Deep Tendon Reflexes: Reflexes are normal and symmetric.  Psychiatric:        Speech: Speech normal.        Behavior: Behavior normal.        Judgment: Judgment normal.     Diabetic foot exam: Normal inspection No skin breakdown No calluses  Normal DP pulses Normal sensation to light touch and monofilament Nails normal     Results for orders placed or performed in visit on 08/29/21  PSA  Result Value Ref Range   PSA 0.94 0.10 - 4.00 ng/mL  Comprehensive metabolic panel  Result Value Ref Range   Sodium 136 135 - 145 mEq/L   Potassium 4.1 3.5 - 5.1 mEq/L   Chloride 101 96 - 112 mEq/L   CO2 28 19 - 32 mEq/L   Glucose, Bld 133 (H) 70 - 99 mg/dL   BUN 16 6 - 23 mg/dL   Creatinine, Ser 2.70 0.40 - 1.50 mg/dL   Total Bilirubin 0.6 0.2 - 1.2 mg/dL   Alkaline Phosphatase 69 39 - 117 U/L   AST 22 0 - 37 U/L   ALT 38 0 - 53 U/L   Total Protein 6.5 6.0 - 8.3 g/dL   Albumin 4.3 3.5 - 5.2 g/dL   GFR 623.76 >28.31 mL/min   Calcium 9.0 8.4 - 10.5 mg/dL  Lipid panel  Result Value Ref Range   Cholesterol 83 0 - 200 mg/dL   Triglycerides 51.7 0.0 - 149.0 mg/dL   HDL 61.60 (L) >73.71 mg/dL   VLDL 06.2 0.0 - 69.4 mg/dL   LDL Cholesterol 33 0 - 99 mg/dL   Total CHOL/HDL Ratio 3    NonHDL 50.63   Hemoglobin A1c  Result  Value Ref Range   Hgb A1c MFr Bld 7.2 (H) 4.6 - 6.5 %     COVID 19 screen:  No recent travel or known exposure to COVID19 The patient denies respiratory symptoms of COVID 19 at this time. The importance of social distancing was discussed today.   Assessment and Plan The patient's preventative maintenance and recommended screening tests for an annual wellness exam were reviewed in full today. Brought up to date unless services declined.  Counselled on the importance of diet, exercise, and its role in overall health and mortality. The patient's FH and SH was reviewed, including their home life, tobacco status, and drug and alcohol status.   Vaccines: uptodate, S/P COVID19 vaccine No early family history of prostate or colon cancer. Smoking Status: none ETOH/ drug use: rare/ none HIV screen:   refused Colon cancer screening: will   Problem List Items Addressed This Visit     Diabetes mellitus with circulatory complication, with long-term current use of insulin (HCC) (Chronic)    Chronic, slightly worsening control given difficulties getting Trulicity in [place]. Continue Trulicity 1.5 mg weekly, Jardiance 10 mg p.o. daily, Triseba 60 units daily       Relevant Medications   bisoprolol (ZEBETA) 5 MG tablet   tadalafil (CIALIS) 5 MG tablet   Dulaglutide (TRULICITY) 1.5 MG/0.5ML SOPN  empagliflozin (JARDIANCE) 10 MG TABS tablet   ezetimibe (ZETIA) 10 MG tablet   rosuvastatin (CRESTOR) 40 MG tablet   lisinopril (ZESTRIL) 20 MG tablet   Hyperlipidemia associated with type 2 diabetes mellitus (HCC) (Chronic)    Stable, chronic.  Continue current medication.   Hx of CAD and lacunar CVA, goal LDL < 70  At goal on crestor 40 mg daily and Zetia 10 mg p.o. daily      Relevant Medications   bisoprolol (ZEBETA) 5 MG tablet   tadalafil (CIALIS) 5 MG tablet   Dulaglutide (TRULICITY) 1.5 MG/0.5ML SOPN   empagliflozin (JARDIANCE) 10 MG TABS tablet   ezetimibe (ZETIA) 10 MG tablet    rosuvastatin (CRESTOR) 40 MG tablet   lisinopril (ZESTRIL) 20 MG tablet   Hypertension associated with diabetes (HCC) (Chronic)    Chronic, well controlled on bisoprolol 5 mg 1/2 tablet daily, lisinopril 20 mg p.o. daily      Relevant Medications   bisoprolol (ZEBETA) 5 MG tablet   tadalafil (CIALIS) 5 MG tablet   Dulaglutide (TRULICITY) 1.5 MG/0.5ML SOPN   empagliflozin (JARDIANCE) 10 MG TABS tablet   ezetimibe (ZETIA) 10 MG tablet   rosuvastatin (CRESTOR) 40 MG tablet   lisinopril (ZESTRIL) 20 MG tablet   Other Visit Diagnoses     Routine general medical examination at a health care facility    -  Primary   Relevant Orders   Pneumococcal conjugate vaccine 20-valent (Prevnar 20) (Completed)   Td : Tetanus/diphtheria >7yo Preservative  free (Completed)   Colon cancer screening       Relevant Orders   Fecal occult blood, imunochemical   Need for Td vaccine       Relevant Orders   Td : Tetanus/diphtheria >7yo Preservative  free (Completed)      Meds ordered this encounter  Medications   Dulaglutide (TRULICITY) 1.5 MG/0.5ML SOPN    Sig: Inject 1.5 mg into the skin once a week.    Dispense:  6 mL    Refill:  0   empagliflozin (JARDIANCE) 10 MG TABS tablet    Sig: Take 1 tablet (10 mg total) by mouth daily.    Dispense:  90 tablet    Refill:  0   ezetimibe (ZETIA) 10 MG tablet    Sig: Take 1 tablet (10 mg total) by mouth daily.    Dispense:  90 tablet    Refill:  0   DISCONTD: insulin degludec (TRESIBA FLEXTOUCH) 100 UNIT/ML FlexTouch Pen    Sig: Inject 60 Units into the skin daily.    Dispense:  18 mL    Refill:  0   rosuvastatin (CRESTOR) 40 MG tablet    Sig: Take 1 tablet (40 mg total) by mouth daily.    Dispense:  90 tablet    Refill:  0   lisinopril (ZESTRIL) 20 MG tablet    Sig: Take 1 tablet (20 mg total) by mouth daily.    Dispense:  90 tablet    Refill:  0   Orders Placed This Encounter  Procedures   Fecal occult blood, imunochemical    Standing Status:    Future    Standing Expiration Date:   09/05/2022   Pneumococcal conjugate vaccine 20-valent (Prevnar 20)   Td : Tetanus/diphtheria >7yo Preservative  free   HM DIABETES FOOT EXAM    This external order was created through the Results Console.    Kerby Nora, MD

## 2021-09-10 ENCOUNTER — Encounter: Payer: Self-pay | Admitting: Cardiology

## 2021-09-10 NOTE — Progress Notes (Unsigned)
Primary Care Provider: Excell Seltzer, MD [facility] Cardiologist: None Electrophysiologist: None  Clinic Note: No chief complaint on file.   ===================================  ASSESSMENT/PLAN   Problem List Items Addressed This Visit       Cardiology Problems   CAD S/P DES PCI-LAD (08/2020 - in [place]) (Chronic)   Hyperlipidemia associated with type 2 diabetes mellitus (HCC) (Chronic)   Essential hypertension - Primary (Chronic)     Other   Obesity (BMI 30-39.9) (Chronic)   OSA (obstructive sleep apnea) (Chronic)   ===================================  HPI:    Tyler Hoffman is a 46 y.o. male with a history of lacunar CVA, and Single-Vessel CAD with DES PCI to the LAD in June 2022 (annually) who is being seen today for the evaluation of CORONARY ARTERY DISEASE-PCI at the request of Excell Seltzer, MD.  [name] was seen by Dr. Antoine Poche on December 05, 2010 for a 5-year follow-up after being seen in 2007 for evaluation of chest pain.  He had no evidence of CAD in the past evaluation, but due to family history of CAD.  This visit was for dyspnea that is increasing over the last month.  Mostly dyspnea with exertion such as mowing [place] lawn and [place].  Mildly decreased exercise tolerance.  No chest discomfort neck or back discomfort.  No PND orthopnea edema. FLP ordered suggesting LDL less than 70 and HDL greater than 50.   ETT ordered-not performed. => Coronary Calcium Score November 2012: 0  Recent Hospitalizations: None  He was seen and evaluated by cardiologist in [place] in April-May 2022.  Apparently underwent functional study that was positive and therefore underwent coronary CTA reviewed below that revealed possible occlusion of the LAD.  Apparently he did undergo DES PCI in June 2022)-he was started on aspirin Plavix.  Noted significant improvement in symptoms since PCI.  He has returned back to his home base in [place] and been evaluated by  Dr. Pattricia Boss on September 05, 2021, and been referred for cardiology evaluation here.  Per her note:  Borderline diabetes control (A1c 7.2) on Trulicity 1.5 mg weekly, Jardiance 10 mg daily and Tresiba 60 units daily.  Has had issues with getting Trulicity.   Blood pressure is well controlled on bisoprolol 2.5 mg daily and lisinopril 20 mg daily.  Denied any chest pain with exertion; no edema, normal BPs at home and shortness of breath.   Noted exercise includes walking, and using stairs.   Reviewed  CV studies:    The following studies were reviewed today: (if available, images/films reviewed: From Epic Chart or Care Everywhere) TTE with Bubble Study 06/07/2016: Normal LV function.  EF 60 to 65%.  No RWMA.  GR 1 DD.  No IAS, PFO or ASD noted.  No right to left shunt.  Normal valves.  Normal study Carotid Dopplers 05/24/2016: Normal carotid duplex negative for any hemodynamically significant stenosis involving extracranial carotids. Coronary CTA 07/11/2020 (performed in [place]): In response to abnormal functional study. Coronary Calcium Score 0.3.  LAD and circumflex have separate ostia.  Appears to be occlusion of the LAD after SP1.  LCx gives off 1 major marginal branch with no significant disease.  RCA has diffuse atheroma with moderate stenosis prior to the crux.  LVEF normal systolic function. ->  Coronary angiography recommended.    Interval History:   [name]   CV Review of Symptoms (Summary) Cardiovascular ROS: {roscv:310661}  REVIEWED OF SYSTEMS   ROS  I have reviewed  and (if needed) personally updated the patient's problem list, medications, allergies, past medical and surgical history, social and family history.   PAST MEDICAL HISTORY   Past Medical History:  Diagnosis Date   CAD S/P DES PCI-LAD (08/2020 - in [place]) 07/19/2020   CTA 07/11/2020 (performed in [place]): In response to abnormal functional study. Coronary Calcium Score 0.3.  LAD and circumflex have separate ostia.   Appears to be occlusion of the LAD after SP1.  LCx gives off 1 major marginal branch with no significant disease.  RCA has diffuse atheroma with moderate stenosis prior to the crux.  LVEF normal systolic function. ->  Coronary angiography recommended DE   Essential hypertension 05/22/2016   History of rosacea    Hyperlipidemia associated with type 2 diabetes mellitus (HCC) 08/22/2006   Qualifier: Diagnosis of  By: Jillyn Hidden FNP, Mcarthur Rossetti    Obesity (BMI 30-39.9) 10/16/2013   OSA (obstructive sleep apnea)    Uses CPAP   Type II diabetes mellitus with complication (HCC)    [place] CVA &  CAD- PCI LAD    PAST SURGICAL HISTORY   Past Surgical History:  Procedure Laterality Date   CT CTA CORONARY W/CA SCORE W/CM &/OR WO/CM  07/11/2020   Performed in [place] in response to abnormal functional study: o Coronary Calcium Score 0.3.  LAD and circumflex have separate ostia.  Appears to be occlusion of the LAD after SP1.  LCx gives off 1 major marginal branch with no significant disease.  RCA has diffuse atheroma with moderate stenosis prior to the crux.  LVEF normal systolic function. ->  Coronary angiography recommended.   nasoplasty  08/11/2003    Immunization History  Administered Date(s) Administered   Influenza Whole 02/16/2009   Influenza, Seasonal, Injecte, Preservative Fre 01/07/2015   Moderna Sars-Covid-2 Vaccination 04/10/2019, 05/08/2019   PNEUMOCOCCAL CONJUGATE-20 09/05/2021   PPD Test 08/17/2016   Pneumococcal Polysaccharide-23 06/15/2016   Td 10/10/1997, 11/03/2009, 09/05/2021    MEDICATIONS/ALLERGIES   No outpatient medications have been marked as taking for the 09/11/21 encounter (Appointment) with Marykay Lex, MD.    Allergies  Allergen Reactions   Metformin Diarrhea   Metformin And Related Diarrhea   Pseudoephedrine Hypertension   Sulfa Antibiotics Diarrhea   Septra [Sulfamethoxazole-Trimethoprim] Diarrhea, Swelling and Rash   Sulfamethoxazole-Trimethoprim  Swelling and Rash    Red all over, also   Sulfonamide Derivatives Swelling and Rash    SOCIAL HISTORY/FAMILY HISTORY   Reviewed in Epic:   Social History   Tobacco Use   Smoking status: Never   Smokeless tobacco: Never  Substance Use Topics   Alcohol use: Yes    Comment: Very rare   Drug use: No   Social History   Social History Narrative   Wife: Education officer, museum. Elementary.    Open style.   Performing arts.    2 children       Daily Caffeine Use 2-3 cups per day   Family History  Problem Relation Age of Onset   Heart disease Mother 45   Heart disease Paternal Grandfather    Diabetes Maternal Grandfather    Heart disease Paternal Grandmother    He currently lives in [place].  OBJCTIVE -PE, EKG, labs   Wt Readings from Last 3 Encounters:  09/05/21 196 lb (88.9 kg)  10/05/19 193 lb (87.5 kg)  10/01/19 193 lb 8 oz (87.8 kg)    Physical Exam: There were no vitals taken for this visit. Physical Exam   Adult  ECG Report  Rate: *** ;  Rhythm: {rhythm:17366};   Narrative Interpretation: ***  Recent Labs:  ***  Lab Results  Component Value Date   CHOL 83 08/29/2021   HDL 32.10 (L) 08/29/2021   LDLCALC 33 08/29/2021   LDLDIRECT 114.0 07/22/2015   TRIG 90.0 08/29/2021   CHOLHDL 3 08/29/2021   Lab Results  Component Value Date   CREATININE 0.91 08/29/2021   BUN 16 08/29/2021   NA 136 08/29/2021   K 4.1 08/29/2021   CL 101 08/29/2021   CO2 28 08/29/2021      Latest Ref Rng & Units 10/04/2019   10:07 PM 04/26/2016    3:14 PM 04/26/2016    2:55 PM  CBC  WBC 4.0 - 10.5 K/uL 20.8   8.9   Hemoglobin 13.0 - 17.0 g/dL 57.0  17.7  93.9   Hematocrit 39.0 - 52.0 % 54.4  45.0  44.1   Platelets 150 - 400 K/uL 321   251     Lab Results  Component Value Date   HGBA1C 7.2 (H) 08/29/2021   Lab Results  Component Value Date   TSH 1.88 04/06/2016    ==================================================  COVID-19 Education: The signs and symptoms of  COVID-19 were discussed with the patient and how to seek care for testing (follow up with PCP or arrange E-visit).    I spent a total of *** minutes with the patient spent in direct patient consultation.  Additional time spent with chart review  / charting (studies, outside notes, etc): *** min Total Time: *** min  Current medicines are reviewed at length with the patient today.  (+/- concerns) ***  This visit occurred during the SARS-CoV-2 public health emergency.  Safety protocols were in place, including screening questions prior to the visit, additional usage of staff PPE, and extensive cleaning of exam room while observing appropriate contact time as indicated for disinfecting solutions.  Notice: This dictation was prepared with Dragon dictation along with smart phrase technology. Any transcriptional errors that result from this process are unintentional and may not be corrected upon review.   Studies Ordered:  No orders of the defined types were placed in this encounter.  No orders of the defined types were placed in this encounter.   Patient Instructions / Medication Changes & Studies & Tests Ordered   There are no Patient Instructions on file for this visit.    Bryan Lemma, M.D., M.S. Interventional Cardiologist   Pager # [phone] Phone # [phone] [address] [place], [place] [address]   Thank you for choosing Heartcare at [facility]!!

## 2021-09-11 ENCOUNTER — Ambulatory Visit (INDEPENDENT_AMBULATORY_CARE_PROVIDER_SITE_OTHER): Payer: 59 | Admitting: Cardiology

## 2021-09-11 ENCOUNTER — Encounter: Payer: Self-pay | Admitting: Cardiology

## 2021-09-11 VITALS — BP 116/82 | HR 85 | Ht 63.0 in | Wt 198.2 lb

## 2021-09-11 DIAGNOSIS — I2510 Atherosclerotic heart disease of native coronary artery without angina pectoris: Secondary | ICD-10-CM

## 2021-09-11 DIAGNOSIS — E1169 Type 2 diabetes mellitus with other specified complication: Secondary | ICD-10-CM

## 2021-09-11 DIAGNOSIS — E1159 Type 2 diabetes mellitus with other circulatory complications: Secondary | ICD-10-CM

## 2021-09-11 DIAGNOSIS — E669 Obesity, unspecified: Secondary | ICD-10-CM

## 2021-09-11 DIAGNOSIS — E785 Hyperlipidemia, unspecified: Secondary | ICD-10-CM

## 2021-09-11 DIAGNOSIS — Z794 Long term (current) use of insulin: Secondary | ICD-10-CM

## 2021-09-11 DIAGNOSIS — I10 Essential (primary) hypertension: Secondary | ICD-10-CM | POA: Diagnosis not present

## 2021-09-11 DIAGNOSIS — Z9861 Coronary angioplasty status: Secondary | ICD-10-CM

## 2021-09-11 DIAGNOSIS — G4733 Obstructive sleep apnea (adult) (pediatric): Secondary | ICD-10-CM

## 2021-09-11 NOTE — Patient Instructions (Addendum)
Medication Instructions:  Stop Aspirin now     Generic Medication in question if you are unable to get Truilicity  Mounjaro-- Tirzepatide Ozempic -- Semaglutide   *If you need a refill on your cardiac medications before your next appointment, please call your pharmacy*   Lab Work:  Not needed   Testing/Procedures:  Not needed  Follow-Up: At [facility], you and your health needs are our priority.  As part of our continuing mission to provide you with exceptional heart care, we have created designated Provider Care Teams.  These Care Teams include your primary Cardiologist (physician) and Advanced Practice Providers (APPs -  Physician Assistants and Nurse Practitioners) who all work together to provide you with the care you need, when you need it.     Your next appointment:   12 month(s)  The format for your next appointment:   Virtual Visit   Provider:   Bryan Lemma, MD    Other Instructions

## 2021-09-13 ENCOUNTER — Encounter: Payer: Self-pay | Admitting: Cardiology

## 2021-09-13 NOTE — Assessment & Plan Note (Signed)
Two-vessel CAD noted on cardiac cath with severe lesion in the LAD treated with DES stent.  Moderate disease in the RCA evaluated with FFR that was nonocclusive. Continue aggressive risk modification.  Plan:  On stable dose of beta-blocker, ACE inhibitor as well as statin plus Zetia.  Continue to titrate diabetes management.  Consider switching from Trulicity to either Ozempic or Mounjaro for better glycemic control, and weight loss benefit.  He remains on DAPT with aspirin and Plavix, was not sure what to do next.    My recommendation be to stop aspirin and continue Plavix for another year.  He is 1 year out, would like to get 2 years coverage based on the location of the stent.    Okay to interrupt at this point.

## 2021-09-13 NOTE — Assessment & Plan Note (Signed)
Lipids from June of this year looked outstanding.  LDL 33 on rosuvastatin 40 mg and Zetia 10 mg.  He is on [place] along with Levemir insulin and Tresiba. He had issues with getting Trulicity [place] and was also concerned about the [place].  I provided him the tradename's for other medications in each category: Jardiance otherwise known as a empagliflozin can be substituted for Marcelline Deist which is dapagliflozin. Trulicity is a GLP-1 agonist, and perhaps more effective medication in this category would be Mounjaro (Tirzepatide) or Ozempic (semaglutide).  If he has trouble with these medications overseas, will be happy to refill a different version.

## 2021-09-13 NOTE — Assessment & Plan Note (Deleted)
Blood pressure is well controlled on current regimen.  He is on bisoprolol and lisinopril stable doses.  [place] refills provided.

## 2021-09-13 NOTE — Assessment & Plan Note (Signed)
The patient understands the need to lose weight with diet and exercise. We have discussed specific strategies for this.  

## 2021-09-13 NOTE — Assessment & Plan Note (Signed)
Continue encourage CPAP.

## 2021-09-13 NOTE — Assessment & Plan Note (Signed)
Blood pressure is well controlled on current regimen.  He is on bisoprolol and lisinopril stable doses.  US refills provided. 

## 2021-09-13 NOTE — Assessment & Plan Note (Signed)
It was increased to 3 mg of Trulicity before but is now back to 1.5. He is also on Tresiba, Jardiance and Levemir insulin.  Trying to adjust his diet.  A1c went the wrong way when he ran out of Trulicity.  Hopefully can get back on Trulicity, his A1c will be turned back around.

## 2021-09-26 ENCOUNTER — Other Ambulatory Visit: Payer: Self-pay | Admitting: Family Medicine

## 2021-10-10 NOTE — Assessment & Plan Note (Signed)
Chronic, well controlled on bisoprolol 5 mg 1/2 tablet daily, lisinopril 20 mg p.o. daily

## 2021-10-10 NOTE — Assessment & Plan Note (Signed)
Stable, chronic.  Continue current medication.   Hx of CAD and lacunar CVA, goal LDL < 70  At goal on crestor 40 mg daily and Zetia 10 mg p.o. daily

## 2021-10-10 NOTE — Assessment & Plan Note (Signed)
Chronic, slightly worsening control given difficulties getting Trulicity in [place]. Continue Trulicity 1.5 mg weekly, Jardiance 10 mg p.o. daily, Triseba 60 units daily

## 2021-12-19 ENCOUNTER — Other Ambulatory Visit: Payer: Self-pay | Admitting: *Deleted

## 2021-12-19 MED ORDER — EMPAGLIFLOZIN 10 MG PO TABS: 10.0000 mg | ORAL_TABLET | Freq: Every day | ORAL | 3 refills | Status: DC

## 2023-07-03 ENCOUNTER — Encounter: Payer: Self-pay | Admitting: Cardiology

## 2023-07-27 ENCOUNTER — Encounter: Payer: Self-pay | Admitting: Family Medicine

## 2023-07-29 ENCOUNTER — Other Ambulatory Visit: Payer: Self-pay | Admitting: Family Medicine

## 2023-07-29 DIAGNOSIS — E1159 Type 2 diabetes mellitus with other circulatory complications: Secondary | ICD-10-CM

## 2023-07-29 DIAGNOSIS — Z1211 Encounter for screening for malignant neoplasm of colon: Secondary | ICD-10-CM

## 2023-07-29 DIAGNOSIS — N529 Male erectile dysfunction, unspecified: Secondary | ICD-10-CM

## 2023-07-29 DIAGNOSIS — Z125 Encounter for screening for malignant neoplasm of prostate: Secondary | ICD-10-CM

## 2023-07-29 NOTE — Addendum Note (Signed)
 Addended by: Wyn Heater on: 07/29/2023 09:00 AM   Modules accepted: Orders

## 2023-08-30 ENCOUNTER — Other Ambulatory Visit (INDEPENDENT_AMBULATORY_CARE_PROVIDER_SITE_OTHER)

## 2023-08-30 DIAGNOSIS — Z125 Encounter for screening for malignant neoplasm of prostate: Secondary | ICD-10-CM | POA: Diagnosis not present

## 2023-08-30 DIAGNOSIS — E1159 Type 2 diabetes mellitus with other circulatory complications: Secondary | ICD-10-CM | POA: Diagnosis not present

## 2023-08-30 DIAGNOSIS — N529 Male erectile dysfunction, unspecified: Secondary | ICD-10-CM | POA: Diagnosis not present

## 2023-08-30 LAB — COMPREHENSIVE METABOLIC PANEL WITH GFR
ALT: 34 U/L (ref 0–53)
AST: 24 U/L (ref 0–37)
Albumin: 4.4 g/dL (ref 3.5–5.2)
Alkaline Phosphatase: 64 U/L (ref 39–117)
BUN: 13 mg/dL (ref 6–23)
CO2: 30 meq/L (ref 19–32)
Calcium: 8.9 mg/dL (ref 8.4–10.5)
Chloride: 107 meq/L (ref 96–112)
Creatinine, Ser: 0.82 mg/dL (ref 0.40–1.50)
GFR: 103.55 mL/min (ref >60.00)
Glucose, Bld: 109 mg/dL — ABNORMAL HIGH (ref 70–99)
Potassium: 4.4 meq/L (ref 3.5–5.1)
Sodium: 143 meq/L (ref 135–145)
Total Bilirubin: 0.6 mg/dL (ref 0.2–1.2)
Total Protein: 6.4 g/dL (ref 6.0–8.3)

## 2023-08-30 LAB — LIPID PANEL
Cholesterol: 124 mg/dL (ref 0–200)
HDL: 37.8 mg/dL — ABNORMAL LOW (ref >39.00)
LDL Cholesterol: 68 mg/dL (ref 0–99)
NonHDL: 85.9
Total CHOL/HDL Ratio: 3
Triglycerides: 88 mg/dL (ref 0.0–149.0)
VLDL: 17.6 mg/dL (ref 0.0–40.0)

## 2023-08-30 LAB — HEMOGLOBIN A1C: Hgb A1c MFr Bld: 8.1 % — ABNORMAL HIGH (ref 4.6–6.5)

## 2023-08-30 LAB — MICROALBUMIN / CREATININE URINE RATIO
Creatinine,U: 70.3 mg/dL
Microalb Creat Ratio: UNDETERMINED mg/g (ref 0.0–30.0)
Microalb, Ur: <0.7 mg/dL

## 2023-08-30 LAB — PSA: PSA: 1.09 ng/mL (ref 0.10–4.00)

## 2023-09-03 ENCOUNTER — Ambulatory Visit: Payer: Self-pay | Admitting: Family Medicine

## 2023-09-03 NOTE — Progress Notes (Signed)
 No critical labs need to be addressed urgently. We will discuss labs in detail at upcoming office visit.

## 2023-09-04 LAB — TESTOS,TOTAL,FREE AND SHBG (FEMALE)
Free Testosterone: 55.9 pg/mL (ref 35.0–155.0)
Sex Hormone Binding: 14 nmol/L (ref 10–50)
Testosterone, Total, LC-MS-MS: 245 ng/dL — ABNORMAL LOW (ref 250–1100)

## 2023-09-08 ENCOUNTER — Other Ambulatory Visit: Payer: Self-pay

## 2023-09-08 ENCOUNTER — Encounter: Payer: Self-pay | Admitting: Cardiology

## 2023-09-08 ENCOUNTER — Inpatient Hospital Stay (HOSPITAL_COMMUNITY)

## 2023-09-08 ENCOUNTER — Inpatient Hospital Stay (HOSPITAL_COMMUNITY)
Admission: EM | Admit: 2023-09-08 | Discharge: 2023-09-11 | DRG: 062 | Disposition: A | Discharge status: Rehabilitation Facility | Attending: Neurology | Admitting: Neurology

## 2023-09-08 ENCOUNTER — Encounter (HOSPITAL_COMMUNITY): Payer: Self-pay | Admitting: Emergency Medicine

## 2023-09-08 ENCOUNTER — Emergency Department (HOSPITAL_COMMUNITY)

## 2023-09-08 DIAGNOSIS — Z794 Long term (current) use of insulin: Secondary | ICD-10-CM | POA: Diagnosis not present

## 2023-09-08 DIAGNOSIS — I6389 Other cerebral infarction: Secondary | ICD-10-CM | POA: Diagnosis not present

## 2023-09-08 DIAGNOSIS — I639 Cerebral infarction, unspecified: Principal | ICD-10-CM | POA: Diagnosis present

## 2023-09-08 DIAGNOSIS — Z6835 Body mass index (BMI) 35.0-35.9, adult: Secondary | ICD-10-CM | POA: Diagnosis not present

## 2023-09-08 DIAGNOSIS — E669 Obesity, unspecified: Secondary | ICD-10-CM | POA: Diagnosis not present

## 2023-09-08 DIAGNOSIS — R29810 Facial weakness: Secondary | ICD-10-CM | POA: Diagnosis present

## 2023-09-08 DIAGNOSIS — E785 Hyperlipidemia, unspecified: Secondary | ICD-10-CM | POA: Diagnosis present

## 2023-09-08 DIAGNOSIS — E1165 Type 2 diabetes mellitus with hyperglycemia: Secondary | ICD-10-CM | POA: Diagnosis present

## 2023-09-08 DIAGNOSIS — Z833 Family history of diabetes mellitus: Secondary | ICD-10-CM

## 2023-09-08 DIAGNOSIS — G4733 Obstructive sleep apnea (adult) (pediatric): Secondary | ICD-10-CM | POA: Diagnosis present

## 2023-09-08 DIAGNOSIS — I6381 Other cerebral infarction due to occlusion or stenosis of small artery: Secondary | ICD-10-CM | POA: Diagnosis not present

## 2023-09-08 DIAGNOSIS — R001 Bradycardia, unspecified: Secondary | ICD-10-CM | POA: Diagnosis not present

## 2023-09-08 DIAGNOSIS — Z8249 Family history of ischemic heart disease and other diseases of the circulatory system: Secondary | ICD-10-CM

## 2023-09-08 DIAGNOSIS — Z6833 Body mass index (BMI) 33.0-33.9, adult: Secondary | ICD-10-CM | POA: Diagnosis not present

## 2023-09-08 DIAGNOSIS — Z881 Allergy status to other antibiotic agents status: Secondary | ICD-10-CM | POA: Diagnosis not present

## 2023-09-08 DIAGNOSIS — I2510 Atherosclerotic heart disease of native coronary artery without angina pectoris: Secondary | ICD-10-CM | POA: Diagnosis present

## 2023-09-08 DIAGNOSIS — R29700 NIHSS score 0: Secondary | ICD-10-CM | POA: Diagnosis not present

## 2023-09-08 DIAGNOSIS — I6329 Cerebral infarction due to unspecified occlusion or stenosis of other precerebral arteries: Principal | ICD-10-CM | POA: Diagnosis present

## 2023-09-08 DIAGNOSIS — Z955 Presence of coronary angioplasty implant and graft: Secondary | ICD-10-CM | POA: Diagnosis not present

## 2023-09-08 DIAGNOSIS — I25810 Atherosclerosis of coronary artery bypass graft(s) without angina pectoris: Secondary | ICD-10-CM | POA: Diagnosis not present

## 2023-09-08 DIAGNOSIS — R17 Unspecified jaundice: Secondary | ICD-10-CM | POA: Diagnosis not present

## 2023-09-08 DIAGNOSIS — I69351 Hemiplegia and hemiparesis following cerebral infarction affecting right dominant side: Secondary | ICD-10-CM | POA: Diagnosis not present

## 2023-09-08 DIAGNOSIS — E1169 Type 2 diabetes mellitus with other specified complication: Secondary | ICD-10-CM | POA: Diagnosis present

## 2023-09-08 DIAGNOSIS — E66811 Obesity, class 1: Secondary | ICD-10-CM | POA: Diagnosis present

## 2023-09-08 DIAGNOSIS — I10 Essential (primary) hypertension: Secondary | ICD-10-CM | POA: Diagnosis not present

## 2023-09-08 DIAGNOSIS — Z79899 Other long term (current) drug therapy: Secondary | ICD-10-CM

## 2023-09-08 DIAGNOSIS — R29702 NIHSS score 2: Secondary | ICD-10-CM | POA: Diagnosis present

## 2023-09-08 DIAGNOSIS — Z888 Allergy status to other drugs, medicaments and biological substances status: Secondary | ICD-10-CM | POA: Diagnosis not present

## 2023-09-08 DIAGNOSIS — Z7982 Long term (current) use of aspirin: Secondary | ICD-10-CM

## 2023-09-08 DIAGNOSIS — I495 Sick sinus syndrome: Secondary | ICD-10-CM | POA: Diagnosis not present

## 2023-09-08 DIAGNOSIS — Z7902 Long term (current) use of antithrombotics/antiplatelets: Secondary | ICD-10-CM | POA: Diagnosis not present

## 2023-09-08 DIAGNOSIS — Z7985 Long-term (current) use of injectable non-insulin antidiabetic drugs: Secondary | ICD-10-CM | POA: Diagnosis not present

## 2023-09-08 DIAGNOSIS — G4730 Sleep apnea, unspecified: Secondary | ICD-10-CM | POA: Diagnosis not present

## 2023-09-08 DIAGNOSIS — G8191 Hemiplegia, unspecified affecting right dominant side: Secondary | ICD-10-CM | POA: Diagnosis present

## 2023-09-08 DIAGNOSIS — Z7984 Long term (current) use of oral hypoglycemic drugs: Secondary | ICD-10-CM | POA: Diagnosis not present

## 2023-09-08 DIAGNOSIS — Z8673 Personal history of transient ischemic attack (TIA), and cerebral infarction without residual deficits: Secondary | ICD-10-CM

## 2023-09-08 DIAGNOSIS — Z882 Allergy status to sulfonamides status: Secondary | ICD-10-CM | POA: Diagnosis not present

## 2023-09-08 DIAGNOSIS — K5900 Constipation, unspecified: Secondary | ICD-10-CM | POA: Diagnosis not present

## 2023-09-08 DIAGNOSIS — R531 Weakness: Secondary | ICD-10-CM | POA: Diagnosis present

## 2023-09-08 DIAGNOSIS — R7989 Other specified abnormal findings of blood chemistry: Secondary | ICD-10-CM | POA: Diagnosis not present

## 2023-09-08 LAB — APTT: aPTT: 27 s (ref 24–36)

## 2023-09-08 LAB — RAPID URINE DRUG SCREEN, HOSP PERFORMED
Amphetamines: NOT DETECTED
Barbiturates: NOT DETECTED
Benzodiazepines: NOT DETECTED
Cocaine: NOT DETECTED
Opiates: NOT DETECTED
Tetrahydrocannabinol: NOT DETECTED

## 2023-09-08 LAB — CBC WITH DIFFERENTIAL/PLATELET
Abs Immature Granulocytes: 0.03 10*3/uL (ref 0.00–0.07)
Basophils Absolute: 0 10*3/uL (ref 0.0–0.1)
Basophils Relative: 0 %
Eosinophils Absolute: 0.1 10*3/uL (ref 0.0–0.5)
Eosinophils Relative: 2 %
HCT: 49 % (ref 39.0–52.0)
Hemoglobin: 17.1 g/dL — ABNORMAL HIGH (ref 13.0–17.0)
Immature Granulocytes: 0 %
Lymphocytes Relative: 31 %
Lymphs Abs: 2.4 10*3/uL (ref 0.7–4.0)
MCH: 33.5 pg (ref 26.0–34.0)
MCHC: 34.9 g/dL (ref 30.0–36.0)
MCV: 95.9 fL (ref 80.0–100.0)
Monocytes Absolute: 0.4 10*3/uL (ref 0.1–1.0)
Monocytes Relative: 5 %
Neutro Abs: 4.7 10*3/uL (ref 1.7–7.7)
Neutrophils Relative %: 62 %
Platelets: 198 10*3/uL (ref 150–400)
RBC: 5.11 MIL/uL (ref 4.22–5.81)
RDW: 12.4 % (ref 11.5–15.5)
WBC: 7.7 10*3/uL (ref 4.0–10.5)
nRBC: 0 % (ref 0.0–0.2)

## 2023-09-08 LAB — URINALYSIS, ROUTINE W REFLEX MICROSCOPIC
Bacteria, UA: NONE SEEN
Bilirubin Urine: NEGATIVE
Glucose, UA: >=500 mg/dL — AB
Hgb urine dipstick: NEGATIVE
Ketones, ur: NEGATIVE mg/dL
Leukocytes,Ua: NEGATIVE
Nitrite: NEGATIVE
Protein, ur: NEGATIVE mg/dL
Specific Gravity, Urine: 1.023 (ref 1.005–1.030)
pH: 6 (ref 5.0–8.0)

## 2023-09-08 LAB — I-STAT CHEM 8, ED
BUN: 13 mg/dL (ref 6–20)
Calcium, Ion: 1.14 mmol/L — ABNORMAL LOW (ref 1.15–1.40)
Chloride: 105 mmol/L (ref 98–111)
Creatinine, Ser: 0.8 mg/dL (ref 0.61–1.24)
Glucose, Bld: 136 mg/dL — ABNORMAL HIGH (ref 70–99)
HCT: 49 % (ref 39.0–52.0)
Hemoglobin: 16.7 g/dL (ref 13.0–17.0)
Potassium: 4.2 mmol/L (ref 3.5–5.1)
Sodium: 141 mmol/L (ref 135–145)
TCO2: 26 mmol/L (ref 22–32)

## 2023-09-08 LAB — COMPREHENSIVE METABOLIC PANEL WITH GFR
ALT: 35 U/L (ref 0–44)
AST: 27 U/L (ref 15–41)
Albumin: 4.2 g/dL (ref 3.5–5.0)
Alkaline Phosphatase: 60 U/L (ref 38–126)
Anion gap: 14 (ref 5–15)
BUN: 11 mg/dL (ref 6–20)
CO2: 24 mmol/L (ref 22–32)
Calcium: 9.2 mg/dL (ref 8.9–10.3)
Chloride: 103 mmol/L (ref 98–111)
Creatinine, Ser: 0.84 mg/dL (ref 0.61–1.24)
GFR, Estimated: >60 mL/min (ref >60)
Glucose, Bld: 135 mg/dL — ABNORMAL HIGH (ref 70–99)
Potassium: 4.1 mmol/L (ref 3.5–5.1)
Sodium: 141 mmol/L (ref 135–145)
Total Bilirubin: 1 mg/dL (ref 0.0–1.2)
Total Protein: 6.9 g/dL (ref 6.5–8.1)

## 2023-09-08 LAB — ETHANOL: Alcohol, Ethyl (B): <15 mg/dL (ref <15)

## 2023-09-08 LAB — MRSA NEXT GEN BY PCR, NASAL: MRSA by PCR Next Gen: NOT DETECTED

## 2023-09-08 LAB — GLUCOSE, CAPILLARY: Glucose-Capillary: 101 mg/dL — ABNORMAL HIGH (ref 70–99)

## 2023-09-08 LAB — MAGNESIUM: Magnesium: 2 mg/dL (ref 1.7–2.4)

## 2023-09-08 LAB — PROTIME-INR
INR: 1 (ref 0.8–1.2)
Prothrombin Time: 13.9 s (ref 11.4–15.2)

## 2023-09-08 MED ORDER — ACETAMINOPHEN 160 MG/5ML PO SOLN: 650.0000 mg | ORAL | Status: DC | PRN

## 2023-09-08 MED ORDER — SENNOSIDES-DOCUSATE SODIUM 8.6-50 MG PO TABS
1.0000 | ORAL_TABLET | Freq: Every evening | ORAL | Status: DC | PRN
  Administered 2023-09-10: 1 via ORAL
  Filled 2023-09-08: qty 1

## 2023-09-08 MED ORDER — IOHEXOL 350 MG/ML SOLN
75.0000 mL | Freq: Once | INTRAVENOUS | Status: AC | PRN
  Administered 2023-09-08: 75 mL via INTRAVENOUS

## 2023-09-08 MED ORDER — PANTOPRAZOLE SODIUM 40 MG IV SOLR
40.0000 mg | Freq: Every day | INTRAVENOUS | Status: DC
Start: 2023-09-08 — End: 2023-09-09

## 2023-09-08 MED ORDER — ACETAMINOPHEN 650 MG RE SUPP: 650.0000 mg | RECTAL | Status: DC | PRN

## 2023-09-08 MED ORDER — ATROPINE SULFATE 1 MG/10ML IJ SOSY
PREFILLED_SYRINGE | INTRAMUSCULAR | Status: AC
  Filled 2023-09-08: qty 10

## 2023-09-08 MED ORDER — STROKE: EARLY STAGES OF RECOVERY BOOK
Freq: Once | Status: AC
  Administered 2023-09-09: 1
  Filled 2023-09-08: qty 1

## 2023-09-08 MED ORDER — SODIUM CHLORIDE 0.9 % IV SOLN: INTRAVENOUS | Status: AC

## 2023-09-08 MED ORDER — SODIUM CHLORIDE 0.9 % IV BOLUS
1000.0000 mL | Freq: Once | INTRAVENOUS | Status: AC
  Administered 2023-09-08: 1000 mL via INTRAVENOUS

## 2023-09-08 MED ORDER — TENECTEPLASE FOR STROKE
0.2500 mg/kg | PACK | Freq: Once | INTRAVENOUS | Status: AC
  Administered 2023-09-08: 23 mg via INTRAVENOUS
  Filled 2023-09-08: qty 10

## 2023-09-08 MED ORDER — INSULIN ASPART 100 UNIT/ML IJ SOLN
0.0000 [IU] | Freq: Three times a day (TID) | INTRAMUSCULAR | Status: DC
  Administered 2023-09-09 – 2023-09-11 (×5): 2 [IU] via SUBCUTANEOUS

## 2023-09-08 MED ORDER — ACETAMINOPHEN 325 MG PO TABS: 650.0000 mg | ORAL_TABLET | ORAL | Status: DC | PRN

## 2023-09-08 NOTE — ED Triage Notes (Addendum)
 Patient arrives ambulatory by POV c/o intermittent right sided weakness. States at home he was having some balance problems and feeling cloudy then subsided. Was driving and then felt like couldn't lift his right leg. Patient states weakness has improved however still feels cloudy. Hx of stroke on plavix.

## 2023-09-08 NOTE — ED Notes (Signed)
 Pt to CT

## 2023-09-08 NOTE — Progress Notes (Signed)
 Bradycardia to 30s. Felt light headed. NIHSS not changed. Bolus 1L NS 12 lead EKG Will consult cardiology   Eligio Lav, MD

## 2023-09-08 NOTE — H&P (Addendum)
 NEUROLOGY H&P NOTE   Date of service: September 08, 2023 Patient Name: Tyler Hoffman MRN:  [id number] DOB:  1976/01/10 Chief Complaint: Right-sided weakness  History of Present Illness  Tyler Hoffman is a 47 y.o. male with hx of prior chronic lacunar infarctions noted on MRI, coronary artery disease status post PCI, hypertension, hyperlipidemia, diabetes, OSA on CPAP, presented for evaluation of sudden onset of right leg and arm weakness.  Last known well reported to be 10:15 AM.  He then went to the shower and noticed sudden onset of his right leg giving out.  He got dressed and got in the car and could not use his right leg to press on the gas.  As he was trying to move the shifter in the car, his right arm also felt weak. He was brought to the hospital via POV for stroke symptom evaluation. He was seen in the CT scanner as an acute code stroke Denies any chest pain shortness of breath nausea vomiting.  Denies visual changes.  Denies speech changes.  Denies facial weakness.   Last known well: 10:15 AM Modified rankin score: 0-Completely asymptomatic and back to baseline post- stroke IV Thrombolysis: Yes or No (reason) yes Thrombectomy: No ELVO NIHSS components Score: Comment  1a Level of Conscious 0[x]  1[]  2[]  3[]      1b LOC Questions 0[x]  1[]  2[]       1c LOC Commands 0[x]  1[]  2[]       2 Best Gaze 0[x]  1[]  2[]       3 Visual 0[x]  1[]  2[]  3[]      4 Facial Palsy 0[x]  1[]  2[]  3[]      5a Motor Arm - left 0[x]  1[]  2[]  3[]  4[]  UN[]    5b Motor Arm - Right 0[]  1[x]  2[]  3[]  4[]  UN[]    6a Motor Leg - Left 0[x]  1[]  2[]  3[]  4[]  UN[]    6b Motor Leg - Right 0[]  1[x]  2[]  3[]  4[]  UN[]    7 Limb Ataxia 0[x]  1[]  2[]  UN[]      8 Sensory 0[x]  1[]  2[]  UN[]      9 Best Language 0[x]  1[]  2[]  3[]      10 Dysarthria 0[x]  1[]  2[]  UN[]      11 Extinct. and Inattention 0[x]  1[]  2[]       TOTAL: 2      ROS  Comprehensive ROS performed and pertinent positives documented in the HPI   Past History   Past Medical  History:  Diagnosis Date   CAD S/P DES PCI-LAD (08/2020 - in [place]) 07/19/2020   CTA 07/11/2020 (performed in [place]): In response to abnormal functional study. Coronary Calcium  Score 0.3.  LAD and circumflex have separate ostia.  Appears to be occlusion of the LAD after SP1.  LCx gives off 1 major marginal branch with no significant disease.  RCA has diffuse atheroma with mod stenosis prior to Crux.  LVEF Nl Systolic Fxn. ->  Cath = DES PCI prox LAD SubTO, FFR Neg RCA.   Essential hypertension 05/22/2016   History of rosacea    Hyperlipidemia associated with type 2 diabetes mellitus (HCC) 08/22/2006   Qualifier: Diagnosis of  By: Baird FNP, Tyler Hoffman    Lacunar infarction (HCC) 05/2016   No residual infarct   Obesity (BMI 30-39.9) 10/16/2013   OSA on CPAP    Uses CPAP   Type II diabetes mellitus with complication (HCC)    Lacuna CVA &  CAD- PCI LAD   Past Surgical History:  Procedure Laterality Date   CORONARY STENT INTERVENTION  08/31/2020  [place], [place]: DES PCI LAD (subtotal occlusion ~ 7 mm, downstream of SP1); - Unknowns STENT Size/ Brand..   CT CTA CORONARY W/CA SCORE W/CM &/OR WO/CM  07/11/2020   Performed in [place] in response to abnormal functional study: o Coronary Calcium  Score 0.3.  LAD and circumflex have separate ostia.  Appears to be occlusion of the LAD after SP1.  LCx gives off 1 major marginal branch with no significant disease.  RCA has diffuse atheroma with moderate stenosis prior to the crux.  LVEF normal systolic function. ->  Coronary angiography recommended.   LEFT HEART CATH AND CORONARY ANGIOGRAPHY  08/31/2020   [place], [place]): Two-vessel CAD with subtotal occlusion just distal to SP1.  Subcritical stenosis of the right coronary artery (IFR and FFR negative). => LAD PCI performed with good result.  (Right radial access)   nasoplasty  08/11/2003   TRANSTHORACIC ECHOCARDIOGRAM  06/07/2016   With bubble study for CVA evaluation: ([place]) normal LV  function.  EF 66 5%.  No RWMA.  GR 1 DD.  No IAS, PFO or ASD noted.  No R-L shunt.  Normal valves.   TRANSTHORACIC ECHOCARDIOGRAM  12/2018   [place], [place]:ormal biventricular function.  EF 65%.  Normal wall motion.  No significant valvular disease.  No pleural effusion.  Normal aorta within the level of portable section.   TRANSTHORACIC ECHOCARDIOGRAM  08/2020   [place], [place]: Normal EF. evidence of hypertensive heart disease with normal biventricular function.  Normal aortic.  Normal valves.   Family History  Problem Relation Age of Onset   Heart attack Mother 24       PCI   Coronary artery disease Mother    Coronary artery disease Maternal Grandmother    Heart attack Maternal Grandmother    Diabetes Maternal Grandfather    Heart disease Paternal Grandmother    Heart disease Paternal Grandfather    Heart attack Paternal Grandfather    Coronary artery disease Paternal Grandfather 52 - 49   Coronary artery disease Paternal Uncle 21 - 38       CABG   Social History   Socioeconomic History   Marital status: Married    Spouse name: Location manager   Number of children: 4   Years of education: Not on file   Highest education level: Not on file  Occupational History   Occupation: Principle    Employer: [employer]    Comment: International aid/development worker   Occupation: TEACHER    Employer: [employer]   Occupation: IT sales professional    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  Institution,      Daily Caffeine Use 2-3 cups per day;   He walks a lot  during school at work at least 8-12,000 steps a day, does not get routine exercise.   Social Drivers of Corporate investment banker Strain: Not on file  Food Insecurity: Not on file  Transportation Needs: Not on file  Physical Activity: Not on file  Stress: Not on file  Social Connections: Not on file   Allergies  Allergen Reactions   Metformin Diarrhea   Metformin And Related Diarrhea   Pseudoephedrine Hypertension   Sulfa Antibiotics Diarrhea   Septra [Sulfamethoxazole-Trimethoprim] Diarrhea, Swelling and Rash   Sulfamethoxazole-Trimethoprim Swelling and Rash    Red all over, also   Sulfonamide Derivatives Swelling and Rash    Medications   Current Facility-Administered Medications:    [START ON 09/09/2023]  stroke: early stages of recovery book, , Does not apply, Once, Voncile Isles, MD   0.9 %  sodium chloride  infusion, , Intravenous, Continuous, Amilcar Reever, MD   acetaminophen  (TYLENOL ) tablet 650 mg, 650 mg, Oral, Q4H PRN **OR** acetaminophen  (TYLENOL ) 160 MG/5ML solution 650 mg, 650 mg, Per Tube, Q4H PRN **OR** acetaminophen  (TYLENOL ) suppository 650 mg, 650 mg, Rectal, Q4H PRN, Dustine Bertini, MD   pantoprazole  (PROTONIX ) injection 40 mg, 40 mg, Intravenous, QHS, Cheril Slattery, MD   senna-docusate (Senokot-S) tablet 1 tablet, 1 tablet, Oral, QHS PRN, Modean Mccullum, MD   tenecteplase  (TNKASE ) injection for Stroke 23 mg, 0.25 mg/kg, Intravenous, Once, Pamella Ozell LABOR, DO  Current Outpatient Medications:    bisoprolol  (ZEBETA ) 5 MG tablet, Take 2.5 mg by mouth daily., Disp: , Rfl:    clopidogrel (PLAVIX) 75 MG tablet, Take 1 tablet by mouth daily., Disp: , Rfl:    Dulaglutide  (TRULICITY ) 1.5 MG/0.5ML SOPN, Inject 1.5 mg into the skin once a week., Disp: 6 mL, Rfl: 0    empagliflozin  (JARDIANCE ) 10 MG TABS tablet, Take 1 tablet (10 mg total) by mouth daily., Disp: 90 tablet, Rfl: 3   ezetimibe  (ZETIA ) 10 MG tablet, Take 1 tablet (10 mg total) by mouth daily., Disp: 90 tablet, Rfl: 0   glucose blood (ONETOUCH VERIO) test strip, Use to check blood sugar 4 times a day.  Dx: E11.65., Disp: 120 each, Rfl: 11   Insulin  Pen Needle (PEN NEEDLES) 30G X 8 MM MISC, Inject 10 Units into the skin daily. Pen needles for Levemir  Flex Pen, Disp: 100 each, Rfl: 11   lisinopril  (ZESTRIL ) 20 MG tablet, Take 1 tablet (20 mg total) by mouth daily., Disp: 90 tablet, Rfl: 0   rosuvastatin  (CRESTOR ) 40 MG tablet, Take 1 tablet (40 mg total) by mouth daily., Disp: 90 tablet, Rfl: 0   tadalafil (CIALIS) 5 MG tablet, , Disp: , Rfl:    TRESIBA  FLEXTOUCH 100 UNIT/ML FlexTouch Pen, INJECT 60 UNITS INTO THE SKIN DAILY, Disp: 15 mL, Rfl: 1   Vitals   Vitals:   09/08/23 1209 09/08/23 1211  BP: (!) 154/97   Pulse: 89   Resp: 16   Temp: 98.3 F (36.8 C)   SpO2: 100%   Weight:  90.7 kg  Height:  5' 3 (1.6 m)     Body mass index is 35.43 kg/m.  Physical Exam  General: Obese individual, in no acute distress HEENT: Normocephalic, atraumatic Lungs: Clear Cardiovascular: Regular rhythm Neurological exam Awake alert oriented x 3 No dysarthria No aphasia Cranial nerves II 12 intact Motor examination with right upper extremity mild drift, right lower extremity mild drift-not hitting bed in 10 & 5 sec, left side full strength. Sensory exam: Sensation intact Coordination with no gross dysmetria  disproportionate to weakness   Labs   CBC:  Recent Labs  Lab 09/08/23 1222  WBC 7.7  NEUTROABS 4.7  HGB 17.1*  HCT 49.0  MCV 95.9  PLT 198   Basic Metabolic Panel:  Lab Results  Component Value Date   NA 143 08/30/2023   K 4.4 08/30/2023   CO2 30 08/30/2023   GLUCOSE 109 (H) 08/30/2023   BUN 13 08/30/2023   CREATININE 0.82 08/30/2023   CALCIUM  8.9 08/30/2023   GFRNONAA >60  10/04/2019   GFRAA >60 10/04/2019   Lipid Panel:  Lab Results  Component Value Date   LDLCALC 68 08/30/2023   HgbA1c:  Lab Results  Component Value Date   HGBA1C 8.1 (H) 08/30/2023   INR  Lab Results  Component Value Date   INR 1.04 04/26/2016   APTT  Lab Results  Component Value Date   APTT 29 04/26/2016     CT Head without contrast(Personally reviewed): Aspects 10.  No bleed  CT angio Head and Neck with contrast(Personally reviewed): No ELVO.  Multiple areas of intracranial atherosclerosis in both left and right hemisphere anterior and posterior circulation.   Assessment   Braelin Costlow is a 48 y.o. male with past medical history of prior chronic lacunar appearing infarctions on imaging with no clinical event that he was aware of, hypertension, hyperlipidemia, diabetes, CAD status post PCI, obesity, OSA, presenting for sudden onset of right-sided weakness.  Examination is consistent with right upper and lower extremity paresis. Clinically exam looks consistent with a lacunar infarction-pure motor type. Risk benefits and alternatives of IV TNKase  were discussed. He agreed to proceed. CT head was personally reviewed-no evidence of bleed. He will be admitted to ICU for further workup and post TNK  care  Impression: Acute ischemic stroke-likely small vessel etiology but currently etiology under investigation  Secondary Diagnosis: Essential (primary) hypertension, Type 2 diabetes mellitus with hyperglycemia , and Obesity Hyperlipidemia, sleep apnea  Recommendations  Admit to neurological ICU for post TNK care Maintained on telemetry Post TNK vitals and neurochecks Systolic blood pressure goal 180/105 or below post TNK for 24 hours. MRI 24 hours Stat CT head for any neurochange SCDs for DVT prophylaxis No antiplatelets or anticoagulants Check A1c, lipid panel PT, OT, speech therapy assessments Replete electrolytes as needed Gentle hydration Likely will need DAPT  after 24 hours-duration likely to be 3 months in addition to high intensity statin  CODE STATUS: Full code DVT prophylaxis: SCD GI prophylaxis: PPI Bowel: Docusate senna Diet: N.p.o. until cleared by bedside swallow evaluation  Plan discussed with the patient and his wife at bedside.  All questions answered. Plan also discussed with Dr. Pamella, EDP ______________________________________________________________________   Signed, Eligio Lav, MD [facility]  Risks, benefits and alternatives of IVT discussed with patient and/or family and they agreed.CTH personally reviewed prior to TNK administration   CRITICAL CARE ATTESTATION Performed by: Eligio Lav, MD Total critical care time: 55 minutes Critical care time was exclusive of separately billable procedures and treating other patients and/or supervising APPs/Residents/Students Critical care was necessary to treat or prevent imminent or life-threatening deterioration. This patient is critically ill and at significant risk for neurological worsening and/or death and care requires constant monitoring. Critical care was time spent personally by me on the following activities: development of treatment plan with patient and/or surrogate as well as nursing, discussions with consultants, evaluation of patient's response to treatment, examination of patient, obtaining history from patient or surrogate, ordering and performing treatments and interventions, ordering  and review of laboratory studies, ordering and review of radiographic studies, pulse oximetry, re-evaluation of patient's condition, participation in multidisciplinary rounds and medical decision making of high complexity in the care of this patient.

## 2023-09-08 NOTE — Progress Notes (Signed)
 Pt has home CPAP for the night. No assistance needed

## 2023-09-08 NOTE — Code Documentation (Signed)
 Stroke Response Nurse Documentation Code Documentation  Tyler Hoffman is a 48 y.o. male arriving to [facility]  via Private Vehicle with complaints of right weakness. LKW [id number] when he developed sudden right leg weakness while in the shower, progressing to right arm weakness. 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   Stroke team met patient in CT on activation. NIH 2, see flowsheet. CT head completed and reviewed, decision made to give TNK with patient consent--given 1234. CTA completed. No thrombectomy as no LVO. Care Plan: NIH and vitals q15x2h, q30x6h, q1hx16h then per unit routine. Handoff with [facility] ED RN.    Tonna Lacks K  Rapid Response RN

## 2023-09-08 NOTE — ED Notes (Signed)
 Dr. Arora at bedside updating pt/family.

## 2023-09-08 NOTE — Consult Note (Signed)
 Cardiology Consultation   Patient ID: Tyler Hoffman MRN: [id number]; DOB: [birth year]  Admit date: 09/08/2023 Date of Consult: 09/08/2023  PCP:  Avelina Greig BRAVO, MD   [facility] Providers Cardiologist:  Alm Clay, MD        Patient Profile: Tyler Hoffman is a 48 y.o. male with a hx of prior history of CVA, hypertension, hyperlipidemia, diabetes type 2, obesity, OSA, CAD s/p DES to LAD 08/2020 and HLD, moderate RCA disease FFR negative who is being seen 09/08/2023 for the evaluation of bradycardia at the request of Dr. Voncile.  History of Present Illness: Tyler Hoffman is a 48 y.o. male with a hx of prior history of CVA, hypertension, hyperlipidemia, diabetes type 2, obesity, OSA, CAD s/p DES to LAD 08/2020 and HLD, moderate RCA disease FFR negative who is being seen 09/08/2023 for the evaluation of bradycardia  Patient last seen Dr. Alm Clay 2023 for follow-up, maintained on Plavix after DAPT therapy, normal biventricular function.  He stays in a team for the most of the time and visits [facility] . Now brought into the hospital with complaints of right-sided weakness, started this morning, code stroke was called and neurology was involved and received TNK, his weakness hasIs completely resolved, but found to have bradycardia on telemetry thus cardiology is consulted   Patient reports he was getting ready to eat his supper suddenly felt right-sided weakness coming back and left hand tingling, feeling flushed and almost going to pass out, denies any pain.  Telemetry monitor showed sinus bradycardia with heart rate 32 bpm without any further AV block or significant pauses.  Patient did not lose consciousness and laid back and became normal.  Currently his heart rate is in the 80 bpm, normal sinus rhythm on EKG. Of note he also complains significant dyspnea on exertion but no chest pain, lives on 3 floor building in [place] going up and down he gets exhausted per wife. Does not take  metoprolol, is on Plavix and rosuvastatin   Prior cardiac history He was seen and evaluated by cardiologist in [place] in April-May 2022. Apparently underwent functional study that was positive and therefore underwent coronary CTA reviewed below that revealed possible occlusion of the LAD. Apparently he did undergo DES PCI in June 2022)-he was started on aspirin Plavix. Noted significant improvement in symptoms since PCI.    TTE with Bubble Study 06/07/2016: Normal LV function.  EF 60 to 65%.  No RWMA.  GR 1 DD.  No IAS, PFO or ASD noted.  No right to left shunt.  Normal valves.  Normal study Carotid Dopplers 05/24/2016: Normal carotid duplex negative for any hemodynamically significant stenosis involving extracranial carotids.   October 2020: Echocardiogram-normal biventricular function.  EF 65%.  Normal wall motion.  No significant valvular disease.  No pleural effusion.  Normal aorta within the level of portable section. Coronary CTA 07/11/2020 (performed in [place]): In response to abnormal functional study. Coronary Calcium  Score 0.3.  LAD and circumflex have separate ostia.  Appears to be occlusion of the LAD after SP1.  LCx gives off 1 major marginal branch with no significant disease.  RCA has diffuse atheroma with moderate stenosis prior to the crux.  LVEF normal systolic function. ->  Coronary angiography recommended.  Past Medical History:  Diagnosis Date   CAD S/P DES PCI-LAD (08/2020 - in [place]) 07/19/2020   CTA 07/11/2020 (performed in [place]): In response to abnormal functional study. Coronary Calcium  Score 0.3.  LAD and circumflex have separate ostia.  Appears to  be occlusion of the LAD after SP1.  LCx gives off 1 major marginal branch with no significant disease.  RCA has diffuse atheroma with mod stenosis prior to Crux.  LVEF Nl Systolic Fxn. ->  Cath = DES PCI prox LAD SubTO, FFR Neg RCA.   Essential hypertension 05/22/2016   History of rosacea    Hyperlipidemia associated with type 2  diabetes mellitus (HCC) 08/22/2006   Qualifier: Diagnosis of  By: Baird FNP, Frieda Kipper    Lacunar infarction (HCC) 05/2016   No residual infarct   Obesity (BMI 30-39.9) 10/16/2013   OSA on CPAP    Uses CPAP   Type II diabetes mellitus with complication (HCC)    [place] CVA &  CAD- PCI LAD    Past Surgical History:  Procedure Laterality Date   CORONARY STENT INTERVENTION  08/31/2020   [place], [place]: DES PCI LAD (subtotal occlusion ~ 7 mm, downstream of SP1); - Unknowns STENT Size/ Brand..   CT CTA CORONARY W/CA SCORE W/CM &/OR WO/CM  07/11/2020   Performed in [place] in response to abnormal functional study: o Coronary Calcium  Score 0.3.  LAD and circumflex have separate ostia.  Appears to be occlusion of the LAD after SP1.  LCx gives off 1 major marginal branch with no significant disease.  RCA has diffuse atheroma with moderate stenosis prior to the crux.  LVEF normal systolic function. ->  Coronary angiography recommended.   LEFT HEART CATH AND CORONARY ANGIOGRAPHY  08/31/2020   [place], [place]): Two-vessel CAD with subtotal occlusion just distal to SP1.  Subcritical stenosis of the right coronary artery (IFR and FFR negative). => LAD PCI performed with good result.  (Right radial access)   nasoplasty  08/11/2003   TRANSTHORACIC ECHOCARDIOGRAM  06/07/2016   With bubble study for CVA evaluation: ([place]) normal LV function.  EF 66 5%.  No RWMA.  GR 1 DD.  No IAS, PFO or ASD noted.  No R-L shunt.  Normal valves.   TRANSTHORACIC ECHOCARDIOGRAM  12/2018   [place], [place]:ormal biventricular function.  EF 65%.  Normal wall motion.  No significant valvular disease.  No pleural effusion.  Normal aorta within the level of portable section.   TRANSTHORACIC ECHOCARDIOGRAM  08/2020   [place], [place]: Normal EF. evidence of hypertensive heart disease with normal biventricular function.  Normal aortic.  Normal valves.     Home Medications:  Prior to Admission medications   Medication  Sig Start Date End Date Taking? Authorizing Provider  clopidogrel (PLAVIX) 75 MG tablet Take 1 tablet by mouth daily. 09/01/20  Yes [provider]  Dulaglutide  (TRULICITY ) 1.5 MG/0.5ML SOPN Inject 1.5 mg into the skin once a week. 09/05/21  Yes Bedsole, Amy E, MD  empagliflozin  (JARDIANCE ) 25 MG TABS tablet Take 25 mg by mouth daily.   Yes [provider]  rosuvastatin  (CRESTOR ) 40 MG tablet Take 1 tablet (40 mg total) by mouth daily. Patient taking differently: Take 40 mg by mouth at bedtime. 09/05/21  Yes Bedsole, Amy E, MD  tadalafil (CIALIS) 5 MG tablet Take 5 mg by mouth daily. 01/07/20  Yes [provider]  TRESIBA  FLEXTOUCH 100 UNIT/ML FlexTouch Pen INJECT 60 UNITS INTO THE SKIN DAILY Patient taking differently: Inject 60 Units into the skin at bedtime. 09/26/21  Yes Bedsole, Amy E, MD  bisoprolol (ZEBETA) 5 MG tablet Take 2.5 mg by mouth daily. Patient not taking: Reported on 09/08/2023    [provider]  ezetimibe  (ZETIA ) 10 MG tablet Take 1 tablet (  10 mg total) by mouth daily. Patient not taking: Reported on 09/08/2023 09/05/21   Avelina Greig BRAVO, MD  glucose blood (ONETOUCH VERIO) test strip Use to check blood sugar 4 times a day.  Dx: E11.65. 06/01/16   Avelina Greig BRAVO, MD  Insulin  Pen Needle (PEN NEEDLES) 30G X 8 MM MISC Inject 10 Units into the skin daily. Pen needles for Levemir  Flex Pen 10/12/16   Bedsole, Amy E, MD  lisinopril  (ZESTRIL ) 20 MG tablet Take 1 tablet (20 mg total) by mouth daily. Patient not taking: Reported on 09/08/2023 09/05/21   Avelina Greig BRAVO, MD    Scheduled Meds:  [START ON 09/09/2023]  stroke: early stages of recovery book   Does not apply Once   atropine       [START ON 09/09/2023] insulin  aspart  0-15 Units Subcutaneous TID WC   pantoprazole (PROTONIX) IV  40 mg Intravenous QHS   Continuous Infusions:  sodium chloride  40 mL/hr at 09/08/23 1700   PRN Meds: acetaminophen **OR** acetaminophen (TYLENOL) oral liquid 160 mg/5 mL  **OR** acetaminophen, atropine, senna-docusate  Allergies:    Allergies  Allergen Reactions   Metformin Diarrhea   Metformin And Related Diarrhea   Pseudoephedrine Hypertension   Sulfa Antibiotics Diarrhea   Septra [Sulfamethoxazole-Trimethoprim] Diarrhea, Swelling and Rash   Sulfamethoxazole-Trimethoprim Swelling and Rash    Red all over, also   Sulfonamide Derivatives Swelling and Rash    Social History:   Social History   Socioeconomic History   Marital status: Married    Spouse name: Location manager   Number of children: 4   Years of education: Not on file   Highest education level: Not on file  Occupational History   Occupation: Principle    Employer: [employer]    Comment: International aid/development worker   Occupation: TEACHER    Employer: [employer]   Occupation: IT sales professional    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        He has not taken a job working for the [employer] as a IT sales professional on the [employer] in [place], [place] --> he is not service related and therefore he gets his health care through a suspect.  Institution,      Daily Caffeine Use 2-3 cups per day;   He walks a lot during school at work at least 8-12,000 steps a day, does not get routine exercise.   Social Drivers of Corporate investment banker Strain: Not on file  Food Insecurity: Not on file  Transportation Needs: Not on file  Physical Activity: Not on file  Stress: Not on file  Social Connections: Not on file  Intimate Partner Violence: Not on file     Family History:    Family History  Problem Relation Age of Onset   Heart attack Mother 48       PCI   Coronary artery disease Mother    Coronary artery disease Maternal Grandmother  Heart attack Maternal Grandmother    Diabetes Maternal Grandfather    Heart disease Paternal Grandmother    Heart disease Paternal Grandfather    Heart attack Paternal Grandfather    Coronary artery disease Paternal Grandfather 60 - 49   Coronary artery disease Paternal Uncle 19 - 48       CABG     ROS:  Please see the history of present illness.   All other ROS reviewed and negative.     Physical Exam/Data: Vitals:   09/08/23 1645 09/08/23 1700 09/08/23 1715 09/08/23 1730  BP: (!) 114/94 118/73 132/86 118/69  Pulse: 83 79 92 (!) 54  Resp: 15 20 (!) 29 (!) 22  Temp:      TempSrc:      SpO2: 97% 94% 97% 98%  Weight:      Height:        Intake/Output Summary (Last 24 hours) at 09/08/2023 1750 Last data filed at 09/08/2023 1700 Gross per 24 hour  Intake 105.62 ml  Output 675 ml  Net -569.38 ml      09/08/2023   12:11 PM 09/11/2021    9:12 AM 09/05/2021    8:25 AM  Last 3 Weights  Weight (lbs) 200 lb 198 lb 3.2 oz 196 lb  Weight (kg) 90.719 kg 89.903 kg 88.905 kg     Body mass index is 35.43 kg/m.  General:  Well nourished, well developed, in no acute distress HEENT: normal Neck: no JVD Vascular: No carotid bruits; Distal pulses 2+ bilaterally Cardiac:  normal S1, S2; RRR; no murmur  Lungs:  clear to auscultation bilaterally, no wheezing, rhonchi or rales  Abd: soft, nontender, no hepatomegaly  Ext: no edema Musculoskeletal:  No deformities, BUE and BLE strength normal and equal Skin: warm and dry  Neuro:  CNs 2-12 intact, no focal abnormalities noted Psych:  Normal affect    Relevant CV Studies: As noted above  Laboratory Data: High Sensitivity Troponin:  No results for input(s): TROPONINIHS in the last 720 hours.   Chemistry Recent Labs  Lab 09/08/23 1222  09/08/23 1223  NA 141 141  K 4.1 4.2  CL 103 105  CO2 24  --   GLUCOSE 135* 136*  BUN 11 13  CREATININE 0.84 0.80  CALCIUM  9.2  --   GFRNONAA >60  --   ANIONGAP 14  --     Recent Labs  Lab 09/08/23 1222  PROT 6.9  ALBUMIN 4.2  AST 27  ALT 35  ALKPHOS 60  BILITOT 1.0   Lipids No results for input(s): CHOL, TRIG, HDL, LABVLDL, LDLCALC, CHOLHDL in the last 168 hours.  Hematology Recent Labs  Lab 09/08/23 1222 09/08/23 1223  WBC 7.7  --   RBC 5.11  --   HGB 17.1* 16.7  HCT 49.0 49.0  MCV 95.9  --   MCH 33.5  --   MCHC 34.9  --   RDW 12.4  --   PLT 198  --    Thyroid  No results for input(s): TSH, FREET4 in the last 168 hours.  BNPNo results for input(s): BNP, PROBNP in the last 168 hours.  DDimer No results for input(s): DDIMER in the last 168 hours.  Radiology/Studies:  CT ANGIO HEAD NECK W WO CM (CODE STROKE) Result Date: 09/08/2023 CLINICAL DATA:  49 year old male code stroke presentation. Right side weakness. EXAM: CT ANGIOGRAPHY HEAD AND NECK TECHNIQUE: Multidetector CT imaging of the head and neck was performed using the standard protocol during bolus administration  of intravenous contrast. Multiplanar CT image reconstructions and MIPs were obtained to evaluate the vascular anatomy. Carotid stenosis measurements (when applicable) are obtained utilizing NASCET criteria, using the distal internal carotid diameter as the denominator. RADIATION DOSE REDUCTION: This exam was performed according to the departmental dose-optimization program which includes automated exposure control, adjustment of the mA and/or kV according to patient size and/or use of iterative reconstruction technique. CONTRAST:  75mL OMNIPAQUE  IOHEXOL  350 MG/ML SOLN COMPARISON:  Head CT 1227 hours today. FINDINGS: CTA NECK Skeleton: No acute osseous abnormality identified. Upper chest: Negative. Other neck: Glottis is partially closed. Nonvascular neck soft tissue spaces are within  normal limits. Aortic arch: 3 vessel arch.  Minimal calcified arch atherosclerosis. Right carotid system: Patent to the skull base with no significant plaque. No stenosis. Left carotid system: Patent with no significant plaque. No stenosis. Vertebral arteries: Minimal plaque at the right subclavian artery origin. Normal right vertebral artery origin. Patent right vertebral artery to the skull base with no plaque, no stenosis. Normal proximal left subclavian artery and left vertebral artery origin. Codominant left vertebral artery is patent to the skull base with no plaque or stenosis. CTA HEAD Posterior circulation: Codominant distal vertebral arteries. Left V4 segment moderate calcified plaque, and moderate stenosis distal to the left PICA origin which remains patent (series 12, image 91). Right V4 segment is patent to the vertebrobasilar junction with mild stenosis. Right PICA origin is patent. Patent basilar artery with mild irregularity throughout its course. The basilar is diminutive with fetal type bilateral PCA origins, and functionally terminates in the SCA is. No significant basilar stenosis. SCA origins remain patent. Fetal bilateral PCA origins. Left PCA is patent with mild P2 segment irregularity. There is asymmetric moderate irregularity of the proximal right P2 segment, mild to moderate stenosis on series 8, image 16. Right PCA branches remain patent. Anterior circulation: Both ICA siphons are patent. Intermittent bilateral siphon calcified plaque with mild stenosis on the left. No stenosis on the right. Both posterior communicating artery origins are normal. Patent carotid termini. Normal MCA and ACA origins. Normal anterior communicating artery. Left ACA branches are within normal limits. There is moderate irregularity and stenosis of the right ACA mid to distal A2 best seen on series 8, image 19. ACA branches remain patent. Left MCA M1 segment and bifurcation are patent without stenosis. Left MCA  branches appear patent but there is a moderate stenosis of a posterior M3 branch on series 8, image 26. Right MCA M1 segment and bifurcation are patent without stenosis. Right MCA branches appear patent with only mild irregularity. Venous sinuses: Patent. Anatomic variants: Fetal type PCA origins, diminutive basilar artery which functionally terminates at the SCA is. Review of the MIP images confirms the above findings IMPRESSION: 1. Negative for large vessel occlusion. 2. But Positive for age advanced Intracranial Atherosclerosis. Multifocal stenoses, most notably: - Moderate stenosis of a Left MCA posterior M3 branch. - Moderate stenosis of the Left Vertebral distal V4 segment. - Moderate stenosis Right ACA A2 segment. - up to Moderate stenosis Right PCA P2 segment. 3. No significant extracranial stenosis. Salient results were communicated to Dr. Voncile at 1258 hours on 09/08/2023 by text page via the [facility] messaging system. Electronically Signed   By: VEAR Hurst M.D.   On: 09/08/2023 13:00   CT HEAD CODE STROKE WO CONTRAST Result Date: 09/08/2023 CLINICAL DATA:  Code stroke. 48 year old male with right side weakness. EXAM: CT HEAD WITHOUT CONTRAST TECHNIQUE: Contiguous axial images were obtained from the  base of the skull through the vertex without intravenous contrast. RADIATION DOSE REDUCTION: This exam was performed according to the departmental dose-optimization program which includes automated exposure control, adjustment of the mA and/or kV according to patient size and/or use of iterative reconstruction technique. COMPARISON:  Brain MRI 06/06/2016.  Head CT 04/26/2016. FINDINGS: Brain: No midline shift, ventriculomegaly, mass effect, evidence of mass lesion, intracranial hemorrhage or evidence of cortically based acute infarction. Small chronic lacunar infarcts in the bilateral deep gray nuclei, many were present on the 2018 MRI. Superimposed posterior right lentiform perivascular space (normal variant).  Brainstem and cerebellum seems spared. Overall cerebral volume remains normal. No cortical encephalomalacia identified. Vascular: No suspicious intracranial vascular hyperdensity. Skull: Intact.  No acute osseous abnormality identified. Sinuses/Orbits: Visualized paranasal sinuses and mastoids are well aerated. Other: No gaze deviation. No acute orbit or scalp soft tissue finding. ASPECTS [facility] Stroke Program Early CT Score) Total score (0-10 with 10 being normal): 10 IMPRESSION: 1. No acute cortically based infarct or acute intracranial hemorrhage identified. ASPECTS 10. 2. Chronically advanced small vessel disease in the deep gray nuclei, does not appear significantly changed from a 2018 MRI. These results were communicated to Dr. Arora at 12:35 pm on 09/08/2023 by text page via the [facility] messaging system. Electronically Signed   By: VEAR Hurst M.D.   On: 09/08/2023 12:35     Assessment and Plan: Sinus bradycardia-> heart rate 32 bpm likely vagal episode CVA status post TNK CAD s/p DES LAD 2022 in [place], moderate RCA disease FFR -2022 Significant vascular disease. Diabetes type 2. Hypertension. Hyperlipidemia Obesity   Plan: -> Continue to monitor on telemetry,, obtain echocardiogram in a.m. ->  Based on history and telemetry monitoring of sinus bradycardia sounds like a vagal episode.  Do not anticipate need any further investigation other than echocardiogram.  Patient is not on any rate controlling agent. Reinstitute Plavix and statin post TNK CVA and blood pressure management per primary team. ->he reports exertion dyspnea, will see his primary cardiologist on Wednesday if he gets discharged.  Dyspnea on exertion could be multifactorial from his obesity, deconditioning, versus could be anginal equivalent.   -> Use CPAP regularly for OSA.   Will follow along   Risk Assessment/Risk Scores:              For questions or updates, please contact [facility] Please consult  [email or web address] for contact info under    Signed, Grayce Bold, MD  09/08/2023 5:50 PM

## 2023-09-08 NOTE — Progress Notes (Signed)
 PHARMACIST CODE STROKE RESPONSE  Notified to mix TNK at 1233 by Dr. Voncile TNK preparation completed at 1234  TNK dose = 23 mg IV over 5 seconds.   Issues/delays encountered (if applicable):   Tyler Hoffman 09/08/23 12:35 PM

## 2023-09-08 NOTE — ED Provider Notes (Signed)
 [place] [facility] AT [facility] Provider Note   CSN: [id number] Arrival date & time: 09/08/23  1200     Patient presents with: Code Stroke   Tyler Hoffman is a 48 y.o. male.  With a history of lacunar infarcts on aspirin Plavix, CAD, hypertension and type 2 diabetes who presents to the ED given concern for stroke.  Patient presented for triage given concern for new onset neurologic symptoms of right upper extremity and right lower extremity weakness that occurred while he was in the shower around 1015 or 1030 this morning.  Deficits have persisted.  No changes in speech headaches fevers chills or recent illness.  No anticoagulation beyond aspirin Plavix   HPI     Prior to Admission medications   Medication Sig Start Date End Date Taking? Authorizing Provider  bisoprolol (ZEBETA) 5 MG tablet Take 2.5 mg by mouth daily.    [provider]  clopidogrel (PLAVIX) 75 MG tablet Take 1 tablet by mouth daily. 09/01/20   [provider]  Dulaglutide  (TRULICITY ) 1.5 MG/0.5ML SOPN Inject 1.5 mg into the skin once a week. 09/05/21   Bedsole, Amy E, MD  empagliflozin  (JARDIANCE ) 10 MG TABS tablet Take 1 tablet (10 mg total) by mouth daily. 12/19/21   Bedsole, Amy E, MD  ezetimibe  (ZETIA ) 10 MG tablet Take 1 tablet (10 mg total) by mouth daily. 09/05/21   Bedsole, Amy E, MD  glucose blood (ONETOUCH VERIO) test strip Use to check blood sugar 4 times a day.  Dx: E11.65. 06/01/16   Avelina Greig BRAVO, MD  Insulin  Pen Needle (PEN NEEDLES) 30G X 8 MM MISC Inject 10 Units into the skin daily. Pen needles for Levemir  Flex Pen 10/12/16   Bedsole, Amy E, MD  lisinopril  (ZESTRIL ) 20 MG tablet Take 1 tablet (20 mg total) by mouth daily. 09/05/21   Bedsole, Amy E, MD  rosuvastatin  (CRESTOR ) 40 MG tablet Take 1 tablet (40 mg total) by mouth daily. 09/05/21   Avelina Greig BRAVO, MD  tadalafil (CIALIS) 5 MG tablet  01/07/20   [provider]  TRESIBA  FLEXTOUCH 100 UNIT/ML FlexTouch  Pen INJECT 60 UNITS INTO THE SKIN DAILY 09/26/21   Avelina, Amy E, MD    Allergies: Metformin, Metformin and related, Pseudoephedrine, Sulfa antibiotics, Septra [sulfamethoxazole-trimethoprim], Sulfamethoxazole-trimethoprim, and Sulfonamide derivatives    Review of Systems  Updated Vital Signs BP (!) 154/97   Pulse 89   Temp 98.3 F (36.8 C)   Resp 16   Ht 5' 3 (1.6 m)   Wt 90.7 kg   SpO2 100%   BMI 35.43 kg/m   Physical Exam Vitals and nursing note reviewed.  HENT:     Head: Normocephalic and atraumatic.   Eyes:     Pupils: Pupils are equal, round, and reactive to light.    Cardiovascular:     Rate and Rhythm: Normal rate and regular rhythm.  Pulmonary:     Effort: Pulmonary effort is normal.     Breath sounds: Normal breath sounds.  Abdominal:     Palpations: Abdomen is soft.     Tenderness: There is no abdominal tenderness.   Skin:    General: Skin is warm and dry.   Neurological:     Mental Status: He is alert and oriented to person, place, and time.     Sensory: No sensory deficit.     Comments: No drift in the upper extremities Right lower extremity drift with decreased motor strength 4-5 Clear fluent speech  Awake alert oriented Sensation tact light touch throughout   Psychiatric:        Mood and Affect: Mood normal.     (all labs ordered are listed, but only abnormal results are displayed) Labs Reviewed  CBC WITH DIFFERENTIAL/PLATELET - Abnormal; Notable for the following components:      Result Value   Hemoglobin 17.1 (*)    All other components within normal limits  COMPREHENSIVE METABOLIC PANEL WITH GFR  PROTIME-INR  URINALYSIS, ROUTINE W REFLEX MICROSCOPIC  ETHANOL  APTT  RAPID URINE DRUG SCREEN, HOSP PERFORMED  HIV ANTIBODY (ROUTINE TESTING W REFLEX)    EKG: None  Radiology: CT HEAD CODE STROKE WO CONTRAST Result Date: 09/08/2023 CLINICAL DATA:  Code stroke. 48 year old male with right side weakness. EXAM: CT HEAD WITHOUT CONTRAST  TECHNIQUE: Contiguous axial images were obtained from the base of the skull through the vertex without intravenous contrast. RADIATION DOSE REDUCTION: This exam was performed according to the departmental dose-optimization program which includes automated exposure control, adjustment of the mA and/or kV according to patient size and/or use of iterative reconstruction technique. COMPARISON:  Brain MRI 06/06/2016.  Head CT 04/26/2016. FINDINGS: Brain: No midline shift, ventriculomegaly, mass effect, evidence of mass lesion, intracranial hemorrhage or evidence of cortically based acute infarction. Small chronic lacunar infarcts in the bilateral deep gray nuclei, many were present on the 2018 MRI. Superimposed posterior right lentiform perivascular space (normal variant). Brainstem and cerebellum seems spared. Overall cerebral volume remains normal. No cortical encephalomalacia identified. Vascular: No suspicious intracranial vascular hyperdensity. Skull: Intact.  No acute osseous abnormality identified. Sinuses/Orbits: Visualized paranasal sinuses and mastoids are well aerated. Other: No gaze deviation. No acute orbit or scalp soft tissue finding. ASPECTS [facility] Stroke Program Early CT Score) Total score (0-10 with 10 being normal): 10 IMPRESSION: 1. No acute cortically based infarct or acute intracranial hemorrhage identified. ASPECTS 10. 2. Chronically advanced small vessel disease in the deep gray nuclei, does not appear significantly changed from a 2018 MRI. These results were communicated to Dr. Arora at 12:35 pm on 09/08/2023 by text page via the [facility] messaging system. Electronically Signed   By: VEAR Hurst M.D.   On: 09/08/2023 12:35     .Critical Care  Performed by: Pamella Ozell LABOR, DO Authorized by: Pamella Ozell LABOR, DO   Critical care provider statement:    Critical care time (minutes):  30   Critical care time was exclusive of:  Separately billable procedures and treating other patients and  teaching time   Critical care was necessary to treat or prevent imminent or life-threatening deterioration of the following conditions:  CNS failure or compromise   Critical care was time spent personally by me on the following activities:  Development of treatment plan with patient or surrogate, discussions with consultants, evaluation of patient's response to treatment, examination of patient, ordering and review of laboratory studies, ordering and review of radiographic studies, ordering and performing treatments and interventions, pulse oximetry, re-evaluation of patient's condition, review of old charts and obtaining history from patient or surrogate   I assumed direction of critical care for this patient from another provider in my specialty: no     Care discussed with: admitting provider   Comments:     Discussed with Dr. Voncile    Medications Ordered in the ED  tenecteplase (TNKASE) injection for Stroke 23 mg (has no administration in time range)   stroke: early stages of recovery book (has no administration in time range)  0.9 %  sodium chloride  infusion (has no administration in time range)  acetaminophen (TYLENOL) tablet 650 mg (has no administration in time range)    Or  acetaminophen (TYLENOL) 160 MG/5ML solution 650 mg (has no administration in time range)    Or  acetaminophen (TYLENOL) suppository 650 mg (has no administration in time range)  senna-docusate (Senokot-S) tablet 1 tablet (has no administration in time range)  pantoprazole (PROTONIX) injection 40 mg (has no administration in time range)    Clinical Course as of 09/08/23 1242  Sun Sep 08, 2023  1201 Patient arrival in ED triage.  Last known well time 1015 today. [MP]  1210 Code stroke activated from triage.  Patient examined with neurology team and transported back to CT immediately [MP]  1230 No obvious bleed on CT head.  Dr. Arora (neurology) has discussed risks and benefits of TNK with the patient given his  persistent right-sided deficits.  Patient acknowledges the risks and benefits and would like to proceed with TNK which we will administer here.  He will subsequently be admitted to neurology service for further monitoring and treatment [MP]    Clinical Course User Index [MP] Pamella Ozell LABOR, DO                                 Medical Decision Making 48 year old male with history as above presenting given concern for acute stroke.  Right upper extremity right lower extremity weakness acute onset around this morning in the shower.  Deficits have persisted.  Afebrile slightly hypertensive here.  Airway intact.  Clear fluent speech fully awake alert oriented.  Some obvious right lower extremity weakness and drift on exam.  Will activate code stroke and transported directly to CT  Risk Decision regarding hospitalization.        Final diagnoses:  Acute ischemic stroke [facility])    ED Discharge Orders     None          Pamella Ozell LABOR, DO 09/08/23 1242

## 2023-09-08 NOTE — ED Provider Triage Note (Signed)
 Emergency Medicine Provider Triage Evaluation Note  Danon Lograsso , a 48 y.o. male  was evaluated in triage.  Pt complains of right sided weakness which began approximately an hour ago but has now improved.  He does report right sided weakness, reports that he felt right arm, right leg weakness.  Got in his car, felt like he had to drag his leg in order to move it.  He has a prior history of lacunar infarct, currently on Plavix.  Reports symptoms have improved but they are not back to baseline.  Review of Systems  Positive: Headache, weakness Negative: Cp,sob  Physical Exam  BP (!) 154/97   Pulse 89   Temp 98.3 F (36.8 C)   Resp 16   Ht 5' 3 (1.6 m)   Wt 90.7 kg   SpO2 100%   BMI 35.43 kg/m  Gen:   Awake, no distress   Resp:  Normal effort  MSK:   Moves extremities without difficulty  Other:   Medical Decision Making  Medically screening exam initiated at 12:12 PM.  Appropriate orders placed.  Segundo Makela was informed that the remainder of the evaluation will be completed by another provider, this initial triage assessment does not replace that evaluation, and the importance of remaining in the ED until their evaluation is complete.  Teacher, English as a foreign language, code stroke called.    Noeh Sparacino, PA-C 09/08/23 1214

## 2023-09-09 ENCOUNTER — Other Ambulatory Visit (HOSPITAL_COMMUNITY): Payer: Self-pay

## 2023-09-09 ENCOUNTER — Telehealth: Payer: Self-pay | Admitting: *Deleted

## 2023-09-09 ENCOUNTER — Inpatient Hospital Stay (HOSPITAL_COMMUNITY)

## 2023-09-09 ENCOUNTER — Telehealth (HOSPITAL_COMMUNITY): Payer: Self-pay | Admitting: Pharmacy Technician

## 2023-09-09 ENCOUNTER — Inpatient Hospital Stay (HOSPITAL_BASED_OUTPATIENT_CLINIC_OR_DEPARTMENT_OTHER)
Admit: 2023-09-09 | Discharge: 2023-09-09 | Disposition: A | Discharge status: Home / Self Care | Attending: Student | Admitting: Student

## 2023-09-09 DIAGNOSIS — Z7984 Long term (current) use of oral hypoglycemic drugs: Secondary | ICD-10-CM

## 2023-09-09 DIAGNOSIS — I6381 Other cerebral infarction due to occlusion or stenosis of small artery: Secondary | ICD-10-CM

## 2023-09-09 DIAGNOSIS — Z794 Long term (current) use of insulin: Secondary | ICD-10-CM

## 2023-09-09 DIAGNOSIS — R29700 NIHSS score 0: Secondary | ICD-10-CM

## 2023-09-09 DIAGNOSIS — R001 Bradycardia, unspecified: Secondary | ICD-10-CM

## 2023-09-09 DIAGNOSIS — I6389 Other cerebral infarction: Secondary | ICD-10-CM | POA: Diagnosis not present

## 2023-09-09 DIAGNOSIS — I639 Cerebral infarction, unspecified: Secondary | ICD-10-CM

## 2023-09-09 DIAGNOSIS — I10 Essential (primary) hypertension: Secondary | ICD-10-CM | POA: Diagnosis not present

## 2023-09-09 DIAGNOSIS — Z7985 Long-term (current) use of injectable non-insulin antidiabetic drugs: Secondary | ICD-10-CM

## 2023-09-09 DIAGNOSIS — E785 Hyperlipidemia, unspecified: Secondary | ICD-10-CM | POA: Diagnosis not present

## 2023-09-09 LAB — ECHOCARDIOGRAM COMPLETE
AR max vel: 2.92 cm2
AV Peak grad: 4.9 mmHg
Ao pk vel: 1.11 m/s
Area-P 1/2: 3.85 cm2
Height: 63 in
S' Lateral: 3.1 cm
Weight: 3200 [oz_av]

## 2023-09-09 LAB — LIPID PANEL
Cholesterol: 171 mg/dL (ref 0–200)
HDL: 42 mg/dL (ref >40)
LDL Cholesterol: 98 mg/dL (ref 0–99)
Total CHOL/HDL Ratio: 4.1 ratio
Triglycerides: 155 mg/dL — ABNORMAL HIGH (ref <150)
VLDL: 31 mg/dL (ref 0–40)

## 2023-09-09 LAB — GLUCOSE, CAPILLARY
Glucose-Capillary: 121 mg/dL — ABNORMAL HIGH (ref 70–99)
Glucose-Capillary: 130 mg/dL — ABNORMAL HIGH (ref 70–99)
Glucose-Capillary: 164 mg/dL — ABNORMAL HIGH (ref 70–99)

## 2023-09-09 LAB — HIV ANTIBODY (ROUTINE TESTING W REFLEX): HIV Screen 4th Generation wRfx: NONREACTIVE

## 2023-09-09 MED ORDER — PANTOPRAZOLE SODIUM 40 MG PO TBEC
40.0000 mg | DELAYED_RELEASE_TABLET | Freq: Every day | ORAL | Status: DC
  Administered 2023-09-09: 40 mg via ORAL
  Filled 2023-09-09: qty 1

## 2023-09-09 MED ORDER — ASPIRIN 81 MG PO TBEC
81.0000 mg | DELAYED_RELEASE_TABLET | Freq: Every day | ORAL | Status: DC
  Administered 2023-09-09 – 2023-09-11 (×3): 81 mg via ORAL
  Filled 2023-09-09 (×3): qty 1

## 2023-09-09 MED ORDER — TICAGRELOR 90 MG PO TABS
90.0000 mg | ORAL_TABLET | Freq: Two times a day (BID) | ORAL | Status: DC
  Administered 2023-09-09 – 2023-09-11 (×5): 90 mg via ORAL
  Filled 2023-09-09 (×5): qty 1

## 2023-09-09 MED ORDER — ROSUVASTATIN CALCIUM 20 MG PO TABS
40.0000 mg | ORAL_TABLET | Freq: Every day | ORAL | Status: DC
  Administered 2023-09-09 – 2023-09-11 (×3): 40 mg via ORAL
  Filled 2023-09-09 (×3): qty 2

## 2023-09-09 NOTE — Telephone Encounter (Signed)
 Spoke with [name].  Tyler Hoffman is currently admitted in ICU at [facility] for Acute Ischemic Stroke.  They are still waiting on having the MRI done today.  She wanted us  to cancel [name]'s CPE scheduled for tomorrow.  Appointment cancelled as requested.  FYI to Dr. Avelina.

## 2023-09-09 NOTE — Progress Notes (Signed)
 Echocardiogram 2D Echocardiogram has been performed.  Tyler Hoffman 09/09/2023, 5:36 PM

## 2023-09-09 NOTE — Progress Notes (Signed)
 Pt has home CPAP.

## 2023-09-09 NOTE — Progress Notes (Signed)
 Cardiologist:  Anner  Subjective:  Denies SSCP, palpitations or Dyspnea Patient lives in [place] with wife and 4 children Only back visiting till End of month   Objective:  Vitals:   09/09/23 0600 09/09/23 0700 09/09/23 0730 09/09/23 0800  BP: 98/66 113/67 102/62 102/85  Pulse: 68 72 83 71  Resp: 20 (!) 21 17 (!) 25  Temp:      TempSrc:      SpO2: 94% 94% 99% 98%  Weight:      Height:        Intake/Output from previous day:  Intake/Output Summary (Last 24 hours) at 09/09/2023 9177 Last data filed at 09/09/2023 0800 Gross per 24 hour  Intake 531.09 ml  Output 2475 ml  Net -1943.91 ml    Physical Exam: Affect appropriate Healthy:  appears stated age HEENT: normal Neck supple with no adenopathy JVP normal no bruits no thyromegaly Lungs clear with no wheezing and good diaphragmatic motion Heart:  S1/S2 no murmur, no rub, gallop or click PMI normal Abdomen: benighn, BS positve, no tenderness, no AAA no bruit.  No HSM or HJR Distal pulses intact with no bruits No edema Neuro mild RLE weakness  Skin warm and dry No muscular weakness   Lab Results: Basic Metabolic Panel: Recent Labs    09/08/23 1222 09/08/23 1223  NA 141 141  K 4.1 4.2  CL 103 105  CO2 24  --   GLUCOSE 135* 136*  BUN 11 13  CREATININE 0.84 0.80  CALCIUM  9.2  --   MG 2.0  --    Liver Function Tests: Recent Labs    09/08/23 1222  AST 27  ALT 35  ALKPHOS 60  BILITOT 1.0  PROT 6.9  ALBUMIN 4.2   No results for input(s): LIPASE, AMYLASE in the last 72 hours. CBC: Recent Labs    09/08/23 1222 09/08/23 1223  WBC 7.7  --   NEUTROABS 4.7  --   HGB 17.1* 16.7  HCT 49.0 49.0  MCV 95.9  --   PLT 198  --     Imaging: CT ANGIO HEAD NECK W WO CM (CODE STROKE) Result Date: 09/08/2023 CLINICAL DATA:  48 year old male code stroke presentation. Right side weakness. EXAM: CT ANGIOGRAPHY HEAD AND NECK TECHNIQUE: Multidetector CT imaging of the head and neck was performed using the  standard protocol during bolus administration of intravenous contrast. Multiplanar CT image reconstructions and MIPs were obtained to evaluate the vascular anatomy. Carotid stenosis measurements (when applicable) are obtained utilizing NASCET criteria, using the distal internal carotid diameter as the denominator. RADIATION DOSE REDUCTION: This exam was performed according to the departmental dose-optimization program which includes automated exposure control, adjustment of the mA and/or kV according to patient size and/or use of iterative reconstruction technique. CONTRAST:  75mL OMNIPAQUE  IOHEXOL  350 MG/ML SOLN COMPARISON:  Head CT 1227 hours today. FINDINGS: CTA NECK Skeleton: No acute osseous abnormality identified. Upper chest: Negative. Other neck: Glottis is partially closed. Nonvascular neck soft tissue spaces are within normal limits. Aortic arch: 3 vessel arch.  Minimal calcified arch atherosclerosis. Right carotid system: Patent to the skull base with no significant plaque. No stenosis. Left carotid system: Patent with no significant plaque. No stenosis. Vertebral arteries: Minimal plaque at the right subclavian artery origin. Normal right vertebral artery origin. Patent right vertebral artery to the skull base with no plaque, no stenosis. Normal proximal left subclavian artery and left vertebral artery origin. Codominant left vertebral artery is patent to the skull base  with no plaque or stenosis. CTA HEAD Posterior circulation: Codominant distal vertebral arteries. Left V4 segment moderate calcified plaque, and moderate stenosis distal to the left PICA origin which remains patent (series 12, image 91). Right V4 segment is patent to the vertebrobasilar junction with mild stenosis. Right PICA origin is patent. Patent basilar artery with mild irregularity throughout its course. The basilar is diminutive with fetal type bilateral PCA origins, and functionally terminates in the SCA is. No significant basilar  stenosis. SCA origins remain patent. Fetal bilateral PCA origins. Left PCA is patent with mild P2 segment irregularity. There is asymmetric moderate irregularity of the proximal right P2 segment, mild to moderate stenosis on series 8, image 16. Right PCA branches remain patent. Anterior circulation: Both ICA siphons are patent. Intermittent bilateral siphon calcified plaque with mild stenosis on the left. No stenosis on the right. Both posterior communicating artery origins are normal. Patent carotid termini. Normal MCA and ACA origins. Normal anterior communicating artery. Left ACA branches are within normal limits. There is moderate irregularity and stenosis of the right ACA mid to distal A2 best seen on series 8, image 19. ACA branches remain patent. Left MCA M1 segment and bifurcation are patent without stenosis. Left MCA branches appear patent but there is a moderate stenosis of a posterior M3 branch on series 8, image 26. Right MCA M1 segment and bifurcation are patent without stenosis. Right MCA branches appear patent with only mild irregularity. Venous sinuses: Patent. Anatomic variants: Fetal type PCA origins, diminutive basilar artery which functionally terminates at the SCA is. Review of the MIP images confirms the above findings IMPRESSION: 1. Negative for large vessel occlusion. 2. But Positive for age advanced Intracranial Atherosclerosis. Multifocal stenoses, most notably: - Moderate stenosis of a Left MCA posterior M3 branch. - Moderate stenosis of the Left Vertebral distal V4 segment. - Moderate stenosis Right ACA A2 segment. - up to Moderate stenosis Right PCA P2 segment. 3. No significant extracranial stenosis. Salient results were communicated to Dr. Voncile at 1258 hours on 09/08/2023 by text page via the [facility] messaging system. Electronically Signed   By: VEAR Hurst M.D.   On: 09/08/2023 13:00   CT HEAD CODE STROKE WO CONTRAST Result Date: 09/08/2023 CLINICAL DATA:  Code stroke. 48 year old male  with right side weakness. EXAM: CT HEAD WITHOUT CONTRAST TECHNIQUE: Contiguous axial images were obtained from the base of the skull through the vertex without intravenous contrast. RADIATION DOSE REDUCTION: This exam was performed according to the departmental dose-optimization program which includes automated exposure control, adjustment of the mA and/or kV according to patient size and/or use of iterative reconstruction technique. COMPARISON:  Brain MRI 06/06/2016.  Head CT 04/26/2016. FINDINGS: Brain: No midline shift, ventriculomegaly, mass effect, evidence of mass lesion, intracranial hemorrhage or evidence of cortically based acute infarction. Small chronic lacunar infarcts in the bilateral deep gray nuclei, many were present on the 2018 MRI. Superimposed posterior right lentiform perivascular space (normal variant). Brainstem and cerebellum seems spared. Overall cerebral volume remains normal. No cortical encephalomalacia identified. Vascular: No suspicious intracranial vascular hyperdensity. Skull: Intact.  No acute osseous abnormality identified. Sinuses/Orbits: Visualized paranasal sinuses and mastoids are well aerated. Other: No gaze deviation. No acute orbit or scalp soft tissue finding. ASPECTS [facility] Stroke Program Early CT Score) Total score (0-10 with 10 being normal): 10 IMPRESSION: 1. No acute cortically based infarct or acute intracranial hemorrhage identified. ASPECTS 10. 2. Chronically advanced small vessel disease in the deep gray nuclei, does not appear significantly changed  from a 2018 MRI. These results were communicated to Dr. Arora at 12:35 pm on 09/08/2023 by text page via the [facility] messaging system. Electronically Signed   By: VEAR Hurst M.D.   On: 09/08/2023 12:35    Cardiac Studies:  ECG: NSR no AV block normal intervals    Telemetry: NSR rates 70-80's   Echo: pending   Medications:     stroke: early stages of recovery book   Does not apply Once   insulin  aspart  0-15 Units  Subcutaneous TID WC   pantoprazole (PROTONIX) IV  40 mg Intravenous QHS      sodium chloride  900 mL/hr at 09/08/23 1739    Assessment/Plan:  Tyler Hoffman is a 48 y.o. male with a hx of prior history of CVA, hypertension, hyperlipidemia, diabetes type 2, obesity, OSA, CAD s/p DES to LAD 08/2020 and HLD, moderate RCA disease FFR negative who is being seen 09/08/2023 for the evaluation of bradycardia at the request of Dr. Voncile.   Bradycardia:  ? Vagal in setting of TIA. No indication for pacer Ok to resume zebeta on d/c. He is going back to [place] end of month Has a cardiologist there can consider outpatient monitor here for 2 weeks if d/c soon CAD DES to LAD 07/11/20  appears that he was on Plavix at home would resume per neuro post TNK for TIA TTE pending  TIA:  no PAF carotids ok some intracranial arterial dx per neuro   Resume plavix when ok with neuro   Maude Emmer 09/09/2023, 8:22 AM

## 2023-09-09 NOTE — Progress Notes (Addendum)
 STROKE TEAM PROGRESS NOTE    SIGNIFICANT HOSPITAL EVENTS 6/29: Patient admitted with right-sided weakness and given TNK 6/30: Patient found to have left paramedian pontine infarct  INTERIM HISTORY/SUBJECTIVE  Patient has remained hemodynamically stable and afebrile overnight.  He has had no bleeding complications.  MRI demonstrates left paramedian pontine infarct. He still has some subjective heaviness in the right upper extremity but neurological exam shows no focal deficits. OBJECTIVE  CBC    Component Value Date/Time   WBC 7.7 09/08/2023 1222   RBC 5.11 09/08/2023 1222   HGB 16.7 09/08/2023 1223   HCT 49.0 09/08/2023 1223   PLT 198 09/08/2023 1222   MCV 95.9 09/08/2023 1222   MCH 33.5 09/08/2023 1222   MCHC 34.9 09/08/2023 1222   RDW 12.4 09/08/2023 1222   LYMPHSABS 2.4 09/08/2023 1222   MONOABS 0.4 09/08/2023 1222   EOSABS 0.1 09/08/2023 1222   BASOSABS 0.0 09/08/2023 1222    BMET    Component Value Date/Time   NA 141 09/08/2023 1223   K 4.2 09/08/2023 1223   CL 105 09/08/2023 1223   CO2 24 09/08/2023 1222   GLUCOSE 136 (H) 09/08/2023 1223   BUN 13 09/08/2023 1223   CREATININE 0.80 09/08/2023 1223   CALCIUM  9.2 09/08/2023 1222   GFRNONAA >60 09/08/2023 1222    IMAGING past 24 hours MR BRAIN WO CONTRAST Result Date: 09/09/2023 CLINICAL DATA:  Stroke follow-up. EXAM: MRI HEAD WITHOUT CONTRAST TECHNIQUE: Multiplanar, multiecho pulse sequences of the brain and surrounding structures were obtained without intravenous contrast. COMPARISON:  CT head and CTA head and neck 09/08/2023. FINDINGS: Brain: 9 mm focus of restricted diffusion in the left para median pons at the level of the middle cerebellar peduncles. No evidence of intracranial hemorrhage. T2/FLAIR hyperintensity in the periventricular and subcortical white matter. Remote lacunar infarcts in the bilateral basal ganglia. Additional areas likely reflecting prominent perivascular spaces in the inferior aspect of both  basal ganglia. No edema, mass effect, or midline shift. The basilar cisterns are patent. No extra-axial fluid collections. Ventricles: Normal size and configuration of the ventricles. Vascular: Skull base flow voids are visualized. Skull and upper cervical spine: No focal abnormality. Sinuses/Orbits: Orbits are symmetric. Mucous retention cyst in the left maxillary sinus. Other: Mastoid air cells are clear. IMPRESSION: 9 mm focus of acute infarct in the left paramedian pons. Mild chronic microvascular ischemic changes. Remote lacunar infarcts in the bilateral basal ganglia. Electronically Signed   By: Donnice Mania M.D.   On: 09/09/2023 14:08    Vitals:   09/09/23 1156 09/09/23 1200 09/09/23 1300 09/09/23 1400  BP:  122/85 138/80 (!) 140/80  Pulse:  90 92 99  Resp:  (!) 22 16 (!) 24  Temp: 98 F (36.7 C)     TempSrc: Axillary     SpO2:  94% 96% 92%  Weight:      Height:         PHYSICAL EXAM General:  Alert, well-nourished, well-developed patient in no acute distress Psych:  Mood and affect appropriate for situation CV: Regular rate and rhythm on monitor Respiratory:  Regular, unlabored respirations on room air   NEURO:  Mental Status: AA&Ox3, patient is able to give clear and coherent history Speech/Language: speech is without dysarthria or aphasia.   Cranial Nerves:  II: PERRL. Visual fields full.  III, IV, VI: EOMI. Eyelids elevate symmetrically.  V: Sensation is intact to light touch and symmetrical to face.  VII: Face is symmetrical resting and smiling VIII: hearing  intact to voice. IX, X: Phonation is normal.  XII: tongue is midline without fasciculations. Motor: Able to move all 4 extremities with good antigravity strength but continues to have heaviness of right arm and weakness of right leg no drift Tone: is normal and bulk is normal Sensation- Intact to light touch bilaterally.  Coordination: FTN intact bilaterally Gait- deferred  Most Recent NIH  0   ASSESSMENT/PLAN  [name] is a 48 y.o. male with history of CAD status post PCI, hypertension, hyperlipidemia, diabetes, sleep apnea on CPAP and lacunar infarct seen on MRI admitted for acute onset right arm and leg weakness.  Initial head CT was negative, and patient was given TNK to treat presumed stroke.  Symptoms did improve after TNK.  MRI demonstrates left paramedian pontine infarct.  NIH on Admission 2  Acute Ischemic Infarct:  left paramedian pontine infarct s/p TNK Etiology: Small vessel disease Code Stroke CT head No acute abnormality. Small vessel disease. ASPECTS 10.    CTA head & neck no LVO, moderate stenosis of left MCA posterior M3 branch, moderate stenosis of left vertebral V4 segment, moderate stenosis right ACA A2 segment, moderate stenosis right PCA P2 segment MRI left paramedian pontine infarct, multiple chronic infarcts 2D Echo pending LDL 98 HgbA1c 8.1 VTE prophylaxis -SCDs clopidogrel 75 mg daily prior to admission, now on aspirin 81 mg daily and Brilinta 90 mg twice daily for 4 weeks and then Brilinta alone Therapy recommendations:  Pending Disposition: Pending  Hx of Stroke/TIA Patient has multiple chronic lacunar infarcts seen on MRI  Hypertension Home meds: Bisoprolol 5 mg daily, lisinopril  20 mg daily, both prescribed but not currently taking Stable Blood Pressure Goal: BP less than 180/105   Hyperlipidemia Home meds: Rosuvastatin  40 mg daily, prescribed but not taking, resumed in hospital LDL 98, goal < 70 Continue statin at discharge  Diabetes type II poorly controlled Home meds: Tresiba  60 units daily, Jardiance  25 mg daily, dulaglutide  1.5 mg weekly HgbA1c 8.1, goal < 7.0 CBGs SSI Recommend close follow-up with PCP for better DM control  Other Stroke Risk Factors Obesity, Body mass index is 35.43 kg/m., BMI >/= 30 associated with increased stroke risk, recommend weight loss, diet and exercise as appropriate  Coronary artery  disease Obstructive sleep apnea, on CPAP at home   Other Active Problems None  Hospital day # 1  Patient seen by NP with MD, MD to edit note as needed. Cortney E Everitt Clint Kill , MSN, AGACNP-BC [facility] See Amion for schedule and pager information 09/09/2023 3:18 PM  I have personally obtained history,examined this patient, reviewed notes, independently viewed imaging studies, participated in medical decision making and plan of care.ROS completed by me personally and pertinent positives fully documented  I have made any additions or clarifications directly to the above note. Agree with note above.  Patient with multiple vascular risk factors presented with sudden onset of right-sided weakness and received IV TNK and has obtained near complete recovery.  MRI confirms left paramedian pontine lacunar stroke.  Continue strict blood pressure control and close neurological observation as per post TNK protocol.  Mobilize out of bed.  Therapy consults.  Aggressive risk factor modification.  Likely discharge home tomorrow.  Recommend aspirin and Brilinta for 4 weeks followed by Brilinta alone given his significant concomitant coronary artery disease.  Long discussion with patient and wife and answered questions. This patient is critically ill and at significant risk of neurological worsening, death and care requires constant monitoring of  vital signs, hemodynamics,respiratory and cardiac monitoring, extensive review of multiple databases, frequent neurological assessment, discussion with family, other specialists and medical decision making of high complexity.I have made any additions or clarifications directly to the above note.This critical care time does not reflect procedure time, or teaching time or supervisory time of PA/NP/Med Resident etc but could involve care discussion time.  I spent 30 minutes of neurocritical care time  in the care of  this patient.     Eather Popp, MD Medical  Director [facility] Pager: [phone] 09/09/2023 3:29 PM   To contact Stroke Continuity provider, please refer to [email or web address]. After hours, contact General Neurology

## 2023-09-09 NOTE — Telephone Encounter (Signed)
 Copied from CRM [id number]. Topic: Clinical - Medical Advice >> Sep 09, 2023 11:22 AM Drema MATSU wrote: Reason for CRM: Patient wife is requesting a callback from [place] regarding husband. He had a stroke and is at [facility].

## 2023-09-09 NOTE — TOC CM/SW Note (Signed)
 Transition of Care [facility]) - Inpatient Brief Assessment   Patient Details  Name: Tyler Hoffman MRN: [id number] Date of Birth: March 15, 1975  [facility]) CM/SW Contact:    Inocente GORMAN Kindle, LCSW Phone Number: 09/09/2023, 12:31 PM   Clinical Narrative: CSW spoke with patient and spouse at bedside. They report that they reside in [place] (since 2018) and are here visiting. Patient confirmed he does have a PCP there with different providers. Therapy to see when medically appropriate. Please place [facility] consult if needs arise.    Transition of Care Asessment: Insurance and Status: Insurance coverage has been reviewed Patient has primary care physician: Yes Home environment has been reviewed: From home Prior level of function:: Independent Prior/Current Home Services: No current home services Social Drivers of Health Review: SDOH reviewed no interventions necessary Readmission risk has been reviewed: Yes Transition of care needs: no transition of care needs at this time

## 2023-09-09 NOTE — Telephone Encounter (Signed)
 Patient Product/process development scientist completed.    The patient is insured through [employer]. Patient has [employer], may use a copay card, and/or apply for patient assistance if available.    Ran test claim for ticagrelor (Brilinta) 90 mg and the current 30 day co-pay is $10.00.   This test claim was processed through [facility]- copay amounts may vary at other pharmacies due to pharmacy/plan contracts, or as the patient moves through the different stages of their insurance plan.     Reyes Sharps, CPHT Pharmacy Technician III Certified Patient Advocate [facility] Patient Advocate Team Direct Number: [phone]  Fax: [phone]

## 2023-09-09 NOTE — Progress Notes (Signed)
 PT Cancellation Note  Patient Details Name: Tyler Hoffman MRN: [id number] DOB: Aug 22, 1975   Cancelled Treatment:    Reason Eval/Treat Not Completed: Patient at procedure or test/unavailable  Patient leaving for MRI. Will continue efforts to evaluate.    Macario RAMAN, PT Acute Rehabilitation Services  Office [phone]  Macario SHAUNNA Soja 09/09/2023, 1:13 PM

## 2023-09-09 NOTE — TOC CAGE-AID Note (Signed)
 Transition of Care [facility]) - CAGE-AID Screening   Patient Details  Name: Tylar Amborn MRN: [id number] Date of Birth: 25-Jun-1975  Transition of Care [facility]) CM/SW Contact:    Inocente GORMAN Kindle, LCSW Phone Number: 09/09/2023, 12:30 PM   Clinical Narrative: Patient reported occasional ETOH use. Need for resources not indicated.    CAGE-AID Screening:    Have You Ever Felt You Ought to Cut Down on Your Drinking or Drug Use?: No Have People Annoyed You By Critizing Your Drinking Or Drug Use?: No Have You Felt Bad Or Guilty About Your Drinking Or Drug Use?: No Have You Ever Had a Drink or Used Drugs First Thing In The Morning to Steady Your Nerves or to Get Rid of a Hangover?: No CAGE-AID Score: 0  Substance Abuse Education Offered: No

## 2023-09-09 NOTE — Progress Notes (Signed)
 PT Cancellation Note  Patient Details Name: Georgia Baria MRN: [id number] DOB: [birth year]   Cancelled Treatment:    Reason Eval/Treat Not Completed: Active bedrest order  Remains on strict bedrest. Will monitor for increase in activity orders and proceed with evaluation as appropriate.   Macario RAMAN, PT Acute Rehabilitation Services  Office [phone]   Macario SHAUNNA Soja 09/09/2023, 8:46 AM

## 2023-09-09 NOTE — Progress Notes (Signed)
 Attempted Echocardiogram, patient is in physical therapy. Will attempt at a later time.

## 2023-09-09 NOTE — Plan of Care (Signed)
  Problem: Education: Goal: Knowledge of disease or condition will improve Outcome: Progressing Goal: Knowledge of secondary prevention will improve (MUST DOCUMENT ALL) Outcome: Progressing Goal: Knowledge of patient specific risk factors will improve (DELETE if not current risk factor) Outcome: Progressing   Problem: Ischemic Stroke/TIA Tissue Perfusion: Goal: Complications of ischemic stroke/TIA will be minimized Outcome: Progressing   Problem: Coping: Goal: Will verbalize positive feelings about self Outcome: Progressing Goal: Will identify appropriate support needs Outcome: Progressing   Problem: Self-Care: Goal: Ability to participate in self-care as condition permits will improve Outcome: Progressing Goal: Verbalization of feelings and concerns over difficulty with self-care will improve Outcome: Progressing Goal: Ability to communicate needs accurately will improve Outcome: Progressing   Problem: Health Behavior/Discharge Planning: Goal: Ability to manage health-related needs will improve Outcome: Progressing   Problem: Skin Integrity: Goal: Risk for impaired skin integrity will decrease Outcome: Progressing

## 2023-09-09 NOTE — Evaluation (Signed)
 Physical Therapy Evaluation Patient Details Name: Tyler Hoffman MRN: [id number] DOB: 03/19/1975 Today's Date: 09/09/2023  History of Present Illness  48 y.o. male presented 09/08/23 for evaluation of sudden onset of right leg and arm weakness. NIHSS=2; received TNK; bradycardia to 30s; Cardiology consult likely vagal episode;  CT head negative  CTA mod stenosis L MCA post m3, mod stenosis of L vertrbal distal M4 segment    PMH CAD, lacunar infarcts on plavix, CAD, HTN, DM2,  Clinical Impression   Pt admitted secondary to problem above with deficits below. PTA patient lives in [place] in a 3 story home and mobilizes independently without a device.  Pt currently requires CGA and use of RW for ambulation x 80 ft. Berg Balance Assessment= 30/56 demonstrating pt at risk for falls. Patient very motivated and wants to maximize his therapies prior to return to [place] (?later this month as planned?). Anticipate patient will benefit from PT to address problems listed below. Will continue to follow acutely to maximize functional mobility, independence, and safety. Recommend pt could benefit from intensive inpatient therapies >3 hrs/day.          If plan is discharge home, recommend the following: A little help with walking and/or transfers;Assistance with cooking/housework;Assist for transportation;Help with stairs or ramp for entrance   Can travel by private vehicle        Equipment Recommendations Rolling walker (2 wheels)  Recommendations for Other Services  Rehab consult    Functional Status Assessment Patient has had a recent decline in their functional status and demonstrates the ability to make significant improvements in function in a reasonable and predictable amount of time.     Precautions / Restrictions Precautions Precautions: Fall Restrictions Weight Bearing Restrictions Per Provider Order: No      Mobility  Bed Mobility Overal bed mobility: Needs Assistance Bed Mobility: Supine to  Sit, Sit to Supine     Supine to sit: Supervision Sit to supine: Supervision        Transfers Overall transfer level: Needs assistance Equipment used: Rolling walker (2 wheels), None Transfers: Sit to/from Stand Sit to Stand: Contact guard assist, From elevated surface (from ICU bed)           General transfer comment: vc for proper use of RW    Ambulation/Gait Ambulation/Gait assistance: Contact guard assist Gait Distance (Feet): 80 Feet (with RW; 12 no device) Assistive device: Rolling walker (2 wheels), None Gait Pattern/deviations: Step-through pattern, Decreased stride length, Decreased dorsiflexion - right, Decreased weight shift to right   Gait velocity interpretation: 1.31 - 2.62 ft/sec, indicative of limited community ambulator   General Gait Details: Patient reports feeling more secure with use of RW due to RLE heaviness  Stairs            Wheelchair Mobility     Tilt Bed    Modified Rankin (Stroke Patients Only) Modified Rankin (Stroke Patients Only) Pre-Morbid Rankin Score: No symptoms Modified Rankin: Moderately severe disability     Balance                                 Standardized Balance Assessment Standardized Balance Assessment : Berg Balance Test Berg Balance Test Sit to Stand: Needs minimal aid to stand or to stabilize Standing Unsupported: Able to stand 30 seconds unsupported Sitting with Back Unsupported but Feet Supported on Floor or Stool: Able to sit safely and securely 2 minutes Stand to Sit: Sits  safely with minimal use of hands Transfers: Needs one person to assist Standing Unsupported with Eyes Closed: Able to stand 10 seconds with supervision Standing Ubsupported with Feet Together: Able to place feet together independently and stand for 1 minute with supervision From Standing, Reach Forward with Outstretched Arm: Can reach forward >12 cm safely (5) From Standing Position, Pick up Object from Floor:  Able to pick up shoe, needs supervision From Standing Position, Turn to Look Behind Over each Shoulder: Looks behind one side only/other side shows less weight shift Turn 360 Degrees: Needs close supervision or verbal cueing Standing Unsupported, Alternately Place Feet on Step/Stool: Able to complete >2 steps/needs minimal assist Standing Unsupported, One Foot in Front: Needs help to step but can hold 15 seconds Standing on One Leg: Unable to try or needs assist to prevent fall Total Score: 30         Pertinent Vitals/Pain Pain Assessment Pain Assessment: No/denies pain    Home Living Family/patient expects to be discharged to:: Private residence Living Arrangements: Spouse/significant other Available Help at Discharge: Family Type of Home: House Home Access: Ramped entrance       Home Layout: One level Home Equipment: None Additional Comments: In [place] has 3 floors, no steps to enter, walk in shower    Prior Function Prior Level of Function : Independent/Modified Independent;Working/employed;Driving             Mobility Comments: no AD use ADLs Comments: ind     Extremity/Trunk Assessment   Upper Extremity Assessment Upper Extremity Assessment: Defer to OT evaluation    Lower Extremity Assessment Lower Extremity Assessment: RLE deficits/detail RLE Deficits / Details: pt describes feeling of heaviness; 4+/5 throughout RLE Sensation: WNL RLE Coordination: decreased fine motor;decreased gross motor (slight dyscoordination toe tapping and with placing activities with RLE)    Cervical / Trunk Assessment Cervical / Trunk Assessment: Normal  Communication   Communication Communication: No apparent difficulties    Cognition Arousal: Alert Behavior During Therapy: WFL for tasks assessed/performed   PT - Cognitive impairments: No apparent impairments                         Following commands: Intact       Cueing Cueing Techniques: Verbal cues      General Comments      Exercises     Assessment/Plan    PT Assessment Patient needs continued PT services  PT Problem List Decreased strength;Decreased balance;Decreased mobility;Decreased coordination;Decreased knowledge of use of DME;Impaired sensation;Obesity       PT Treatment Interventions DME instruction;Gait training;Stair training;Functional mobility training;Therapeutic activities;Therapeutic exercise;Balance training;Neuromuscular re-education;Patient/family education    PT Goals (Current goals can be found in the Care Plan section)  Acute Rehab PT Goals Patient Stated Goal: to return to [place] when medically appropriate PT Goal Formulation: With patient Time For Goal Achievement: 09/23/23 Potential to Achieve Goals: Good    Frequency Min 3X/week     Co-evaluation               AM-PAC PT 6 Clicks Mobility  Outcome Measure Help needed turning from your back to your side while in a flat bed without using bedrails?: None Help needed moving from lying on your back to sitting on the side of a flat bed without using bedrails?: A Little Help needed moving to and from a bed to a chair (including a wheelchair)?: A Little Help needed standing up from a chair using your arms (e.g.,  wheelchair or bedside chair)?: A Little Help needed to walk in hospital room?: A Little Help needed climbing 3-5 steps with a railing? : A Little 6 Click Score: 19    End of Session Equipment Utilized During Treatment: Gait belt Activity Tolerance: Patient tolerated treatment well Patient left: in bed;with call bell/phone within reach;with family/visitor present   PT Visit Diagnosis: Other abnormalities of gait and mobility (R26.89)    Time: 8487-8470 PT Time Calculation (min) (ACUTE ONLY): 17 min   Charges:   PT Evaluation $PT Eval Low Complexity: 1 Low   PT General Charges $$ ACUTE PT VISIT: 1 Visit          Macario RAMAN, PT Acute Rehabilitation Services  Office  [phone]   Macario SHAUNNA Soja 09/09/2023, 4:09 PM

## 2023-09-09 NOTE — Evaluation (Signed)
 Occupational Therapy Evaluation Patient Details Name: Tyler Hoffman MRN: [id number] DOB: [birth year] Today's Date: 09/09/2023   History of Present Illness   48 y.o. male presented 09/08/23 for evaluation of sudden onset of right leg and arm weakness. NIHSS=2; received TNK; bradycardia to 30s; Cardiology consult likely vagal episode;  CT head negative  CTA mod stenosis L MCA post m3, mod stenosis of L vertrbal distal M4 segment    PMH CAD, lacunar infarcts on plavix, CAD, HTN, DM2,     Clinical Impressions Pt ind at baseline, [place] visiting the [place]  from his home in [place]. Pt currently needing up to CGA for ADLs, supervision for bed mobility and CGA for transfers with RW. Pt with decr coordination in RUE ,pt is RHD. Also pt reports brain fog, cognition not formally assessed this session but appears [facility] for BADL tasks. Pt presenting with impairments listed below, will follow acutely. Patient will benefit from intensive inpatient follow-up therapy, >3 hours/day to maximize safety/ind with ADL/functional mobility.      If plan is discharge home, recommend the following:   A little help with walking and/or transfers;A little help with bathing/dressing/bathroom;Assistance with cooking/housework;Assist for transportation     Functional Status Assessment   Patient has had a recent decline in their functional status and demonstrates the ability to make significant improvements in function in a reasonable and predictable amount of time.     Equipment Recommendations   None recommended by OT     Recommendations for Other Services   PT consult;Rehab consult     Precautions/Restrictions   Precautions Precautions: Fall Restrictions Weight Bearing Restrictions Per Provider Order: No     Mobility Bed Mobility Overal bed mobility: Needs Assistance Bed Mobility: Supine to Sit, Sit to Supine     Supine to sit: Supervision Sit to supine: Supervision        Transfers Overall  transfer level: Needs assistance Equipment used: Rolling walker (2 wheels), None Transfers: Sit to/from Stand Sit to Stand: Contact guard assist                  Balance                                           ADL either performed or assessed with clinical judgement   ADL Overall ADL's : Needs assistance/impaired Eating/Feeding: Set up;Sitting   Grooming: Set up;Sitting   Upper Body Bathing: Contact guard assist;Sitting   Lower Body Bathing: Contact guard assist;Sitting/lateral leans   Upper Body Dressing : Contact guard assist;Sitting   Lower Body Dressing: Contact guard assist;Sitting/lateral leans   Toilet Transfer: Minimal assistance;Ambulation;Regular Toilet;Rolling walker (2 wheels)   Toileting- Clothing Manipulation and Hygiene: Contact guard assist       Functional mobility during ADLs: Minimal assistance;Rolling walker (2 wheels)       Vision Baseline Vision/History: 1 Wears glasses Vision Assessment?: No apparent visual deficits     Perception Perception: Not tested       Praxis Praxis: Not tested       Pertinent Vitals/Pain Pain Assessment Pain Assessment: No/denies pain     Extremity/Trunk Assessment Upper Extremity Assessment Upper Extremity Assessment: Defer to OT evaluation RUE Deficits / Details: 4/5 globally, impaired [facility] RUE Coordination: decreased fine motor   Lower Extremity Assessment Lower Extremity Assessment: Defer to PT evaluation RLE Deficits / Details: pt describes feeling of heaviness; 4+/5 throughout RLE  Sensation: WNL RLE Coordination: decreased fine motor;decreased gross motor (slight dyscoordination toe tapping and with placing activities with RLE)   Cervical / Trunk Assessment Cervical / Trunk Assessment: Normal   Communication Communication Communication: No apparent difficulties   Cognition Arousal: Alert Behavior During Therapy: WFL for tasks assessed/performed Cognition: No apparent  impairments             OT - Cognition Comments: not formally assessed, WFL for BADL, reports some brain fog                 Following commands: Intact       Cueing  General Comments   Cueing Techniques: Verbal cues  VSS on RA   Exercises     Shoulder Instructions      Home Living Family/patient expects to be discharged to:: Private residence Living Arrangements: Spouse/significant other Available Help at Discharge: Family Type of Home: House Home Access: Ramped entrance     Home Layout: One level     Bathroom Shower/Tub: Walk-in shower         Home Equipment: None   Additional Comments: In [place] has 3 floors, no steps to enter, walk in shower      Prior Functioning/Environment Prior Level of Function : Independent/Modified Independent;Working/employed;Driving             Mobility Comments: no AD use ADLs Comments: ind    OT Problem List: Decreased strength;Decreased range of motion;Decreased activity tolerance;Impaired balance (sitting and/or standing);Impaired UE functional use   OT Treatment/Interventions: Self-care/ADL training;Therapeutic exercise;Energy conservation;DME and/or AE instruction;Therapeutic activities;Balance training;Patient/family education;Cognitive remediation/compensation;Neuromuscular education;Visual/perceptual remediation/compensation      OT Goals(Current goals can be found in the care plan section)   Acute Rehab OT Goals Patient Stated Goal: none state OT Goal Formulation: With patient/family Time For Goal Achievement: 09/23/23 Potential to Achieve Goals: Good ADL Goals Pt/caregiver will Perform Home Exercise Program: Increased ROM;Increased strength;Right Upper extremity;With Supervision Additional ADL Goal #1: pt will recieve passing score on pillbox assessment in order to promote ind with IADL Additional ADL Goal #2: Pt will tolerate OOB standing functional activity x15 min in order to improve standing  balance for ADLs   OT Frequency:  Min 2X/week    Co-evaluation              AM-PAC OT 6 Clicks Daily Activity     Outcome Measure Help from another person eating meals?: A Little Help from another person taking care of personal grooming?: A Little Help from another person toileting, which includes using toliet, bedpan, or urinal?: A Little Help from another person bathing (including washing, rinsing, drying)?: A Little Help from another person to put on and taking off regular upper body clothing?: A Little Help from another person to put on and taking off regular lower body clothing?: A Little 6 Click Score: 18   End of Session Equipment Utilized During Treatment: Rolling walker (2 wheels);Gait belt Nurse Communication: Mobility status  Activity Tolerance: Patient tolerated treatment well Patient left: in bed;with call bell/phone within reach;with family/visitor present (transport in room to take pt to MRI)  OT Visit Diagnosis: Unsteadiness on feet (R26.81);Other abnormalities of gait and mobility (R26.89);Muscle weakness (generalized) (M62.81)                Time: 1249-1310 OT Time Calculation (min): 21 min Charges:  OT General Charges $OT Visit: 1 Visit OT Evaluation $OT Eval Low Complexity: 1 Low  Florence Antonelli K, OTD, OTR/L SecureChat Preferred Acute Rehab [phone]   Maleah Rabago K Koonce 09/09/2023, 4:48 PM

## 2023-09-10 ENCOUNTER — Other Ambulatory Visit (HOSPITAL_COMMUNITY): Payer: Self-pay

## 2023-09-10 ENCOUNTER — Encounter: Admitting: Family Medicine

## 2023-09-10 ENCOUNTER — Telehealth (HOSPITAL_COMMUNITY): Payer: Self-pay | Admitting: Pharmacy Technician

## 2023-09-10 ENCOUNTER — Telehealth: Payer: Self-pay | Admitting: Emergency Medicine

## 2023-09-10 DIAGNOSIS — I6381 Other cerebral infarction due to occlusion or stenosis of small artery: Secondary | ICD-10-CM | POA: Diagnosis not present

## 2023-09-10 DIAGNOSIS — E785 Hyperlipidemia, unspecified: Secondary | ICD-10-CM | POA: Diagnosis not present

## 2023-09-10 DIAGNOSIS — R001 Bradycardia, unspecified: Secondary | ICD-10-CM

## 2023-09-10 DIAGNOSIS — I2510 Atherosclerotic heart disease of native coronary artery without angina pectoris: Secondary | ICD-10-CM

## 2023-09-10 DIAGNOSIS — R29700 NIHSS score 0: Secondary | ICD-10-CM | POA: Diagnosis not present

## 2023-09-10 DIAGNOSIS — I10 Essential (primary) hypertension: Secondary | ICD-10-CM | POA: Diagnosis not present

## 2023-09-10 LAB — GLUCOSE, CAPILLARY
Glucose-Capillary: 144 mg/dL — ABNORMAL HIGH (ref 70–99)
Glucose-Capillary: 100 mg/dL — ABNORMAL HIGH (ref 70–99)
Glucose-Capillary: 125 mg/dL — ABNORMAL HIGH (ref 70–99)
Glucose-Capillary: 144 mg/dL — ABNORMAL HIGH (ref 70–99)

## 2023-09-10 LAB — BASIC METABOLIC PANEL WITH GFR
Anion gap: 9 (ref 5–15)
BUN: 22 mg/dL — ABNORMAL HIGH (ref 6–20)
CO2: 22 mmol/L (ref 22–32)
Calcium: 8.7 mg/dL — ABNORMAL LOW (ref 8.9–10.3)
Chloride: 106 mmol/L (ref 98–111)
Creatinine, Ser: 0.81 mg/dL (ref 0.61–1.24)
GFR, Estimated: >60 mL/min (ref >60)
Glucose, Bld: 136 mg/dL — ABNORMAL HIGH (ref 70–99)
Potassium: 4 mmol/L (ref 3.5–5.1)
Sodium: 137 mmol/L (ref 135–145)

## 2023-09-10 LAB — CBC
HCT: 46.5 % (ref 39.0–52.0)
Hemoglobin: 15.8 g/dL (ref 13.0–17.0)
MCH: 32.2 pg (ref 26.0–34.0)
MCHC: 34 g/dL (ref 30.0–36.0)
MCV: 94.9 fL (ref 80.0–100.0)
Platelets: 231 10*3/uL (ref 150–400)
RBC: 4.9 MIL/uL (ref 4.22–5.81)
RDW: 12.5 % (ref 11.5–15.5)
WBC: 8.2 10*3/uL (ref 4.0–10.5)
nRBC: 0 % (ref 0.0–0.2)

## 2023-09-10 MED ORDER — BISOPROLOL FUMARATE 5 MG PO TABS
2.5000 mg | ORAL_TABLET | Freq: Every day | ORAL | Status: DC
  Administered 2023-09-10 – 2023-09-11 (×2): 2.5 mg via ORAL
  Filled 2023-09-10 (×2): qty 0.5

## 2023-09-10 MED ORDER — EMPAGLIFLOZIN 25 MG PO TABS
25.0000 mg | ORAL_TABLET | Freq: Every day | ORAL | Status: DC
  Administered 2023-09-10 – 2023-09-11 (×2): 25 mg via ORAL
  Filled 2023-09-10 (×2): qty 1

## 2023-09-10 NOTE — Progress Notes (Signed)
 Physical Therapy Treatment Patient Details Name: Tyler Hoffman MRN: [id number] DOB: [birth year] Today's Date: 09/10/2023   History of Present Illness 48 y.o. male presented 09/08/23 for evaluation of sudden onset of right leg and arm weakness. NIHSS=2; received TNK; bradycardia to 30s; Cardiology consult likely vagal episode;  CT head negative  CTA mod stenosis L MCA post m3, mod stenosis of L vertrbal distal M4 segment    PMH CAD, lacunar infarcts on plavix, CAD, HTN, DM2,    PT Comments  Patient remains highly motivated and has been doing LE exercises every hour. Denies issue with overfatiguing. Reviewed ankle pumps, bridging, and RLE coordination exercises and noted improvement with RLE for each. Repeated sit to stand x 6 reps without use of UEs challenging for pt with compensations noted. Able to correct compensations and complete with min assist progressing to CGA. Ambulation with RW as pt felt insecure with RLE fatigue. Continued decr wt-shift over RLE with decr stance time on RLE. Discussed pending AIR physician consult.    If plan is discharge home, recommend the following: A little help with walking and/or transfers;Assistance with cooking/housework;Assist for transportation;Help with stairs or ramp for entrance   Can travel by private vehicle        Equipment Recommendations  Rolling walker (2 wheels)    Recommendations for Other Services       Precautions / Restrictions Precautions Precautions: Fall Restrictions Weight Bearing Restrictions Per Provider Order: No     Mobility  Bed Mobility Overal bed mobility: Needs Assistance Bed Mobility: Supine to Sit     Supine to sit: Supervision     General bed mobility comments: incr time, effort    Transfers Overall transfer level: Needs assistance Equipment used: Rolling walker (2 wheels), None Transfers: Sit to/from Stand Sit to Stand: Contact guard assist           General transfer comment: vc for proper use of RW;  without RW and no UE use near need for assist; initially pushing backs of legs against recliner to leverage himself up; with cues able to progress to no posterior leg support agains chair; attempted with staggered stance with rt foot closer and pt required min assist initially progressing to CGA    Ambulation/Gait Ambulation/Gait assistance: Contact guard assist Gait Distance (Feet): 100 Feet Assistive device: Rolling walker (2 wheels) Gait Pattern/deviations: Step-through pattern, Decreased stride length, Decreased dorsiflexion - right, Decreased weight shift to right       General Gait Details: RLE fatigued after exercises and pt did not feel safe attempting ambulation without a device   Stairs             Wheelchair Mobility     Tilt Bed    Modified Rankin (Stroke Patients Only) Modified Rankin (Stroke Patients Only) Pre-Morbid Rankin Score: No symptoms Modified Rankin: Moderately severe disability     Balance Overall balance assessment: Needs assistance         Standing balance support: No upper extremity supported Standing balance-Leahy Scale: Fair                              Hotel manager: No apparent difficulties  Cognition Arousal: Alert Behavior During Therapy: WFL for tasks assessed/performed   PT - Cognitive impairments: No apparent impairments                         Following commands: Intact  Cueing Cueing Techniques: Verbal cues  Exercises Low Level/ICU Exercises Ankle Circles/Pumps: AROM, Both, 10 reps Stabilized Bridging: AROM, Strengthening, Both, Right, 5 reps (initially bil LE progressing to RLE only) Other Exercises Other Exercises: RLE coordination exercises with noted improvement Other Exercises: sit to stand no UE support x 6 reps (feet parallel vs rt foot pulled back closer)    General Comments        Pertinent Vitals/Pain Pain Assessment Pain Assessment: No/denies  pain    Home Living                          Prior Function            PT Goals (current goals can now be found in the care plan section) Acute Rehab PT Goals Patient Stated Goal: to return to [place] when medically appropriate Time For Goal Achievement: 09/23/23 Potential to Achieve Goals: Good Progress towards PT goals: Progressing toward goals    Frequency    Min 3X/week      PT Plan      Co-evaluation              AM-PAC PT 6 Clicks Mobility   Outcome Measure  Help needed turning from your back to your side while in a flat bed without using bedrails?: None Help needed moving from lying on your back to sitting on the side of a flat bed without using bedrails?: A Little Help needed moving to and from a bed to a chair (including a wheelchair)?: A Little Help needed standing up from a chair using your arms (e.g., wheelchair or bedside chair)?: A Little Help needed to walk in hospital room?: A Little Help needed climbing 3-5 steps with a railing? : A Little 6 Click Score: 19    End of Session Equipment Utilized During Treatment: Gait belt Activity Tolerance: Patient tolerated treatment well Patient left: with call bell/phone within reach;with family/visitor present;in chair Nurse Communication: Mobility status PT Visit Diagnosis: Other abnormalities of gait and mobility (R26.89)     Time: 8971-8948 PT Time Calculation (min) (ACUTE ONLY): 23 min  Charges:    $Gait Training: 8-22 mins $Therapeutic Exercise: 8-22 mins PT General Charges $$ ACUTE PT VISIT: 1 Visit                      Macario RAMAN, PT Acute Rehabilitation Services  Office [phone]    Macario SHAUNNA Soja 09/10/2023, 11:12 AM

## 2023-09-10 NOTE — Telephone Encounter (Signed)
 Noted

## 2023-09-10 NOTE — Inpatient Diabetes Management (Signed)
 Inpatient Diabetes Program Recommendations  AACE/ADA: New Consensus Statement on Inpatient Glycemic Control (2015)  Target Ranges:  Prepandial:   less than 140 mg/dL      Peak postprandial:   less than 180 mg/dL (1-2 hours)      Critically ill patients:  140 - 180 mg/dL   Lab Results  Component Value Date   GLUCAP 100 (H) 09/10/2023   HGBA1C 8.1 (H) 08/30/2023    Review of Glycemic Control  Latest Reference Range & Units 09/08/23 15:56 09/09/23 11:39 09/09/23 16:41 09/09/23 22:03 09/10/23 07:43 09/10/23 11:38  Glucose-Capillary 70 - 99 mg/dL 898 (H) 878 (H) 869 (H) 164 (H) 144 (H) 100 (H)  (H): Data is abnormally high  Diabetes history: DM2 Outpatient Diabetes medications: Tresiba  60 units every day, Trulicity  1.5 mg weekly, Jardiance  25 mg QD Current orders for Inpatient glycemic control: Novolog 0-15 units TID and Jardiance  25 mg QD  Referral for diet education, CGM use and limited f/u as he lives in [place].    Met with patient, spouse and close friends at bedside.  He confirms above home DM medications.  Reviewed patient's current A1c of 8.1%. Explained what a A1c is and what it measures. Also reviewed goal A1c with patient, importance of good glucose control @ home, and blood sugar goals.  He states due to universal healthcare in [place], sometimes he cannot obtain his Trulicity  because the medications are dispersed to the [place] citizen's first.  When this happens he usually increases his Tresiba  dose.  He wears a [employer] 3 and pays $75/mo using the built in manufacturer coupon.  He has asked me to see if his insurance would cover the CGM.  Asked our [facility] pharmacy for a benefit check.  It does require a PA; [facility] pharmacy is starting this.    His wife would like to be able to follow his glucose trends on her phone as well.  I showed them how to start sharing his BG on the app using the Freestyle Link Up.  Explained if his Freestyle should stop working or fall off he can call the  [employer] 1-800 number on the side of the box and request a replacement for no charge.    Educated on The Plate Method, CHO's, portion control, avoiding caloric beverages, CBGs at home fasting and mid afternoon, F/U with PCP every 3 months, bring meter to PCP office, long and short term complications of uncontrolled BG, and importance of exercise.  He does have an endocrinologist in [place] that he sees every 6 months to 1 year.  Encouraged him to follow up every 3 months.  Patient tells me he has been spiking up into the 300's after eating lately.  Asked him to discuss basal/bolus (carb coverage and correction with rapid insulin ) with his endocrinologist.    All questions answered at this time.    Thank you, Wyvonna Pinal, MSN, CDCES Diabetes Coordinator Inpatient Diabetes Program [phone] (team pager from 8a-5p)

## 2023-09-10 NOTE — Telephone Encounter (Signed)
 Looks like we have you moved to 8/10 @ 9AM.  [facility]

## 2023-09-10 NOTE — Telephone Encounter (Addendum)
 Patient Product/process development scientist completed.    The patient is insured through [employer]. Patient has [employer], may use a copay card, and/or apply for patient assistance if available.    Ran test claim for [employer] 3 Plus Sensor and Requires Prior [employer] will not above PA since patient has not had insulin  filled on his insurance  Ran test claim for Tresiba  and 30 day Supply is $100.00  Ran test claim for Humalog Kwikpen and 30 day Supply is $25.00  This test claim was processed through [employer]- copay amounts may vary at other pharmacies due to [employer], or as the patient moves through the different stages of their insurance plan.     Reyes Sharps, CPHT Pharmacy Technician III Certified Patient Advocate [facility] Patient Advocate Team Direct Number: [phone]  Fax: [phone]

## 2023-09-10 NOTE — Progress Notes (Signed)
 Inpatient Rehab Coordinator Note:  I met with patient and his friend at bedside to discuss CIR recommendations and goals/expectations of CIR stay.  We reviewed 3 hrs/day of therapy, physician follow up, and estimated LOS of 7 days or less, with goals of independence.  Pt is in agreement to pursue, feels like his UE strength/fine motor and balance remains a challenge.  Note 30/56 on BERG indicating high fall risk.  I reviewed [employer] prior auth process and I will start that request today.   Reche Lowers, PT, DPT Admissions Coordinator [phone] 09/10/23  11:39 AM

## 2023-09-10 NOTE — Discharge Summary (Shared)
 Stroke Discharge Summary  Patient ID: Tyler Hoffman   MRN: [id number]      DOB: Nov 07, 1975  Date of Admission: 09/08/2023 Date of Discharge: 09/11/2023  Attending Physician:  Stroke, Md, MD Consultant(s):   Treatment Team:  Lbcardiology, Rounding, MD cardiology  Patient's PCP:  Avelina Greig BRAVO, MD  DISCHARGE PRIMARY DIAGNOSIS:  Acute Ischemic Infarct:  left paramedian pontine infarct s/p TNK Etiology: Small vessel disease  Patient Active Problem List   Diagnosis Date Noted   Bradycardia 09/10/2023   Acute ischemic stroke (HCC) 09/08/2023   Coronary artery disease involving native coronary artery of native heart without angina pectoris 07/19/2020   Lacunar infarction (HCC) 04/27/2016   Hypertension associated with diabetes (HCC) 04/29/2014   Family history of premature CAD 04/29/2014   Obesity (BMI 30-39.9) 10/16/2013   OSA (obstructive sleep apnea) 11/05/2011   Diabetes mellitus with circulatory complication, with long-term current use of insulin  (HCC) 04/27/2009   CONJUNCTIVITIS, ALLERGIC 06/04/2008   Hyperlipidemia associated with type 2 diabetes mellitus (HCC) 08/22/2006     Allergies as of 09/11/2023       Reactions   Metformin Diarrhea   Metformin And Related Diarrhea   Pseudoephedrine Hypertension   Sulfa Antibiotics Diarrhea   Septra [sulfamethoxazole-trimethoprim] Diarrhea, Swelling, Rash   Sulfamethoxazole-trimethoprim Swelling, Rash   Red all over, also   Sulfonamide Derivatives Swelling, Rash        Medication List     STOP taking these medications    lisinopril  20 MG tablet Commonly known as: ZESTRIL    Plavix 75 MG tablet Generic drug: clopidogrel   tadalafil 5 MG tablet Commonly known as: CIALIS       TAKE these medications    aspirin EC 81 MG tablet Take 1 tablet (81 mg total) by mouth daily. Swallow whole. Start taking on: September 12, 2023   Bisoprolol Fumarate 2.5 MG Tabs Take 2.5 mg by mouth daily. Start taking on: September 12, 2023 What  changed: medication strength   empagliflozin  25 MG Tabs tablet Commonly known as: JARDIANCE  Take 1 tablet (25 mg total) by mouth daily. Start taking on: September 12, 2023   ezetimibe  10 MG tablet Commonly known as: ZETIA  Take 1 tablet (10 mg total) by mouth daily.   glucose blood test strip Commonly known as: OneTouch Verio Use to check blood sugar 4 times a day.  Dx: E11.65.   insulin  aspart 100 UNIT/ML injection Commonly known as: novoLOG Inject 0-15 Units into the skin 3 (three) times daily with meals.   Pen Needles 30G X 8 MM Misc Inject 10 Units into the skin daily. Pen needles for Levemir  Flex Pen   rosuvastatin  40 MG tablet Commonly known as: CRESTOR  Take 1 tablet (40 mg total) by mouth daily. What changed: when to take this   ticagrelor 90 MG Tabs tablet Commonly known as: BRILINTA Take 1 tablet (90 mg total) by mouth 2 (two) times daily.   Tresiba  FlexTouch 100 UNIT/ML FlexTouch Pen Generic drug: insulin  degludec INJECT 60 UNITS INTO THE SKIN DAILY What changed: when to take this   Trulicity  1.5 MG/0.5ML Soaj Generic drug: Dulaglutide  Inject 1.5 mg into the skin once a week.        LABORATORY STUDIES CBC    Component Value Date/Time   WBC 8.2 09/10/2023 0609   RBC 4.90 09/10/2023 0609   HGB 15.8 09/10/2023 0609   HCT 46.5 09/10/2023 0609   PLT 231 09/10/2023 0609   MCV 94.9 09/10/2023 0609  MCH 32.2 09/10/2023 0609   MCHC 34.0 09/10/2023 0609   RDW 12.5 09/10/2023 0609   LYMPHSABS 2.4 09/08/2023 1222   MONOABS 0.4 09/08/2023 1222   EOSABS 0.1 09/08/2023 1222   BASOSABS 0.0 09/08/2023 1222   CMP    Component Value Date/Time   NA 137 09/10/2023 0609   K 4.0 09/10/2023 0609   CL 106 09/10/2023 0609   CO2 22 09/10/2023 0609   GLUCOSE 136 (H) 09/10/2023 0609   BUN 22 (H) 09/10/2023 0609   CREATININE 0.81 09/10/2023 0609   CALCIUM  8.7 (L) 09/10/2023 0609   PROT 6.9 09/08/2023 1222   ALBUMIN 4.2 09/08/2023 1222   AST 27 09/08/2023 1222   ALT  35 09/08/2023 1222   ALKPHOS 60 09/08/2023 1222   BILITOT 1.0 09/08/2023 1222   GFRNONAA >60 09/10/2023 0609   GFRAA >60 10/04/2019 2207   COAGS Lab Results  Component Value Date   INR 1.0 09/08/2023   INR 1.04 04/26/2016   Lipid Panel    Component Value Date/Time   CHOL 171 09/09/2023 0724   TRIG 155 (H) 09/09/2023 0724   HDL 42 09/09/2023 0724   CHOLHDL 4.1 09/09/2023 0724   VLDL 31 09/09/2023 0724   LDLCALC 98 09/09/2023 0724   HgbA1C  Lab Results  Component Value Date   HGBA1C 8.1 (H) 08/30/2023   Alcohol Level    Component Value Date/Time   [facility] <15 09/08/2023 1222     SIGNIFICANT DIAGNOSTIC STUDIES ECHOCARDIOGRAM COMPLETE Result Date: 09/09/2023    ECHOCARDIOGRAM REPORT   Patient Name:   Tyler Hoffman Date of Exam: 09/09/2023 Medical Rec #:  [id number]   Height:       63.0 in Accession #:    [id number]  Weight:       200.0 lb Date of Birth:  October 23, 1975    BSA:          1.934 m Patient Age:    48 years    BP:           140/80 mmHg Patient Gender: M           HR:           77 bpm. Exam Location:  Inpatient Procedure: 2D Echo, Cardiac Doppler and Color Doppler (Both Spectral and Color            Flow Doppler were utilized during procedure). Indications:    Stroke I63.9  History:        Patient has prior history of Echocardiogram examinations, most                 recent 06/07/2016. CAD, Stroke; Risk Factors:Hypertension,                 Diabetes, Dyslipidemia and Sleep Apnea.  Sonographer:    Thea Norlander RCS Referring Phys: [id number] ASHISH ARORA IMPRESSIONS  1. Left ventricular ejection fraction, by estimation, is 60 to 65%. The left ventricle has normal function. The left ventricle has no regional wall motion abnormalities. Left ventricular diastolic parameters were normal.  2. Right ventricular systolic function is normal. The right ventricular size is normal.  3. The mitral valve is normal in structure. No evidence of mitral valve regurgitation. No evidence of mitral stenosis.   4. The aortic valve is normal in structure. There is mild calcification of the aortic valve. Aortic valve regurgitation is not visualized. No aortic stenosis is present.  5. The inferior vena cava is normal in size with greater than 50%  respiratory variability, suggesting right atrial pressure of 3 mmHg. FINDINGS  Left Ventricle: Left ventricular ejection fraction, by estimation, is 60 to 65%. The left ventricle has normal function. The left ventricle has no regional wall motion abnormalities. The left ventricular internal cavity size was normal in size. There is  no left ventricular hypertrophy. Left ventricular diastolic parameters were normal. Right Ventricle: The right ventricular size is normal. No increase in right ventricular wall thickness. Right ventricular systolic function is normal. Left Atrium: Left atrial size was normal in size. Right Atrium: Right atrial size was normal in size. Pericardium: There is no evidence of pericardial effusion. Mitral Valve: The mitral valve is normal in structure. No evidence of mitral valve regurgitation. No evidence of mitral valve stenosis. Tricuspid Valve: The tricuspid valve is normal in structure. Tricuspid valve regurgitation is not demonstrated. No evidence of tricuspid stenosis. Aortic Valve: The aortic valve is normal in structure. There is mild calcification of the aortic valve. Aortic valve regurgitation is not visualized. No aortic stenosis is present. Aortic valve peak gradient measures 4.9 mmHg. Pulmonic Valve: The pulmonic valve was normal in structure. Pulmonic valve regurgitation is not visualized. No evidence of pulmonic stenosis. Aorta: The aortic root is normal in size and structure. Venous: The inferior vena cava is normal in size with greater than 50% respiratory variability, suggesting right atrial pressure of 3 mmHg. IAS/Shunts: No atrial level shunt detected by color flow Doppler.  LEFT VENTRICLE PLAX 2D LVIDd:         4.60 cm   Diastology LVIDs:          3.10 cm   LV e' medial:    12.60 cm/s LV PW:         0.90 cm   LV E/e' medial:  5.5 LV IVS:        0.90 cm   LV e' lateral:   13.20 cm/s LVOT diam:     2.20 cm   LV E/e' lateral: 5.2 LV SV:         63 LV SV Index:   33 LVOT Area:     3.80 cm  RIGHT VENTRICLE RV S prime:     14.50 cm/s TAPSE (M-mode): 2.3 cm LEFT ATRIUM             Index        RIGHT ATRIUM           Index LA diam:        3.80 cm 1.97 cm/m   RA Area:     17.00 cm LA Vol (A2C):   33.0 ml 17.09 ml/m  RA Volume:   45.70 ml  23.63 ml/m LA Vol (A4C):   55.2 ml 28.55 ml/m LA Biplane Vol: 46.6 ml 24.10 ml/m  AORTIC VALVE AV Area (Vmax): 2.92 cm AV Vmax:        111.00 cm/s AV Peak Grad:   4.9 mmHg LVOT Vmax:      85.40 cm/s LVOT Vmean:     55.000 cm/s LVOT VTI:       0.166 m  AORTA Ao Root diam: 3.20 cm Ao Asc diam:  2.90 cm MITRAL VALVE MV Area (PHT): 3.85 cm    SHUNTS MV Decel Time: 197 msec    Systemic VTI:  0.17 m MV E velocity: 68.70 cm/s  Systemic Diam: 2.20 cm MV A velocity: 69.70 cm/s MV E/A ratio:  0.99 Aditya Sabharwal Electronically signed by Ria Commander Signature Date/Time: 09/09/2023/5:42:03 PM    Final  MR BRAIN WO CONTRAST Result Date: 09/09/2023 CLINICAL DATA:  Stroke follow-up. EXAM: MRI HEAD WITHOUT CONTRAST TECHNIQUE: Multiplanar, multiecho pulse sequences of the brain and surrounding structures were obtained without intravenous contrast. COMPARISON:  CT head and CTA head and neck 09/08/2023. FINDINGS: Brain: 9 mm focus of restricted diffusion in the left para median pons at the level of the middle cerebellar peduncles. No evidence of intracranial hemorrhage. T2/FLAIR hyperintensity in the periventricular and subcortical white matter. Remote lacunar infarcts in the bilateral basal ganglia. Additional areas likely reflecting prominent perivascular spaces in the inferior aspect of both basal ganglia. No edema, mass effect, or midline shift. The basilar cisterns are patent. No extra-axial fluid collections. Ventricles:  Normal size and configuration of the ventricles. Vascular: Skull base flow voids are visualized. Skull and upper cervical spine: No focal abnormality. Sinuses/Orbits: Orbits are symmetric. Mucous retention cyst in the left maxillary sinus. Other: Mastoid air cells are clear. IMPRESSION: 9 mm focus of acute infarct in the left paramedian pons. Mild chronic microvascular ischemic changes. Remote lacunar infarcts in the bilateral basal ganglia. Electronically Signed   By: Donnice Mania M.D.   On: 09/09/2023 14:08   CT ANGIO HEAD NECK W WO CM (CODE STROKE) Result Date: 09/08/2023 CLINICAL DATA:  48 year old male code stroke presentation. Right side weakness. EXAM: CT ANGIOGRAPHY HEAD AND NECK TECHNIQUE: Multidetector CT imaging of the head and neck was performed using the standard protocol during bolus administration of intravenous contrast. Multiplanar CT image reconstructions and MIPs were obtained to evaluate the vascular anatomy. Carotid stenosis measurements (when applicable) are obtained utilizing NASCET criteria, using the distal internal carotid diameter as the denominator. RADIATION DOSE REDUCTION: This exam was performed according to the departmental dose-optimization program which includes automated exposure control, adjustment of the mA and/or kV according to patient size and/or use of iterative reconstruction technique. CONTRAST:  75mL OMNIPAQUE  IOHEXOL  350 MG/ML SOLN COMPARISON:  Head CT 1227 hours today. FINDINGS: CTA NECK Skeleton: No acute osseous abnormality identified. Upper chest: Negative. Other neck: Glottis is partially closed. Nonvascular neck soft tissue spaces are within normal limits. Aortic arch: 3 vessel arch.  Minimal calcified arch atherosclerosis. Right carotid system: Patent to the skull base with no significant plaque. No stenosis. Left carotid system: Patent with no significant plaque. No stenosis. Vertebral arteries: Minimal plaque at the right subclavian artery origin. Normal  right vertebral artery origin. Patent right vertebral artery to the skull base with no plaque, no stenosis. Normal proximal left subclavian artery and left vertebral artery origin. Codominant left vertebral artery is patent to the skull base with no plaque or stenosis. CTA HEAD Posterior circulation: Codominant distal vertebral arteries. Left V4 segment moderate calcified plaque, and moderate stenosis distal to the left PICA origin which remains patent (series 12, image 91). Right V4 segment is patent to the vertebrobasilar junction with mild stenosis. Right PICA origin is patent. Patent basilar artery with mild irregularity throughout its course. The basilar is diminutive with fetal type bilateral PCA origins, and functionally terminates in the SCA is. No significant basilar stenosis. SCA origins remain patent. Fetal bilateral PCA origins. Left PCA is patent with mild P2 segment irregularity. There is asymmetric moderate irregularity of the proximal right P2 segment, mild to moderate stenosis on series 8, image 16. Right PCA branches remain patent. Anterior circulation: Both ICA siphons are patent. Intermittent bilateral siphon calcified plaque with mild stenosis on the left. No stenosis on the right. Both posterior communicating artery origins are normal. Patent carotid termini. Normal  MCA and ACA origins. Normal anterior communicating artery. Left ACA branches are within normal limits. There is moderate irregularity and stenosis of the right ACA mid to distal A2 best seen on series 8, image 19. ACA branches remain patent. Left MCA M1 segment and bifurcation are patent without stenosis. Left MCA branches appear patent but there is a moderate stenosis of a posterior M3 branch on series 8, image 26. Right MCA M1 segment and bifurcation are patent without stenosis. Right MCA branches appear patent with only mild irregularity. Venous sinuses: Patent. Anatomic variants: Fetal type PCA origins, diminutive basilar artery  which functionally terminates at the SCA is. Review of the MIP images confirms the above findings IMPRESSION: 1. Negative for large vessel occlusion. 2. But Positive for age advanced Intracranial Atherosclerosis. Multifocal stenoses, most notably: - Moderate stenosis of a Left MCA posterior M3 branch. - Moderate stenosis of the Left Vertebral distal V4 segment. - Moderate stenosis Right ACA A2 segment. - up to Moderate stenosis Right PCA P2 segment. 3. No significant extracranial stenosis. Salient results were communicated to Dr. Voncile at 1258 hours on 09/08/2023 by text page via the [facility] messaging system. Electronically Signed   By: VEAR Hurst M.D.   On: 09/08/2023 13:00   CT HEAD CODE STROKE WO CONTRAST Result Date: 09/08/2023 CLINICAL DATA:  Code stroke. 48 year old male with right side weakness. EXAM: CT HEAD WITHOUT CONTRAST TECHNIQUE: Contiguous axial images were obtained from the base of the skull through the vertex without intravenous contrast. RADIATION DOSE REDUCTION: This exam was performed according to the departmental dose-optimization program which includes automated exposure control, adjustment of the mA and/or kV according to patient size and/or use of iterative reconstruction technique. COMPARISON:  Brain MRI 06/06/2016.  Head CT 04/26/2016. FINDINGS: Brain: No midline shift, ventriculomegaly, mass effect, evidence of mass lesion, intracranial hemorrhage or evidence of cortically based acute infarction. Small chronic lacunar infarcts in the bilateral deep gray nuclei, many were present on the 2018 MRI. Superimposed posterior right lentiform perivascular space (normal variant). Brainstem and cerebellum seems spared. Overall cerebral volume remains normal. No cortical encephalomalacia identified. Vascular: No suspicious intracranial vascular hyperdensity. Skull: Intact.  No acute osseous abnormality identified. Sinuses/Orbits: Visualized paranasal sinuses and mastoids are well aerated. Other: No gaze  deviation. No acute orbit or scalp soft tissue finding. ASPECTS [facility] Stroke Program Early CT Score) Total score (0-10 with 10 being normal): 10 IMPRESSION: 1. No acute cortically based infarct or acute intracranial hemorrhage identified. ASPECTS 10. 2. Chronically advanced small vessel disease in the deep gray nuclei, does not appear significantly changed from a 2018 MRI. These results were communicated to Dr. Arora at 12:35 pm on 09/08/2023 by text page via the [facility] messaging system. Electronically Signed   By: VEAR Hurst M.D.   On: 09/08/2023 12:35       HISTORY OF PRESENT ILLNESS 48 y.o. patient with history of CAD status post PCI, hypertension, hyperlipidemia, sleep apnea and CPAP and multiple lacunar infarcts seen on MRI was admitted with acute onset right arm and leg weakness.  HOSPITAL COURSE Patient was given TNK to treat presumed stroke with resultant improvement of symptoms.  MRI was performed demonstrating left paramedian pontine infarct.  He is now stable and ready to be discharged to [facility] for rehab.  Acute Ischemic Infarct:  left paramedian pontine infarct s/p TNK Etiology: Small vessel disease Code Stroke CT head No acute abnormality. Small vessel disease. ASPECTS 10.    CTA head & neck no LVO, moderate stenosis  of left MCA posterior M3 branch, moderate stenosis of left vertebral V4 segment, moderate stenosis right ACA A2 segment, moderate stenosis right PCA P2 segment MRI left paramedian pontine infarct, multiple chronic infarcts 2D Echo EF 60 to 65%, normal left atrial size, no atrial level shunt LDL 98 HgbA1c 8.1 VTE prophylaxis -SCDs clopidogrel 75 mg daily prior to admission, now on aspirin 81 mg daily and Brilinta 90 mg twice daily for 4 weeks and then Brilinta alone Therapy recommendations:  CIR Disposition: Pending   Hx of Stroke/TIA Patient has multiple chronic lacunar infarcts seen on MRI   Hypertension Home meds: Bisoprolol 5 mg daily, lisinopril  20 mg daily, both  prescribed but not currently taking Stable Blood Pressure Goal: BP less than 180/105    Hyperlipidemia Home meds: Rosuvastatin  40 mg daily, prescribed but not taking, resumed in hospital LDL 98, goal < 70 Continue statin at discharge   Diabetes type II poorly controlled Home meds: Tresiba  60 units daily, Jardiance  25 mg daily, dulaglutide  1.5 mg weekly HgbA1c 8.1, goal < 7.0 CBGs SSI Recommend close follow-up with PCP for better DM control   Other Stroke Risk Factors Obesity, Body mass index is 35.43 kg/m., BMI >/= 30 associated with increased stroke risk, recommend weight loss, diet and exercise as appropriate  Coronary artery disease Obstructive sleep apnea, on CPAP at home   DISCHARGE EXAM  General:  Alert, well-nourished, well-developed patient in no acute distress Psych:  Mood and affect appropriate for situation CV: Regular rate and rhythm on monitor Respiratory:  Regular, unlabored respirations on room air     NEURO:  Mental Status: AA&Ox3, patient is able to give clear and coherent history Speech/Language: speech is without dysarthria or aphasia.    Cranial Nerves:  II: PERRL. Visual fields full.  III, IV, VI: EOMI. Eyelids elevate symmetrically.  V: Sensation is intact to light touch and symmetrical to face.  VII: Face is symmetrical resting and smiling VIII: hearing intact to voice. IX, X: Phonation is normal.  XII: tongue is midline without fasciculations. Motor: Able to move all 4 extremities with good antigravity strength but continues to have heaviness of right arm and weakness of right leg no drift, slightly diminished coordination in right arm Tone: is normal and bulk is normal Sensation- Intact to light touch bilaterally.  Coordination: FTN intact bilaterally Gait- deferred   Most Recent NIH 0     Discharge Diet       Diet   Diet heart healthy/carb modified Fluid consistency: Thin   liquids  DISCHARGE PLAN Disposition: Rehab aspirin 81 mg  daily and Brilinta (ticagrelor) 90 mg bid for secondary stroke prevention for 4 weeks then Brilinta (ticagrelor) 90 mg bid alone given history of significant coronary artery disease. Ongoing stroke risk factor control by Primary Care Physician at time of discharge Follow-up PCP Avelina, Amy E, MD in 2 weeks. Follow up with allergy, cardiology and endocrinology in [place]  34 minutes were spent preparing discharge.  Patient seen and examined by NP/APP with MD. MD to update note as needed.   Jorene Last, DNP, FNP-BC [facility] Pager: [phone] I have personally obtained history,examined this patient, reviewed notes, independently viewed imaging studies, participated in medical decision making and plan of care.ROS completed by me personally and pertinent positives fully documented  I have made any additions or clarifications directly to the above note. Agree with note above.    Eather Popp, MD Medical Director [facility] Pager: [phone] 09/11/2023 3:25 PM

## 2023-09-10 NOTE — Progress Notes (Signed)
 Inpatient Rehab Admissions Coordinator Note:   Per therapy recommendations patient was screened for CIR candidacy by Reche FORBES Lowers, PT. At this time, pt appears to be a potential candidate for CIR. I will place an order for rehab consult for full assessment, per our protocol.  Please contact me any with questions.SABRA Reche Lowers, PT, DPT [phone] 09/10/23 10:10 AM

## 2023-09-10 NOTE — Plan of Care (Signed)
   Problem: Ischemic Stroke/TIA Tissue Perfusion: Goal: Complications of ischemic stroke/TIA will be minimized Outcome: Progressing   Problem: Coping: Goal: Will verbalize positive feelings about self Outcome: Progressing

## 2023-09-10 NOTE — Evaluation (Signed)
 Speech Language Pathology Evaluation Patient Details Name: Tyler Hoffman MRN: [id number] DOB: February 16, 1976 Today's Date: 09/10/2023 Time: 8890-8870 SLP Time Calculation (min) (ACUTE ONLY): 20 min  Problem List:  Patient Active Problem List   Diagnosis Date Noted   Bradycardia 09/10/2023   Acute ischemic stroke (HCC) 09/08/2023   Coronary artery disease involving native coronary artery of native heart without angina pectoris 07/19/2020   Lacunar infarction (HCC) 04/27/2016   Hypertension associated with diabetes (HCC) 04/29/2014   Family history of premature CAD 04/29/2014   Obesity (BMI 30-39.9) 10/16/2013   OSA (obstructive sleep apnea) 11/05/2011   Diabetes mellitus with circulatory complication, with long-term current use of insulin  (HCC) 04/27/2009   CONJUNCTIVITIS, ALLERGIC 06/04/2008   Hyperlipidemia associated with type 2 diabetes mellitus (HCC) 08/22/2006   Past Medical History:  Past Medical History:  Diagnosis Date   CAD S/P DES PCI-LAD (08/2020 - in [place]) 07/19/2020   CTA 07/11/2020 (performed in [place]): In response to abnormal functional study. Coronary Calcium  Score 0.3.  LAD and circumflex have separate ostia.  Appears to be occlusion of the LAD after SP1.  LCx gives off 1 major marginal branch with no significant disease.  RCA has diffuse atheroma with mod stenosis prior to Crux.  LVEF Nl Systolic Fxn. ->  Cath = DES PCI prox LAD SubTO, FFR Neg RCA.   Essential hypertension 05/22/2016   History of rosacea    Hyperlipidemia associated with type 2 diabetes mellitus (HCC) 08/22/2006   Qualifier: Diagnosis of  By: Baird FNP, Frieda Kipper    Lacunar infarction (HCC) 05/2016   No residual infarct   Obesity (BMI 30-39.9) 10/16/2013   OSA on CPAP    Uses CPAP   Type II diabetes mellitus with complication (HCC)    [place] CVA &  CAD- PCI LAD   Past Surgical History:  Past Surgical History:  Procedure Laterality Date   CORONARY STENT INTERVENTION  08/31/2020   [place],  [place]: DES PCI LAD (subtotal occlusion ~ 7 mm, downstream of SP1); - Unknowns STENT Size/ Brand..   CT CTA CORONARY W/CA SCORE W/CM &/OR WO/CM  07/11/2020   Performed in [place] in response to abnormal functional study: o Coronary Calcium  Score 0.3.  LAD and circumflex have separate ostia.  Appears to be occlusion of the LAD after SP1.  LCx gives off 1 major marginal branch with no significant disease.  RCA has diffuse atheroma with moderate stenosis prior to the crux.  LVEF normal systolic function. ->  Coronary angiography recommended.   LEFT HEART CATH AND CORONARY ANGIOGRAPHY  08/31/2020   [place], [place]): Two-vessel CAD with subtotal occlusion just distal to SP1.  Subcritical stenosis of the right coronary artery (IFR and FFR negative). => LAD PCI performed with good result.  (Right radial access)   nasoplasty  08/11/2003   TRANSTHORACIC ECHOCARDIOGRAM  06/07/2016   With bubble study for CVA evaluation: ([place]) normal LV function.  EF 66 5%.  No RWMA.  GR 1 DD.  No IAS, PFO or ASD noted.  No R-L shunt.  Normal valves.   TRANSTHORACIC ECHOCARDIOGRAM  12/2018   [place], [place]:ormal biventricular function.  EF 65%.  Normal wall motion.  No significant valvular disease.  No pleural effusion.  Normal aorta within the level of portable section.   TRANSTHORACIC ECHOCARDIOGRAM  08/2020   [place], [place]: Normal EF. evidence of hypertensive heart disease with normal biventricular function.  Normal aortic.  Normal valves.   HPI:  48yo male admitted 09/08/23 with right side weakness.  PMH: prior chronic lacunar infarctions, CAD s/p PCI, HTN, HLD, DM2, OSA/CPAP. MRI - 9mm focus of acute ifarct left paramedian pons, mild chronic microvascular ischemic changes, remote lacunar infarcts bilateral basal ganglia.   Assessment / Plan / Recommendation Clinical Impression  Pt seen at bedside for cognitive linguistic evaluation. CN exam unremarkable. Speech is fully intelligible, however, pt reports he needs to  focus on articulation, or his speech sounds slurred to him. Receptive and Expressive Language appear intact. The [employer] Mental Status (SLUMS) Examination was administered today. Pt scored 27/30, with points lost on delayed recall (4/5 words recalled), and auditory attention and recall. Based on performance, pt score falls within functional limits, however, he was encouraged to notify PCP if brain fog he mentioned does not improve, or if he notices difficulty upon return to normal routines. No further ST intervention recommended acutely. ST signing off.    SLP Assessment  SLP Recommendation/Assessment: All further Speech Language Pathology needs can be addressed in the next venue of care (if needs arise) SLP Visit Diagnosis: Cognitive communication deficit (R41.841)     Assistance Recommended at Discharge   Per PT/OT  Functional Status Assessment Patient has not had a recent decline in their functional status     SLP Evaluation Cognition  Overall Cognitive Status: Within Functional Limits for tasks assessed Arousal/Alertness: Awake/alert Orientation Level: Oriented X4       Comprehension  Auditory Comprehension Overall Auditory Comprehension: Appears within functional limits for tasks assessed    Expression Expression Primary Mode of Expression: Verbal Verbal Expression Overall Verbal Expression: Appears within functional limits for tasks assessed Written Expression Dominant Hand: Right   Oral / Motor  Oral Motor/Sensory Function Overall Oral Motor/Sensory Function: Within functional limits Motor Speech Overall Motor Speech: Other (comment) (Fully intelligible. Pt reports he feels like he needs to articulate more carefully for speech to be clearest.) Articulation: Within functional limitis Intelligibility: Intelligible Motor Planning: Within functional limits Motor Speech Errors: Not applicable           Ovie Cornelio B. Dory, MSP, CCC-SLP Speech Language  Pathologist Office: [phone]  Dory Caprice Daring 09/10/2023, 11:36 AM

## 2023-09-10 NOTE — Progress Notes (Signed)
 SLP Cancellation Note  Patient Details Name: Tyler Hoffman MRN: [id number] DOB: April 19, 1975   Cancelled treatment:       Reason Eval/Treat Not Completed: Patient at procedure or test/unavailable. Unable to complete SLE at this time. Pt currently with PT. Will continue efforts.   Baldomero Mirarchi B. Dory, MSP, CCC-SLP Speech Language Pathologist Office: [phone]  Dory Caprice Daring 09/10/2023, 10:42 AM

## 2023-09-10 NOTE — Care Plan (Signed)
 Called and spoke with the patient's wife to inform her that patient's appointment has been rescheduled to 09/19/23 at 0900. She verbalized understanding with all questions and concerns addressed.

## 2023-09-10 NOTE — Telephone Encounter (Signed)
 Looks like one of my partners saw you in the hospital.  Did not look like much was being done about shortness of breath.  I would imagine you are getting an echocardiogram done though.  We can look at that.  Let me look into what we can do about rescheduling.

## 2023-09-10 NOTE — Plan of Care (Signed)
   Problem: Education: Goal: Knowledge of disease or condition will improve Outcome: Progressing

## 2023-09-10 NOTE — Telephone Encounter (Signed)
 Called and spoke with the patient's wife to inform her that patient's appointment has been rescheduled to 09/19/23 at 0900. She verbalized understanding with all questions and concerns addressed.

## 2023-09-10 NOTE — Progress Notes (Addendum)
 STROKE TEAM PROGRESS NOTE    SIGNIFICANT HOSPITAL EVENTS 6/29: Patient admitted with right-sided weakness and given TNK 6/30: Patient found to have left paramedian pontine infarct 7/1: Patient transferred out of the ICU  INTERIM HISTORY/SUBJECTIVE Patient remains hemodynamically stable and afebrile.  He is ready to transfer out of the ICU today and will be going to CIR. Neurological exam is unchanged.  MRI confirms left pontine lacunar infarct.  Neurological  exam is stable OBJECTIVE  CBC    Component Value Date/Time   WBC 8.2 09/10/2023 0609   RBC 4.90 09/10/2023 0609   HGB 15.8 09/10/2023 0609   HCT 46.5 09/10/2023 0609   PLT 231 09/10/2023 0609   MCV 94.9 09/10/2023 0609   MCH 32.2 09/10/2023 0609   MCHC 34.0 09/10/2023 0609   RDW 12.5 09/10/2023 0609   LYMPHSABS 2.4 09/08/2023 1222   MONOABS 0.4 09/08/2023 1222   EOSABS 0.1 09/08/2023 1222   BASOSABS 0.0 09/08/2023 1222    BMET    Component Value Date/Time   NA 137 09/10/2023 0609   K 4.0 09/10/2023 0609   CL 106 09/10/2023 0609   CO2 22 09/10/2023 0609   GLUCOSE 136 (H) 09/10/2023 0609   BUN 22 (H) 09/10/2023 0609   CREATININE 0.81 09/10/2023 0609   CALCIUM  8.7 (L) 09/10/2023 0609   GFRNONAA >60 09/10/2023 0609    IMAGING past 24 hours ECHOCARDIOGRAM COMPLETE Result Date: 09/09/2023    ECHOCARDIOGRAM REPORT   Patient Name:   Tyler Hoffman Date of Exam: 09/09/2023 Medical Rec #:  [id number]   Height:       63.0 in Accession #:    [id number]  Weight:       200.0 lb Date of Birth:  Oct 29, 1975    BSA:          1.934 m Patient Age:    48 years    BP:           140/80 mmHg Patient Gender: M           HR:           77 bpm. Exam Location:  Inpatient Procedure: 2D Echo, Cardiac Doppler and Color Doppler (Both Spectral and Color            Flow Doppler were utilized during procedure). Indications:    Stroke I63.9  History:        Patient has prior history of Echocardiogram examinations, most                 recent 06/07/2016. CAD,  Stroke; Risk Factors:Hypertension,                 Diabetes, Dyslipidemia and Sleep Apnea.  Sonographer:    Thea Norlander RCS Referring Phys: [id number] ASHISH ARORA IMPRESSIONS  1. Left ventricular ejection fraction, by estimation, is 60 to 65%. The left ventricle has normal function. The left ventricle has no regional wall motion abnormalities. Left ventricular diastolic parameters were normal.  2. Right ventricular systolic function is normal. The right ventricular size is normal.  3. The mitral valve is normal in structure. No evidence of mitral valve regurgitation. No evidence of mitral stenosis.  4. The aortic valve is normal in structure. There is mild calcification of the aortic valve. Aortic valve regurgitation is not visualized. No aortic stenosis is present.  5. The inferior vena cava is normal in size with greater than 50% respiratory variability, suggesting right atrial pressure of 3 mmHg. FINDINGS  Left Ventricle: Left ventricular  ejection fraction, by estimation, is 60 to 65%. The left ventricle has normal function. The left ventricle has no regional wall motion abnormalities. The left ventricular internal cavity size was normal in size. There is  no left ventricular hypertrophy. Left ventricular diastolic parameters were normal. Right Ventricle: The right ventricular size is normal. No increase in right ventricular wall thickness. Right ventricular systolic function is normal. Left Atrium: Left atrial size was normal in size. Right Atrium: Right atrial size was normal in size. Pericardium: There is no evidence of pericardial effusion. Mitral Valve: The mitral valve is normal in structure. No evidence of mitral valve regurgitation. No evidence of mitral valve stenosis. Tricuspid Valve: The tricuspid valve is normal in structure. Tricuspid valve regurgitation is not demonstrated. No evidence of tricuspid stenosis. Aortic Valve: The aortic valve is normal in structure. There is mild calcification of the  aortic valve. Aortic valve regurgitation is not visualized. No aortic stenosis is present. Aortic valve peak gradient measures 4.9 mmHg. Pulmonic Valve: The pulmonic valve was normal in structure. Pulmonic valve regurgitation is not visualized. No evidence of pulmonic stenosis. Aorta: The aortic root is normal in size and structure. Venous: The inferior vena cava is normal in size with greater than 50% respiratory variability, suggesting right atrial pressure of 3 mmHg. IAS/Shunts: No atrial level shunt detected by color flow Doppler.  LEFT VENTRICLE PLAX 2D LVIDd:         4.60 cm   Diastology LVIDs:         3.10 cm   LV e' medial:    12.60 cm/s LV PW:         0.90 cm   LV E/e' medial:  5.5 LV IVS:        0.90 cm   LV e' lateral:   13.20 cm/s LVOT diam:     2.20 cm   LV E/e' lateral: 5.2 LV SV:         63 LV SV Index:   33 LVOT Area:     3.80 cm  RIGHT VENTRICLE RV S prime:     14.50 cm/s TAPSE (M-mode): 2.3 cm LEFT ATRIUM             Index        RIGHT ATRIUM           Index LA diam:        3.80 cm 1.97 cm/m   RA Area:     17.00 cm LA Vol (A2C):   33.0 ml 17.09 ml/m  RA Volume:   45.70 ml  23.63 ml/m LA Vol (A4C):   55.2 ml 28.55 ml/m LA Biplane Vol: 46.6 ml 24.10 ml/m  AORTIC VALVE AV Area (Vmax): 2.92 cm AV Vmax:        111.00 cm/s AV Peak Grad:   4.9 mmHg LVOT Vmax:      85.40 cm/s LVOT Vmean:     55.000 cm/s LVOT VTI:       0.166 m  AORTA Ao Root diam: 3.20 cm Ao Asc diam:  2.90 cm MITRAL VALVE MV Area (PHT): 3.85 cm    SHUNTS MV Decel Time: 197 msec    Systemic VTI:  0.17 m MV E velocity: 68.70 cm/s  Systemic Diam: 2.20 cm MV A velocity: 69.70 cm/s MV E/A ratio:  0.99 Aditya Sabharwal Electronically signed by Ria Commander Signature Date/Time: 09/09/2023/5:42:03 PM    Final    MR BRAIN WO CONTRAST Result Date: 09/09/2023 CLINICAL DATA:  Stroke follow-up. EXAM:  MRI HEAD WITHOUT CONTRAST TECHNIQUE: Multiplanar, multiecho pulse sequences of the brain and surrounding structures were obtained without  intravenous contrast. COMPARISON:  CT head and CTA head and neck 09/08/2023. FINDINGS: Brain: 9 mm focus of restricted diffusion in the left para median pons at the level of the middle cerebellar peduncles. No evidence of intracranial hemorrhage. T2/FLAIR hyperintensity in the periventricular and subcortical white matter. Remote lacunar infarcts in the bilateral basal ganglia. Additional areas likely reflecting prominent perivascular spaces in the inferior aspect of both basal ganglia. No edema, mass effect, or midline shift. The basilar cisterns are patent. No extra-axial fluid collections. Ventricles: Normal size and configuration of the ventricles. Vascular: Skull base flow voids are visualized. Skull and upper cervical spine: No focal abnormality. Sinuses/Orbits: Orbits are symmetric. Mucous retention cyst in the left maxillary sinus. Other: Mastoid air cells are clear. IMPRESSION: 9 mm focus of acute infarct in the left paramedian pons. Mild chronic microvascular ischemic changes. Remote lacunar infarcts in the bilateral basal ganglia. Electronically Signed   By: Donnice Mania M.D.   On: 09/09/2023 14:08    Vitals:   09/10/23 0700 09/10/23 0800 09/10/23 0829 09/10/23 0900  BP: 117/73  123/74 114/67  Pulse:  94  95  Resp: 15 16  18   Temp: 97.9 F (36.6 C)     TempSrc: Oral     SpO2:  97%  93%  Weight:      Height:         PHYSICAL EXAM General:  Alert, well-nourished, well-developed patient in no acute distress Psych:  Mood and affect appropriate for situation CV: Regular rate and rhythm on monitor Respiratory:  Regular, unlabored respirations on room air   NEURO:  Mental Status: AA&Ox3, patient is able to give clear and coherent history Speech/Language: speech is without dysarthria or aphasia.   Cranial Nerves:  II: PERRL. Visual fields full.  III, IV, VI: EOMI. Eyelids elevate symmetrically.  V: Sensation is intact to light touch and symmetrical to face.  VII: Face is  symmetrical resting and smiling VIII: hearing intact to voice. IX, X: Phonation is normal.  XII: tongue is midline without fasciculations. Motor: Able to move all 4 extremities with good antigravity strength but continues to have heaviness of right arm and weakness of right leg no drift, slightly diminished coordination in right arm Tone: is normal and bulk is normal Sensation- Intact to light touch bilaterally.  Coordination: FTN intact bilaterally Gait- deferred  Most Recent NIH 0   ASSESSMENT/PLAN  Tyler Hoffman is a 48 y.o. male with history of CAD status post PCI, hypertension, hyperlipidemia, diabetes, sleep apnea on CPAP and lacunar infarct seen on MRI admitted for acute onset right arm and leg weakness.  Initial head CT was negative, and patient was given TNK to treat presumed stroke.  Symptoms did improve after TNK.  MRI demonstrates left paramedian pontine infarct.  NIH on Admission 2  Acute Ischemic Infarct:  left paramedian pontine infarct s/p TNK Etiology: Small vessel disease Code Stroke CT head No acute abnormality. Small vessel disease. ASPECTS 10.    CTA head & neck no LVO, moderate stenosis of left MCA posterior M3 branch, moderate stenosis of left vertebral V4 segment, moderate stenosis right ACA A2 segment, moderate stenosis right PCA P2 segment MRI left paramedian pontine infarct, multiple chronic infarcts 2D Echo EF 60 to 65%, normal left atrial size, no atrial level shunt LDL 98 HgbA1c 8.1 VTE prophylaxis -SCDs clopidogrel 75 mg daily prior to admission, now  on aspirin 81 mg daily and Brilinta 90 mg twice daily for 4 weeks and then Brilinta alone Therapy recommendations:  CIR Disposition: Pending  Hx of Stroke/TIA Patient has multiple chronic lacunar infarcts seen on MRI  Hypertension Home meds: Bisoprolol 5 mg daily, lisinopril  20 mg daily, both prescribed but not currently taking Stable Blood Pressure Goal: BP less than 180/105   Hyperlipidemia Home  meds: Rosuvastatin  40 mg daily, prescribed but not taking, resumed in hospital LDL 98, goal < 70 Continue statin at discharge  Diabetes type II poorly controlled Home meds: Tresiba  60 units daily, Jardiance  25 mg daily, dulaglutide  1.5 mg weekly HgbA1c 8.1, goal < 7.0 CBGs SSI Recommend close follow-up with PCP for better DM control  Other Stroke Risk Factors Obesity, Body mass index is 35.43 kg/m., BMI >/= 30 associated with increased stroke risk, recommend weight loss, diet and exercise as appropriate  Coronary artery disease Obstructive sleep apnea, on CPAP at home   Other Active Problems None  Hospital day # 2  Patient seen by NP with MD, MD to edit note as needed. Cortney E Everitt Clint Kill , MSN, AGACNP-BC [facility] See Amion for schedule and pager information 09/10/2023 11:49 AM I have personally obtained history,examined this patient, reviewed notes, independently viewed imaging studies, participated in medical decision making and plan of care.ROS completed by me personally and pertinent positives fully documented  I have made any additions or clarifications directly to the above note. Agree with note above.  Continue mobilization out of bed.  Therapy consults.  Transfer out of ICU to floor bed.  Patient will likely need inpatient rehab.  Discussed with patient and friend at the bedside.  Greater than 50% time during this 50-minute visit was spent in counseling and coordination of care and discussion with patient and care team and answering questions.  Eather Popp, MD Medical Director [facility] Pager: [phone] 09/10/2023 3:14 PM  To contact Stroke Continuity provider, please refer to [email or web address]. After hours, contact General Neurology

## 2023-09-10 NOTE — Progress Notes (Signed)
 Subjective:  Denies SSCP, palpitations or Dyspnea   Objective:  Vitals:   09/10/23 0434 09/10/23 0500 09/10/23 0700 09/10/23 0829  BP: 98/79 113/75  123/74  Pulse:      Resp:  16    Temp:   97.9 F (36.6 C)   TempSrc:   Oral   SpO2:      Weight:      Height:        Intake/Output from previous day: No intake or output data in the 24 hours ending 09/10/23 0909  Physical Exam:  Affect appropriate Healthy:  appears stated age HEENT: normal Neck supple with no adenopathy JVP normal no bruits no thyromegaly Lungs clear with no wheezing and good diaphragmatic motion Heart:  S1/S2 no murmur, no rub, gallop or click PMI normal Abdomen: benighn, BS positve, no tenderness, no AAA no bruit.  No HSM or HJR Distal pulses intact with no bruits No edema Neuro mild RLE weakness/paresthesias  Skin warm and dry No muscular weakness   Lab Results: Basic Metabolic Panel: Recent Labs    09/08/23 1222 09/08/23 1223 09/10/23 0609  NA 141 141 137  K 4.1 4.2 4.0  CL 103 105 106  CO2 24  --  22  GLUCOSE 135* 136* 136*  BUN 11 13 22*  CREATININE 0.84 0.80 0.81  CALCIUM  9.2  --  8.7*  MG 2.0  --   --    Liver Function Tests: Recent Labs    09/08/23 1222  AST 27  ALT 35  ALKPHOS 60  BILITOT 1.0  PROT 6.9  ALBUMIN 4.2   No results for input(s): LIPASE, AMYLASE in the last 72 hours. CBC: Recent Labs    09/08/23 1222 09/08/23 1223 09/10/23 0609  WBC 7.7  --  8.2  NEUTROABS 4.7  --   --   HGB 17.1* 16.7 15.8  HCT 49.0 49.0 46.5  MCV 95.9  --  94.9  PLT 198  --  231   Cardiac Enzymes: No results for input(s): CKTOTAL, CKMB, CKMBINDEX, TROPONINI in the last 72 hours. BNP: Invalid input(s): POCBNP D-Dimer: No results for input(s): DDIMER in the last 72 hours. Hemoglobin A1C: No results for input(s): HGBA1C in the last 72 hours. Fasting Lipid Panel: Recent Labs    09/09/23 0724  CHOL 171  HDL 42  LDLCALC 98  TRIG 155*  CHOLHDL 4.1      Imaging: ECHOCARDIOGRAM COMPLETE Result Date: 09/09/2023    ECHOCARDIOGRAM REPORT   Patient Name:   ARDEN TINOCO Date of Exam: 09/09/2023 Medical Rec #:  [id number]   Height:       63.0 in Accession #:    [id number]  Weight:       200.0 lb Date of Birth:  Jul 22, 1975    BSA:          1.934 m Patient Age:    48 years    BP:           140/80 mmHg Patient Gender: M           HR:           77 bpm. Exam Location:  Inpatient Procedure: 2D Echo, Cardiac Doppler and Color Doppler (Both Spectral and Color            Flow Doppler were utilized during procedure). Indications:    Stroke I63.9  History:        Patient has prior history of Echocardiogram examinations, most  recent 06/07/2016. CAD, Stroke; Risk Factors:Hypertension,                 Diabetes, Dyslipidemia and Sleep Apnea.  Sonographer:    Thea Norlander RCS Referring Phys: [id number] ASHISH ARORA IMPRESSIONS  1. Left ventricular ejection fraction, by estimation, is 60 to 65%. The left ventricle has normal function. The left ventricle has no regional wall motion abnormalities. Left ventricular diastolic parameters were normal.  2. Right ventricular systolic function is normal. The right ventricular size is normal.  3. The mitral valve is normal in structure. No evidence of mitral valve regurgitation. No evidence of mitral stenosis.  4. The aortic valve is normal in structure. There is mild calcification of the aortic valve. Aortic valve regurgitation is not visualized. No aortic stenosis is present.  5. The inferior vena cava is normal in size with greater than 50% respiratory variability, suggesting right atrial pressure of 3 mmHg. FINDINGS  Left Ventricle: Left ventricular ejection fraction, by estimation, is 60 to 65%. The left ventricle has normal function. The left ventricle has no regional wall motion abnormalities. The left ventricular internal cavity size was normal in size. There is  no left ventricular hypertrophy. Left ventricular  diastolic parameters were normal. Right Ventricle: The right ventricular size is normal. No increase in right ventricular wall thickness. Right ventricular systolic function is normal. Left Atrium: Left atrial size was normal in size. Right Atrium: Right atrial size was normal in size. Pericardium: There is no evidence of pericardial effusion. Mitral Valve: The mitral valve is normal in structure. No evidence of mitral valve regurgitation. No evidence of mitral valve stenosis. Tricuspid Valve: The tricuspid valve is normal in structure. Tricuspid valve regurgitation is not demonstrated. No evidence of tricuspid stenosis. Aortic Valve: The aortic valve is normal in structure. There is mild calcification of the aortic valve. Aortic valve regurgitation is not visualized. No aortic stenosis is present. Aortic valve peak gradient measures 4.9 mmHg. Pulmonic Valve: The pulmonic valve was normal in structure. Pulmonic valve regurgitation is not visualized. No evidence of pulmonic stenosis. Aorta: The aortic root is normal in size and structure. Venous: The inferior vena cava is normal in size with greater than 50% respiratory variability, suggesting right atrial pressure of 3 mmHg. IAS/Shunts: No atrial level shunt detected by color flow Doppler.  LEFT VENTRICLE PLAX 2D LVIDd:         4.60 cm   Diastology LVIDs:         3.10 cm   LV e' medial:    12.60 cm/s LV PW:         0.90 cm   LV E/e' medial:  5.5 LV IVS:        0.90 cm   LV e' lateral:   13.20 cm/s LVOT diam:     2.20 cm   LV E/e' lateral: 5.2 LV SV:         63 LV SV Index:   33 LVOT Area:     3.80 cm  RIGHT VENTRICLE RV S prime:     14.50 cm/s TAPSE (M-mode): 2.3 cm LEFT ATRIUM             Index        RIGHT ATRIUM           Index LA diam:        3.80 cm 1.97 cm/m   RA Area:     17.00 cm LA Vol (A2C):   33.0 ml 17.09 ml/m  RA  Volume:   45.70 ml  23.63 ml/m LA Vol (A4C):   55.2 ml 28.55 ml/m LA Biplane Vol: 46.6 ml 24.10 ml/m  AORTIC VALVE AV Area (Vmax): 2.92  cm AV Vmax:        111.00 cm/s AV Peak Grad:   4.9 mmHg LVOT Vmax:      85.40 cm/s LVOT Vmean:     55.000 cm/s LVOT VTI:       0.166 m  AORTA Ao Root diam: 3.20 cm Ao Asc diam:  2.90 cm MITRAL VALVE MV Area (PHT): 3.85 cm    SHUNTS MV Decel Time: 197 msec    Systemic VTI:  0.17 m MV E velocity: 68.70 cm/s  Systemic Diam: 2.20 cm MV A velocity: 69.70 cm/s MV E/A ratio:  0.99 Aditya Sabharwal Electronically signed by Ria Commander Signature Date/Time: 09/09/2023/5:42:03 PM    Final    MR BRAIN WO CONTRAST Result Date: 09/09/2023 CLINICAL DATA:  Stroke follow-up. EXAM: MRI HEAD WITHOUT CONTRAST TECHNIQUE: Multiplanar, multiecho pulse sequences of the brain and surrounding structures were obtained without intravenous contrast. COMPARISON:  CT head and CTA head and neck 09/08/2023. FINDINGS: Brain: 9 mm focus of restricted diffusion in the left para median pons at the level of the middle cerebellar peduncles. No evidence of intracranial hemorrhage. T2/FLAIR hyperintensity in the periventricular and subcortical white matter. Remote lacunar infarcts in the bilateral basal ganglia. Additional areas likely reflecting prominent perivascular spaces in the inferior aspect of both basal ganglia. No edema, mass effect, or midline shift. The basilar cisterns are patent. No extra-axial fluid collections. Ventricles: Normal size and configuration of the ventricles. Vascular: Skull base flow voids are visualized. Skull and upper cervical spine: No focal abnormality. Sinuses/Orbits: Orbits are symmetric. Mucous retention cyst in the left maxillary sinus. Other: Mastoid air cells are clear. IMPRESSION: 9 mm focus of acute infarct in the left paramedian pons. Mild chronic microvascular ischemic changes. Remote lacunar infarcts in the bilateral basal ganglia. Electronically Signed   By: Donnice Mania M.D.   On: 09/09/2023 14:08   CT ANGIO HEAD NECK W WO CM (CODE STROKE) Result Date: 09/08/2023 CLINICAL DATA:  48 year old male  code stroke presentation. Right side weakness. EXAM: CT ANGIOGRAPHY HEAD AND NECK TECHNIQUE: Multidetector CT imaging of the head and neck was performed using the standard protocol during bolus administration of intravenous contrast. Multiplanar CT image reconstructions and MIPs were obtained to evaluate the vascular anatomy. Carotid stenosis measurements (when applicable) are obtained utilizing NASCET criteria, using the distal internal carotid diameter as the denominator. RADIATION DOSE REDUCTION: This exam was performed according to the departmental dose-optimization program which includes automated exposure control, adjustment of the mA and/or kV according to patient size and/or use of iterative reconstruction technique. CONTRAST:  75mL OMNIPAQUE  IOHEXOL  350 MG/ML SOLN COMPARISON:  Head CT 1227 hours today. FINDINGS: CTA NECK Skeleton: No acute osseous abnormality identified. Upper chest: Negative. Other neck: Glottis is partially closed. Nonvascular neck soft tissue spaces are within normal limits. Aortic arch: 3 vessel arch.  Minimal calcified arch atherosclerosis. Right carotid system: Patent to the skull base with no significant plaque. No stenosis. Left carotid system: Patent with no significant plaque. No stenosis. Vertebral arteries: Minimal plaque at the right subclavian artery origin. Normal right vertebral artery origin. Patent right vertebral artery to the skull base with no plaque, no stenosis. Normal proximal left subclavian artery and left vertebral artery origin. Codominant left vertebral artery is patent to the skull base with no plaque or stenosis. CTA  HEAD Posterior circulation: Codominant distal vertebral arteries. Left V4 segment moderate calcified plaque, and moderate stenosis distal to the left PICA origin which remains patent (series 12, image 91). Right V4 segment is patent to the vertebrobasilar junction with mild stenosis. Right PICA origin is patent. Patent basilar artery with mild  irregularity throughout its course. The basilar is diminutive with fetal type bilateral PCA origins, and functionally terminates in the SCA is. No significant basilar stenosis. SCA origins remain patent. Fetal bilateral PCA origins. Left PCA is patent with mild P2 segment irregularity. There is asymmetric moderate irregularity of the proximal right P2 segment, mild to moderate stenosis on series 8, image 16. Right PCA branches remain patent. Anterior circulation: Both ICA siphons are patent. Intermittent bilateral siphon calcified plaque with mild stenosis on the left. No stenosis on the right. Both posterior communicating artery origins are normal. Patent carotid termini. Normal MCA and ACA origins. Normal anterior communicating artery. Left ACA branches are within normal limits. There is moderate irregularity and stenosis of the right ACA mid to distal A2 best seen on series 8, image 19. ACA branches remain patent. Left MCA M1 segment and bifurcation are patent without stenosis. Left MCA branches appear patent but there is a moderate stenosis of a posterior M3 branch on series 8, image 26. Right MCA M1 segment and bifurcation are patent without stenosis. Right MCA branches appear patent with only mild irregularity. Venous sinuses: Patent. Anatomic variants: Fetal type PCA origins, diminutive basilar artery which functionally terminates at the SCA is. Review of the MIP images confirms the above findings IMPRESSION: 1. Negative for large vessel occlusion. 2. But Positive for age advanced Intracranial Atherosclerosis. Multifocal stenoses, most notably: - Moderate stenosis of a Left MCA posterior M3 branch. - Moderate stenosis of the Left Vertebral distal V4 segment. - Moderate stenosis Right ACA A2 segment. - up to Moderate stenosis Right PCA P2 segment. 3. No significant extracranial stenosis. Salient results were communicated to Dr. Voncile at 1258 hours on 09/08/2023 by text page via the [facility] messaging system.  Electronically Signed   By: VEAR Hurst M.D.   On: 09/08/2023 13:00   CT HEAD CODE STROKE WO CONTRAST Result Date: 09/08/2023 CLINICAL DATA:  Code stroke. 48 year old male with right side weakness. EXAM: CT HEAD WITHOUT CONTRAST TECHNIQUE: Contiguous axial images were obtained from the base of the skull through the vertex without intravenous contrast. RADIATION DOSE REDUCTION: This exam was performed according to the departmental dose-optimization program which includes automated exposure control, adjustment of the mA and/or kV according to patient size and/or use of iterative reconstruction technique. COMPARISON:  Brain MRI 06/06/2016.  Head CT 04/26/2016. FINDINGS: Brain: No midline shift, ventriculomegaly, mass effect, evidence of mass lesion, intracranial hemorrhage or evidence of cortically based acute infarction. Small chronic lacunar infarcts in the bilateral deep gray nuclei, many were present on the 2018 MRI. Superimposed posterior right lentiform perivascular space (normal variant). Brainstem and cerebellum seems spared. Overall cerebral volume remains normal. No cortical encephalomalacia identified. Vascular: No suspicious intracranial vascular hyperdensity. Skull: Intact.  No acute osseous abnormality identified. Sinuses/Orbits: Visualized paranasal sinuses and mastoids are well aerated. Other: No gaze deviation. No acute orbit or scalp soft tissue finding. ASPECTS [facility] Stroke Program Early CT Score) Total score (0-10 with 10 being normal): 10 IMPRESSION: 1. No acute cortically based infarct or acute intracranial hemorrhage identified. ASPECTS 10. 2. Chronically advanced small vessel disease in the deep gray nuclei, does not appear significantly changed from a 2018 MRI. These results  were communicated to Dr. Arora at 12:35 pm on 09/08/2023 by text page via the [facility] messaging system. Electronically Signed   By: VEAR Hurst M.D.   On: 09/08/2023 12:35    Cardiac Studies:  ECG: NSR normal    Telemetry:  NSR  Echo: Normal EF 60-65%   Medications:    aspirin EC  81 mg Oral Daily   insulin  aspart  0-15 Units Subcutaneous TID WC   pantoprazole  40 mg Oral QHS   rosuvastatin   40 mg Oral Daily   ticagrelor  90 mg Oral BID      Assessment/Plan:  Deionte Spivack is a 48 y.o. male with a hx of prior history of CVA, hypertension, hyperlipidemia, diabetes type 2, obesity, OSA, CAD s/p DES to LAD 08/2020 and HLD, moderate RCA disease FFR negative who is being seen 09/08/2023 for the evaluation of bradycardia at the request of Dr. Voncile.    Bradycardia:  ? Vagal in setting of TIA. No indication for pacer Ok to resume zebeta on d/c. He is going back to [place] end of month Has a cardiologist there  2 week monitor applied and will review as outpatient when done  CAD DES to LAD 07/11/20  On ASA/Ticagrelor per neuro  TIA:  no PAF carotids ok some intracranial arterial dx per neuro   No further cardiology w/u planned will sign off   Maude Emmer 09/10/2023, 9:09 AM

## 2023-09-11 ENCOUNTER — Telehealth: Admitting: Cardiology

## 2023-09-11 ENCOUNTER — Encounter (HOSPITAL_COMMUNITY): Payer: Self-pay | Admitting: Physical Medicine and Rehabilitation

## 2023-09-11 ENCOUNTER — Other Ambulatory Visit: Payer: Self-pay

## 2023-09-11 ENCOUNTER — Inpatient Hospital Stay (HOSPITAL_COMMUNITY)
Admission: AD | Admit: 2023-09-11 | Discharge: 2023-09-16 | DRG: 057 | Disposition: A | Discharge status: Home / Self Care | Source: Intra-hospital | Attending: Physical Medicine and Rehabilitation | Admitting: Physical Medicine and Rehabilitation

## 2023-09-11 DIAGNOSIS — E1159 Type 2 diabetes mellitus with other circulatory complications: Secondary | ICD-10-CM | POA: Diagnosis present

## 2023-09-11 DIAGNOSIS — Z79899 Other long term (current) drug therapy: Secondary | ICD-10-CM

## 2023-09-11 DIAGNOSIS — E1169 Type 2 diabetes mellitus with other specified complication: Secondary | ICD-10-CM | POA: Diagnosis present

## 2023-09-11 DIAGNOSIS — Z882 Allergy status to sulfonamides status: Secondary | ICD-10-CM

## 2023-09-11 DIAGNOSIS — I2510 Atherosclerotic heart disease of native coronary artery without angina pectoris: Secondary | ICD-10-CM | POA: Diagnosis present

## 2023-09-11 DIAGNOSIS — Z8249 Family history of ischemic heart disease and other diseases of the circulatory system: Secondary | ICD-10-CM | POA: Diagnosis not present

## 2023-09-11 DIAGNOSIS — Z7984 Long term (current) use of oral hypoglycemic drugs: Secondary | ICD-10-CM

## 2023-09-11 DIAGNOSIS — R2681 Unsteadiness on feet: Secondary | ICD-10-CM | POA: Diagnosis present

## 2023-09-11 DIAGNOSIS — Z955 Presence of coronary angioplasty implant and graft: Secondary | ICD-10-CM

## 2023-09-11 DIAGNOSIS — Z6833 Body mass index (BMI) 33.0-33.9, adult: Secondary | ICD-10-CM | POA: Diagnosis not present

## 2023-09-11 DIAGNOSIS — Z833 Family history of diabetes mellitus: Secondary | ICD-10-CM

## 2023-09-11 DIAGNOSIS — I10 Essential (primary) hypertension: Secondary | ICD-10-CM | POA: Diagnosis present

## 2023-09-11 DIAGNOSIS — G4733 Obstructive sleep apnea (adult) (pediatric): Secondary | ICD-10-CM | POA: Diagnosis present

## 2023-09-11 DIAGNOSIS — K5900 Constipation, unspecified: Secondary | ICD-10-CM | POA: Diagnosis present

## 2023-09-11 DIAGNOSIS — E871 Hypo-osmolality and hyponatremia: Secondary | ICD-10-CM | POA: Diagnosis present

## 2023-09-11 DIAGNOSIS — Z7902 Long term (current) use of antithrombotics/antiplatelets: Secondary | ICD-10-CM

## 2023-09-11 DIAGNOSIS — E785 Hyperlipidemia, unspecified: Secondary | ICD-10-CM | POA: Diagnosis present

## 2023-09-11 DIAGNOSIS — I69351 Hemiplegia and hemiparesis following cerebral infarction affecting right dominant side: Secondary | ICD-10-CM | POA: Diagnosis present

## 2023-09-11 DIAGNOSIS — R29700 NIHSS score 0: Secondary | ICD-10-CM | POA: Diagnosis not present

## 2023-09-11 DIAGNOSIS — Z888 Allergy status to other drugs, medicaments and biological substances status: Secondary | ICD-10-CM

## 2023-09-11 DIAGNOSIS — Z7985 Long-term (current) use of injectable non-insulin antidiabetic drugs: Secondary | ICD-10-CM

## 2023-09-11 DIAGNOSIS — I69392 Facial weakness following cerebral infarction: Secondary | ICD-10-CM

## 2023-09-11 DIAGNOSIS — R17 Unspecified jaundice: Secondary | ICD-10-CM | POA: Diagnosis not present

## 2023-09-11 DIAGNOSIS — I6389 Other cerebral infarction: Secondary | ICD-10-CM

## 2023-09-11 DIAGNOSIS — I639 Cerebral infarction, unspecified: Secondary | ICD-10-CM | POA: Diagnosis not present

## 2023-09-11 DIAGNOSIS — E669 Obesity, unspecified: Secondary | ICD-10-CM | POA: Diagnosis present

## 2023-09-11 DIAGNOSIS — I152 Hypertension secondary to endocrine disorders: Secondary | ICD-10-CM | POA: Diagnosis present

## 2023-09-11 DIAGNOSIS — R7989 Other specified abnormal findings of blood chemistry: Secondary | ICD-10-CM | POA: Diagnosis present

## 2023-09-11 DIAGNOSIS — Z794 Long term (current) use of insulin: Secondary | ICD-10-CM

## 2023-09-11 DIAGNOSIS — I25810 Atherosclerosis of coronary artery bypass graft(s) without angina pectoris: Secondary | ICD-10-CM | POA: Diagnosis not present

## 2023-09-11 DIAGNOSIS — E66811 Obesity, class 1: Secondary | ICD-10-CM | POA: Diagnosis present

## 2023-09-11 DIAGNOSIS — R001 Bradycardia, unspecified: Secondary | ICD-10-CM | POA: Diagnosis present

## 2023-09-11 LAB — GLUCOSE, CAPILLARY
Glucose-Capillary: 121 mg/dL — ABNORMAL HIGH (ref 70–99)
Glucose-Capillary: 113 mg/dL — ABNORMAL HIGH (ref 70–99)
Glucose-Capillary: 99 mg/dL (ref 70–99)

## 2023-09-11 MED ORDER — ACETAMINOPHEN 325 MG PO TABS: 325.0000 mg | ORAL_TABLET | ORAL | Status: DC | PRN

## 2023-09-11 MED ORDER — PROCHLORPERAZINE 25 MG RE SUPP: 12.5000 mg | Freq: Four times a day (QID) | RECTAL | Status: DC | PRN

## 2023-09-11 MED ORDER — TRAZODONE HCL 50 MG PO TABS: 25.0000 mg | ORAL_TABLET | Freq: Every evening | ORAL | Status: DC | PRN

## 2023-09-11 MED ORDER — DULAGLUTIDE 1.5 MG/0.5ML ~~LOC~~ SOAJ: 1.5000 mg | SUBCUTANEOUS | Status: DC

## 2023-09-11 MED ORDER — BISACODYL 10 MG RE SUPP: 10.0000 mg | Freq: Every day | RECTAL | Status: DC | PRN

## 2023-09-11 MED ORDER — BISOPROLOL FUMARATE 5 MG PO TABS
2.5000 mg | ORAL_TABLET | Freq: Every day | ORAL | Status: DC
  Administered 2023-09-12 – 2023-09-16 (×5): 2.5 mg via ORAL
  Filled 2023-09-11 (×5): qty 0.5

## 2023-09-11 MED ORDER — SORBITOL 70 % SOLN
30.0000 mL | Freq: Every day | Status: DC | PRN
  Administered 2023-09-11: 30 mL via ORAL
  Filled 2023-09-11: qty 30

## 2023-09-11 MED ORDER — ENOXAPARIN SODIUM 40 MG/0.4ML IJ SOSY
40.0000 mg | PREFILLED_SYRINGE | INTRAMUSCULAR | Status: DC
Start: 2023-09-11 — End: 2023-09-13
  Administered 2023-09-11 – 2023-09-12 (×2): 40 mg via SUBCUTANEOUS
  Filled 2023-09-11 (×2): qty 0.4

## 2023-09-11 MED ORDER — PROCHLORPERAZINE MALEATE 5 MG PO TABS: 5.0000 mg | ORAL_TABLET | Freq: Four times a day (QID) | ORAL | Status: DC | PRN

## 2023-09-11 MED ORDER — FLEET ENEMA RE ENEM: 1.0000 | ENEMA | Freq: Once | RECTAL | Status: DC | PRN

## 2023-09-11 MED ORDER — SENNOSIDES-DOCUSATE SODIUM 8.6-50 MG PO TABS
2.0000 | ORAL_TABLET | Freq: Every evening | ORAL | Status: DC | PRN
Start: 2023-09-11 — End: 2023-09-16
  Administered 2023-09-11 – 2023-09-15 (×4): 2 via ORAL
  Filled 2023-09-11 (×4): qty 2

## 2023-09-11 MED ORDER — INSULIN ASPART 100 UNIT/ML IJ SOLN: 0.0000 [IU] | Freq: Three times a day (TID) | INTRAMUSCULAR | Status: DC

## 2023-09-11 MED ORDER — BISOPROLOL FUMARATE 2.5 MG PO TABS: 2.5000 mg | ORAL_TABLET | Freq: Every day | ORAL | Status: DC

## 2023-09-11 MED ORDER — INSULIN ASPART 100 UNIT/ML IJ SOLN: 0.0000 [IU] | Freq: Every day | INTRAMUSCULAR | Status: DC

## 2023-09-11 MED ORDER — ASPIRIN 81 MG PO TBEC
81.0000 mg | DELAYED_RELEASE_TABLET | Freq: Every day | ORAL | Status: AC
Start: 2023-09-12 — End: ?

## 2023-09-11 MED ORDER — GUAIFENESIN-DM 100-10 MG/5ML PO SYRP: 5.0000 mL | ORAL_SOLUTION | Freq: Four times a day (QID) | ORAL | Status: DC | PRN

## 2023-09-11 MED ORDER — ASPIRIN 81 MG PO TBEC
81.0000 mg | DELAYED_RELEASE_TABLET | Freq: Every day | ORAL | Status: DC
  Administered 2023-09-12 – 2023-09-16 (×5): 81 mg via ORAL
  Filled 2023-09-11 (×5): qty 1

## 2023-09-11 MED ORDER — DIPHENHYDRAMINE HCL 25 MG PO CAPS
25.0000 mg | ORAL_CAPSULE | Freq: Four times a day (QID) | ORAL | Status: DC | PRN
Start: 2023-09-11 — End: 2023-09-16

## 2023-09-11 MED ORDER — PROCHLORPERAZINE EDISYLATE 10 MG/2ML IJ SOLN: 5.0000 mg | Freq: Four times a day (QID) | INTRAMUSCULAR | Status: DC | PRN

## 2023-09-11 MED ORDER — ALUM & MAG HYDROXIDE-SIMETH 200-200-20 MG/5ML PO SUSP: 30.0000 mL | ORAL | Status: DC | PRN

## 2023-09-11 MED ORDER — INSULIN GLARGINE-YFGN 100 UNIT/ML ~~LOC~~ SOLN
10.0000 [IU] | Freq: Every day | SUBCUTANEOUS | Status: DC
  Administered 2023-09-11 – 2023-09-15 (×5): 10 [IU] via SUBCUTANEOUS
  Filled 2023-09-11 (×6): qty 0.1

## 2023-09-11 MED ORDER — SENNOSIDES-DOCUSATE SODIUM 8.6-50 MG PO TABS: 2.0000 | ORAL_TABLET | Freq: Every evening | ORAL | Status: DC | PRN

## 2023-09-11 MED ORDER — TICAGRELOR 90 MG PO TABS
90.0000 mg | ORAL_TABLET | Freq: Two times a day (BID) | ORAL | Status: DC
  Administered 2023-09-11 – 2023-09-16 (×10): 90 mg via ORAL
  Filled 2023-09-11 (×10): qty 1

## 2023-09-11 MED ORDER — TICAGRELOR 90 MG PO TABS: 90.0000 mg | ORAL_TABLET | Freq: Two times a day (BID) | ORAL | Status: DC

## 2023-09-11 MED ORDER — INSULIN ASPART 100 UNIT/ML IJ SOLN
0.0000 [IU] | Freq: Three times a day (TID) | INTRAMUSCULAR | Status: DC
  Administered 2023-09-12 – 2023-09-14 (×7): 1 [IU] via SUBCUTANEOUS
  Administered 2023-09-15: 2 [IU] via SUBCUTANEOUS
  Administered 2023-09-16: 1 [IU] via SUBCUTANEOUS

## 2023-09-11 MED ORDER — ROSUVASTATIN CALCIUM 20 MG PO TABS
40.0000 mg | ORAL_TABLET | Freq: Every day | ORAL | Status: DC
  Administered 2023-09-12 – 2023-09-15 (×4): 40 mg via ORAL
  Filled 2023-09-11 (×4): qty 2

## 2023-09-11 MED ORDER — EMPAGLIFLOZIN 25 MG PO TABS: 25.0000 mg | ORAL_TABLET | Freq: Every day | ORAL | Status: DC

## 2023-09-11 MED ORDER — EMPAGLIFLOZIN 25 MG PO TABS
25.0000 mg | ORAL_TABLET | Freq: Every day | ORAL | Status: DC
  Administered 2023-09-12 – 2023-09-16 (×5): 25 mg via ORAL
  Filled 2023-09-11 (×5): qty 1

## 2023-09-11 NOTE — H&P (Signed)
 Physical Medicine and Rehabilitation Admission H&P        Chief Complaint  Patient presents with   Functional deficits      HPI:  Tyler Hoffman is a 48 year old male with histoyr of T2DM, OSA- CPAP, lacunar infarct, CAD s/p DES, obesity-BMI 33 who was admitted on 09/08/23 with sudden onset of RLE and RUE weakness. CTA negative for LVO with advanced intracranial atherosclerosis.  CT head negative for bleed and patient received TNK. MRI brain done revealing 9 mm acute infarct in left paramedian pons. Dr. Rosemarie felt that stroke due to small vessel disease and recommended changing Plavix to Brilinta with DAPT X 4 week followed by Brilinta alone.    Patient had had bradycardia with HR in 30's and cardiology consulted for input as patient also reported DOE but no CP with three flights of stairs.  2D echo showed EF 60-65% with normal LVEF. No evidence of PAF and bradycardia felt to be in setting of stroke. Zebeta resumed and 2 week cardiac monitor placed on  07/01 with plans to review on outpatient basis after completion.  Patient has a cardiologist in [place].  Therapy consulted and patient with slightly unsteady gait with decreased weight shifting on RLE. No BM in few days. He currently requiring supervision with ADLs and Supervision to mod assist with activity.  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 He was independent and working PTA. CIR recommended due to functional decline.      Review of Systems  Constitutional:  Negative for chills and fever.  HENT:  Positive for congestion. Negative for hearing loss.   Eyes:  Negative for blurred vision, double vision and pain.  Respiratory:  Negative for cough and shortness of breath.   Cardiovascular:  Negative for chest pain and palpitations.  Gastrointestinal:  Positive for constipation. Negative for abdominal pain, diarrhea, nausea and vomiting.  Genitourinary: Negative.   Musculoskeletal:  Negative for back pain, joint  pain and neck pain.  Skin:  Negative for rash.  Neurological:  Positive for speech change and focal weakness. Negative for dizziness, sensory change and headaches.  Psychiatric/Behavioral:  Negative for depression.            Past Medical History:  Diagnosis Date   CAD S/P DES PCI-LAD (08/2020 - in [place]) 07/19/2020    CTA 07/11/2020 (performed in [place]): In response to abnormal functional study. Coronary Calcium  Score 0.3.  LAD and circumflex have separate ostia.  Appears to be occlusion of the LAD after SP1.  LCx gives off 1 major marginal branch with no significant disease.  RCA has diffuse atheroma with mod stenosis prior to Crux.  LVEF Nl Systolic Fxn. ->  Cath = DES PCI prox LAD SubTO, FFR Neg RCA.   Essential hypertension 05/22/2016   History of rosacea     Hyperlipidemia associated with type 2 diabetes mellitus (HCC) 08/22/2006    Qualifier: Diagnosis of  By: Baird FNP, Frieda Kipper    Lacunar infarction (HCC) 05/2016    No residual infarct   Obesity (BMI 30-39.9) 10/16/2013   OSA on CPAP      Uses CPAP   Type II diabetes mellitus with complication (HCC)      [place] CVA &  CAD- PCI LAD                 Past Surgical History:  Procedure Laterality Date   CORONARY STENT INTERVENTION   08/31/2020  [place], [place]: DES PCI LAD (subtotal occlusion ~ 7 mm, downstream of SP1); - Unknowns STENT Size/ Brand..   CT CTA CORONARY W/CA SCORE W/CM &/OR WO/CM   07/11/2020    Performed in [place] in response to abnormal functional study:             o             Coronary Calcium  Score 0.3.  LAD and circumflex have separate ostia.  Appears to be occlusion of the LAD after SP1.  LCx gives off 1 major marginal branch with no significant disease.  RCA has diffuse atheroma with moderate stenosis prior to the crux.  LVEF normal systolic function. ->  Coronary angiography recommended.   LEFT HEART CATH AND CORONARY ANGIOGRAPHY   08/31/2020    [place], [place]): Two-vessel CAD with subtotal  occlusion just distal to SP1.  Subcritical stenosis of the right coronary artery (IFR and FFR negative). => LAD PCI performed with good result.  (Right radial access)   nasoplasty   08/11/2003   TRANSTHORACIC ECHOCARDIOGRAM   06/07/2016    With bubble study for CVA evaluation: ([place]) normal LV function.  EF 66 5%.  No RWMA.  GR 1 DD.  No IAS, PFO or ASD noted.  No R-L shunt.  Normal valves.   TRANSTHORACIC ECHOCARDIOGRAM   12/2018    [place], [place]:ormal biventricular function.  EF 65%.  Normal wall motion.  No significant valvular disease.  No pleural effusion.  Normal aorta within the level of portable section.   TRANSTHORACIC ECHOCARDIOGRAM   08/2020    [place], [place]: Normal EF. evidence of hypertensive heart disease with normal biventricular function.  Normal aortic.  Normal valves.               Family History  Problem Relation Age of Onset   Heart attack Mother 76        PCI   Coronary artery disease Mother     Coronary artery disease Maternal Grandmother     Heart attack Maternal Grandmother     Diabetes Maternal Grandfather     Heart disease Paternal Grandmother     Heart disease Paternal Grandfather     Heart attack Paternal Grandfather     Coronary artery disease Paternal Grandfather 21 - 49   Coronary artery disease Paternal Uncle 78 - 102        CABG          Social History:  Married. 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  He  reports that he has never smoked. He has never used smokeless tobacco. He reports current alcohol use. He reports that he does not use drugs.     Allergies       Allergies  Allergen Reactions   Metformin Diarrhea   Metformin And Related Diarrhea   Pseudoephedrine Hypertension   Sulfa Antibiotics Diarrhea   Septra [Sulfamethoxazole-Trimethoprim] Diarrhea, Swelling and Rash   Sulfamethoxazole-Trimethoprim Swelling and Rash      Red all over, also   Sulfonamide Derivatives Swelling and Rash            Medications Prior to  Admission  Medication Sig Dispense Refill   clopidogrel (PLAVIX) 75 MG tablet Take 1 tablet by mouth daily.       Dulaglutide  (TRULICITY ) 1.5 MG/0.5ML SOPN Inject 1.5 mg into the skin once a week. 6 mL 0   empagliflozin  (JARDIANCE ) 25 MG TABS tablet Take 25 mg by mouth daily.  rosuvastatin  (CRESTOR ) 40 MG tablet Take 1 tablet (40 mg total) by mouth daily. (Patient taking differently: Take 40 mg by mouth at bedtime.) 90 tablet 0   tadalafil (CIALIS) 5 MG tablet Take 5 mg by mouth daily.       TRESIBA  FLEXTOUCH 100 UNIT/ML FlexTouch Pen INJECT 60 UNITS INTO THE SKIN DAILY (Patient taking differently: Inject 60 Units into the skin at bedtime.) 15 mL 1   bisoprolol (ZEBETA) 5 MG tablet Take 2.5 mg by mouth daily. (Patient not taking: Reported on 09/08/2023)       ezetimibe  (ZETIA ) 10 MG tablet Take 1 tablet (10 mg total) by mouth daily. (Patient not taking: Reported on 09/08/2023) 90 tablet 0   glucose blood (ONETOUCH VERIO) test strip Use to check blood sugar 4 times a day.  Dx: E11.65. 120 each 11   Insulin  Pen Needle (PEN NEEDLES) 30G X 8 MM MISC Inject 10 Units into the skin daily. Pen needles for Levemir  Flex Pen 100 each 11   lisinopril  (ZESTRIL ) 20 MG tablet Take 1 tablet (20 mg total) by mouth daily. (Patient not taking: Reported on 09/08/2023) 90 tablet 0              Home: Home Living Family/patient expects to be discharged to:: Private residence Living Arrangements: Spouse/significant other, Children Available Help at Discharge: Family Type of Home: House Home Access: Ramped entrance Home Layout: One level Bathroom Shower/Tub: Artist: None Additional Comments: In [place] has 3 floors, no steps to enter, walk in shower  Lives With: Spouse, Family   Functional History: Prior Function Prior Level of Function : Independent/Modified Independent, Working/employed, Driving Mobility Comments: no AD use ADLs Comments: ind   Functional Status:   Mobility: Bed Mobility Overal bed mobility: Needs Assistance Bed Mobility: Supine to Sit Supine to sit: Supervision Sit to supine: Supervision General bed mobility comments: in recliner upon arrival Transfers Overall transfer level: Needs assistance Equipment used: None Transfers: Sit to/from Stand Sit to Stand: Supervision General transfer comment: supervision for safety Ambulation/Gait Ambulation/Gait assistance: Min assist, Contact guard assist, Mod assist Gait Distance (Feet): 100 Feet (x3) Assistive device: None Gait Pattern/deviations: Step-through pattern, Decreased stride length, Decreased dorsiflexion - right, Decreased weight shift to right General Gait Details: slightly unsteady gait with decreased weight shift to R LE Gait velocity: decr Gait velocity interpretation: 1.31 - 2.62 ft/sec, indicative of limited community ambulator   ADL: ADL Overall ADL's : Needs assistance/impaired Eating/Feeding: Set up, Sitting Grooming: Set up, Standing Grooming Details (indicate cue type and reason): shaves face standing in front of mirror Upper Body Bathing: Contact guard assist, Sitting Lower Body Bathing: Contact guard assist, Sitting/lateral leans Upper Body Dressing : Contact guard assist, Sitting Lower Body Dressing: Contact guard assist, Sitting/lateral leans Toilet Transfer: Supervision/safety, Contact guard assist Toilet Transfer Details (indicate cue type and reason): holds on to bedrail/countertop with unilateral UE support with short distance mobility Toileting- Clothing Manipulation and Hygiene: Contact guard assist Functional mobility during ADLs: Supervision/safety   Cognition: Cognition Overall Cognitive Status: Within Functional Limits for tasks assessed Arousal/Alertness: Awake/alert Orientation Level: Oriented X4 Cognition Arousal: Alert Behavior During Therapy: WFL for tasks assessed/performed Overall Cognitive Status: Within Functional Limits for tasks  assessed   Physical Exam: Blood pressure 113/75, pulse 73, temperature 98.3 F (36.8 C), temperature source Oral, resp. rate 19, height 5' 3 (1.6 m), weight 84.9 kg, SpO2 94%. Physical Exam   General: No apparent distress HEENT: Head is normocephalic, atraumatic, sclera anicteric, oral mucosa  pink and moist, dentition intact Neck: Supple without JVD or lymphadenopathy Heart: Reg rate and rhythm. No murmurs rubs or gallops, cardiac monitor L chest Chest: CTA bilaterally without wheezes, rales, or rhonchi; no distress Abdomen: Soft, non-tender, non-distended, bowel sounds positive. Extremities: No clubbing, cyanosis, or edema. Pulses are 2+ Psych: Pt's affect is appropriate. Pt is cooperative. Pleasant  Skin: Clean and intact without signs of breakdown Neuro:     Mental Status: AAOx3, memory intact, fund of knowledge appropriate, 3/3 words recalled after 5 minutes Speech/Languate: Naming and repetition intact, fluent, follows commands CRANIAL NERVES: II: PERRL. Visual fields full III, IV, VI: EOM intact, no gaze preference or deviation V: normal sensation bilaterally VII: R facial weakness VIII: normal hearing to speech IX, X: normal palatal elevation XI: 5/5 head turn and 5/5 shoulder shrug bilaterally XII: Tongue a little to right     MOTOR: RUE: 4/5 Deltoid, 4/5 Biceps, 4+/5 Triceps,4/5 Grip LUE: 5/5 Deltoid, 5/5 Biceps, 5/5 Triceps, 5/5 Grip RLE: HF 4/5, KE 4/5, ADF 4+/5, APF 4+/5 LLE: HF 5/5, KE 5/5, ADF 5/5, APF 5/5   RUE drift   REFLEXES: no ankle clonus   SENSORY: Normal to touch all 4 extremities   No hypertonia noted   Coordination: Normal finger to nose and heel to shin, no tremor, no dysmetria       Lab Results Last 48 Hours        Results for orders placed or performed during the hospital encounter of 09/08/23 (from the past 48 hours)  Glucose, capillary     Status: Abnormal    Collection Time: 09/09/23  4:41 PM  Result Value Ref Range     Glucose-Capillary 130 (H) 70 - 99 mg/dL      Comment: Glucose reference range applies only to samples taken after fasting for at least 8 hours.  Glucose, capillary     Status: Abnormal    Collection Time: 09/09/23 10:03 PM  Result Value Ref Range    Glucose-Capillary 164 (H) 70 - 99 mg/dL      Comment: Glucose reference range applies only to samples taken after fasting for at least 8 hours.  CBC     Status: None    Collection Time: 09/10/23  6:09 AM  Result Value Ref Range    WBC 8.2 4.0 - 10.5 K/uL    RBC 4.90 4.22 - 5.81 MIL/uL    Hemoglobin 15.8 13.0 - 17.0 g/dL    HCT 53.4 60.9 - 47.9 %    MCV 94.9 80.0 - 100.0 fL    MCH 32.2 26.0 - 34.0 pg    MCHC 34.0 30.0 - 36.0 g/dL    RDW 87.4 88.4 - 84.4 %    Platelets 231 150 - 400 K/uL    nRBC 0.0 0.0 - 0.2 %      Comment: Performed at [facility], [address] [place], [place] [address]  Basic metabolic panel with GFR     Status: Abnormal    Collection Time: 09/10/23  6:09 AM  Result Value Ref Range    Sodium 137 135 - 145 mmol/L    Potassium 4.0 3.5 - 5.1 mmol/L    Chloride 106 98 - 111 mmol/L    CO2 22 22 - 32 mmol/L    Glucose, Bld 136 (H) 70 - 99 mg/dL      Comment: Glucose reference range applies only to samples taken after fasting for at least 8 hours.    BUN 22 (H)  6 - 20 mg/dL    Creatinine, Ser 9.18 0.61 - 1.24 mg/dL    Calcium  8.7 (L) 8.9 - 10.3 mg/dL    GFR, Estimated >39 >39 mL/min      Comment: (NOTE) Calculated using the CKD-EPI Creatinine Equation (2021)      Anion gap 9 5 - 15      Comment: Performed at [facility], [address], [place], [place] [address]  Glucose, capillary     Status: Abnormal    Collection Time: 09/10/23  7:43 AM  Result Value Ref Range    Glucose-Capillary 144 (H) 70 - 99 mg/dL      Comment: Glucose reference range applies only to samples taken after fasting for at least 8 hours.  Glucose, capillary     Status: Abnormal    Collection Time: 09/10/23 11:38 AM  Result  Value Ref Range    Glucose-Capillary 100 (H) 70 - 99 mg/dL      Comment: Glucose reference range applies only to samples taken after fasting for at least 8 hours.  Glucose, capillary     Status: Abnormal    Collection Time: 09/10/23  4:27 PM  Result Value Ref Range    Glucose-Capillary 125 (H) 70 - 99 mg/dL      Comment: Glucose reference range applies only to samples taken after fasting for at least 8 hours.    Comment 1 Notify RN      Comment 2 Document in Chart    Glucose, capillary     Status: Abnormal    Collection Time: 09/10/23  9:29 PM  Result Value Ref Range    Glucose-Capillary 144 (H) 70 - 99 mg/dL      Comment: Glucose reference range applies only to samples taken after fasting for at least 8 hours.    Comment 1 Notify RN    Glucose, capillary     Status: Abnormal    Collection Time: 09/11/23  6:34 AM  Result Value Ref Range    Glucose-Capillary 121 (H) 70 - 99 mg/dL      Comment: Glucose reference range applies only to samples taken after fasting for at least 8 hours.    Comment 1 Notify RN    Glucose, capillary     Status: Abnormal    Collection Time: 09/11/23 11:34 AM  Result Value Ref Range    Glucose-Capillary 113 (H) 70 - 99 mg/dL      Comment: Glucose reference range applies only to samples taken after fasting for at least 8 hours.    Comment 1 Notify RN      Comment 2 Document in Chart         Imaging Results (Last 48 hours)  ECHOCARDIOGRAM COMPLETE Result Date: 09/09/2023    ECHOCARDIOGRAM REPORT   Patient Name:   Tyler Hoffman Date of Exam: 09/09/2023 Medical Rec #:  [id number]   Height:       63.0 in Accession #:    [id number]  Weight:       200.0 lb Date of Birth:  Jan 25, 1976    BSA:          1.934 m Patient Age:    47 years    BP:           140/80 mmHg Patient Gender: M           HR:           77 bpm. Exam Location:  Inpatient Procedure: 2D Echo, Cardiac Doppler and  Color Doppler (Both Spectral and Color            Flow Doppler were utilized during procedure).  Indications:    Stroke I63.9  History:        Patient has prior history of Echocardiogram examinations, most                 recent 06/07/2016. CAD, Stroke; Risk Factors:Hypertension,                 Diabetes, Dyslipidemia and Sleep Apnea.  Sonographer:    Thea Norlander RCS Referring Phys: [id number] ASHISH ARORA IMPRESSIONS  1. Left ventricular ejection fraction, by estimation, is 60 to 65%. The left ventricle has normal function. The left ventricle has no regional wall motion abnormalities. Left ventricular diastolic parameters were normal.  2. Right ventricular systolic function is normal. The right ventricular size is normal.  3. The mitral valve is normal in structure. No evidence of mitral valve regurgitation. No evidence of mitral stenosis.  4. The aortic valve is normal in structure. There is mild calcification of the aortic valve. Aortic valve regurgitation is not visualized. No aortic stenosis is present.  5. The inferior vena cava is normal in size with greater than 50% respiratory variability, suggesting right atrial pressure of 3 mmHg. FINDINGS  Left Ventricle: Left ventricular ejection fraction, by estimation, is 60 to 65%. The left ventricle has normal function. The left ventricle has no regional wall motion abnormalities. The left ventricular internal cavity size was normal in size. There is  no left ventricular hypertrophy. Left ventricular diastolic parameters were normal. Right Ventricle: The right ventricular size is normal. No increase in right ventricular wall thickness. Right ventricular systolic function is normal. Left Atrium: Left atrial size was normal in size. Right Atrium: Right atrial size was normal in size. Pericardium: There is no evidence of pericardial effusion. Mitral Valve: The mitral valve is normal in structure. No evidence of mitral valve regurgitation. No evidence of mitral valve stenosis. Tricuspid Valve: The tricuspid valve is normal in structure. Tricuspid valve  regurgitation is not demonstrated. No evidence of tricuspid stenosis. Aortic Valve: The aortic valve is normal in structure. There is mild calcification of the aortic valve. Aortic valve regurgitation is not visualized. No aortic stenosis is present. Aortic valve peak gradient measures 4.9 mmHg. Pulmonic Valve: The pulmonic valve was normal in structure. Pulmonic valve regurgitation is not visualized. No evidence of pulmonic stenosis. Aorta: The aortic root is normal in size and structure. Venous: The inferior vena cava is normal in size with greater than 50% respiratory variability, suggesting right atrial pressure of 3 mmHg. IAS/Shunts: No atrial level shunt detected by color flow Doppler.  LEFT VENTRICLE PLAX 2D LVIDd:         4.60 cm   Diastology LVIDs:         3.10 cm   LV e' medial:    12.60 cm/s LV PW:         0.90 cm   LV E/e' medial:  5.5 LV IVS:        0.90 cm   LV e' lateral:   13.20 cm/s LVOT diam:     2.20 cm   LV E/e' lateral: 5.2 LV SV:         63 LV SV Index:   33 LVOT Area:     3.80 cm  RIGHT VENTRICLE RV S prime:     14.50 cm/s TAPSE (M-mode): 2.3 cm LEFT ATRIUM  Index        RIGHT ATRIUM           Index LA diam:        3.80 cm 1.97 cm/m   RA Area:     17.00 cm LA Vol (A2C):   33.0 ml 17.09 ml/m  RA Volume:   45.70 ml  23.63 ml/m LA Vol (A4C):   55.2 ml 28.55 ml/m LA Biplane Vol: 46.6 ml 24.10 ml/m  AORTIC VALVE AV Area (Vmax): 2.92 cm AV Vmax:        111.00 cm/s AV Peak Grad:   4.9 mmHg LVOT Vmax:      85.40 cm/s LVOT Vmean:     55.000 cm/s LVOT VTI:       0.166 m  AORTA Ao Root diam: 3.20 cm Ao Asc diam:  2.90 cm MITRAL VALVE MV Area (PHT): 3.85 cm    SHUNTS MV Decel Time: 197 msec    Systemic VTI:  0.17 m MV E velocity: 68.70 cm/s  Systemic Diam: 2.20 cm MV A velocity: 69.70 cm/s MV E/A ratio:  0.99 Aditya Sabharwal Electronically signed by Ria Commander Signature Date/Time: 09/09/2023/5:42:03 PM    Final     MR BRAIN WO CONTRAST Result Date: 09/09/2023 CLINICAL DATA:   Stroke follow-up. EXAM: MRI HEAD WITHOUT CONTRAST TECHNIQUE: Multiplanar, multiecho pulse sequences of the brain and surrounding structures were obtained without intravenous contrast. COMPARISON:  CT head and CTA head and neck 09/08/2023. FINDINGS: Brain: 9 mm focus of restricted diffusion in the left para median pons at the level of the middle cerebellar peduncles. No evidence of intracranial hemorrhage. T2/FLAIR hyperintensity in the periventricular and subcortical white matter. Remote lacunar infarcts in the bilateral basal ganglia. Additional areas likely reflecting prominent perivascular spaces in the inferior aspect of both basal ganglia. No edema, mass effect, or midline shift. The basilar cisterns are patent. No extra-axial fluid collections. Ventricles: Normal size and configuration of the ventricles. Vascular: Skull base flow voids are visualized. Skull and upper cervical spine: No focal abnormality. Sinuses/Orbits: Orbits are symmetric. Mucous retention cyst in the left maxillary sinus. Other: Mastoid air cells are clear. IMPRESSION: 9 mm focus of acute infarct in the left paramedian pons. Mild chronic microvascular ischemic changes. Remote lacunar infarcts in the bilateral basal ganglia. Electronically Signed   By: Donnice Mania M.D.   On: 09/09/2023 14:08         Blood pressure 113/75, pulse 73, temperature 98.3 F (36.8 C), temperature source Oral, resp. rate 19, height 5' 3 (1.6 m), weight 84.9 kg, SpO2 94%.   Medical Problem List and Plan: 1. Functional deficits secondary to left paramedian pontine CVA s/p TNK             -patient may  shower             -ELOS/Goals: PT, OT modified to independent, 4 to 6 days             -Admit to CIR 2.  Antithrombotics: -DVT/anticoagulation:  Pharmaceutical: Lovenox added             -antiplatelet therapy: ASA/Brilinta X 4 weeks followed by Brilinta alone.  3. Pain Management: Tylenol prn.  4. Mood/Behavior/Sleep: LCSW to follow for evaluation and  support             -antipsychotic agents: N/A 5. Neuropsych/cognition: This patient is capable of making decisions on his own behalf. 6. Skin/Wound Care: Routine pressure relief measures.  7. Fluids/Electrolytes/Nutrition: Monitor I/O. Check  CMET in am 8.  HTN: Monitor BP TID--was on Bisoprolol and Lisinopril .  BP goal less than 180/105 --Continues on low dose Bisoprolol. Has had HR improving.  9. T2DM: Hgb A1c-8.1. Was on tresiba  60 units, trulicity  and jardiance  at home--compliance?             --BS relatively ranging from 100-160. Will resume insulin  glargine at 10 units HS             --encourage fluid intake as on Jardiance  and BUN trending up.  10. Pre-renal azotemia: BUN- 22. Encourage fluid intake.  Recheck labs 11. OSA: Has been compliant with CPAP use.  12. Dyslipidemia: Trig-155/LDL-98.  Statin 13. Obesity class I: Educated on diet and exercise to promote health and weight loss             Body mass index is 33.16 kg/m. 14. CAD DES to LAD 07/11/20.  Continue ASA/Brilinta as above 15. Constipation: PRN senokot, PRN sorbitol       Pamela S Love, PA-C 09/11/2023   I have personally performed a face to face diagnostic evaluation of this patient and formulated the key components of the plan.  Additionally, I have personally reviewed laboratory data, imaging studies, as well as relevant notes and concur with the physician assistant's documentation above.   The patient's status has not changed from the original H&P.  Any changes in documentation from the acute care chart have been noted above.   Murray Collier, MD, [facility]

## 2023-09-11 NOTE — Progress Notes (Signed)
 Physical Therapy Treatment Patient Details Name: Tyler Hoffman MRN: [id number] DOB: 15-Nov-1975 Today's Date: 09/11/2023   History of Present Illness 48 y.o. male presented 09/08/23 for evaluation of sudden onset of right leg and arm weakness. NIHSS=2; received TNK; bradycardia to 30s; Cardiology consult likely vagal episode;  CT head negative  CTA mod stenosis L MCA post m3, mod stenosis of L vertrbal distal M4 segment    PMH CAD, lacunar infarcts on plavix, CAD, HTN, DM2,   PT Comments  Pt making steady progress towards acute PT goals and eager for mobility. Worked on dynamic balance, gait training, and LE strength in today's session. Pt had increased difficulty with anterior steps over obstacles and forward step-ups requiring MinA/ModA for steadying assistance. Pt demonstrated improvement in session by progressing from taking multiple steps to recover from LOB to one large step. Pt noted decreased preferred gait speed with increased difficulty changing gait speed. Continue to recommend >3hrs post acute rehab to maximize rehab potential. Acute PT to follow.    If plan is discharge home, recommend the following: A little help with walking and/or transfers;Assistance with cooking/housework;Assist for transportation;Help with stairs or ramp for entrance   Can travel by private vehicle      Yes  Equipment Recommendations  Rolling walker (2 wheels)    Recommendations for Other Services Rehab consult     Precautions / Restrictions Precautions Precautions: Fall Restrictions Weight Bearing Restrictions Per Provider Order: No     Mobility  Bed Mobility    General bed mobility comments: in recliner upon arrival    Transfers Overall transfer level: Needs assistance Equipment used: None Transfers: Sit to/from Stand Sit to Stand: Supervision    General transfer comment: supervision for safety    Ambulation/Gait Ambulation/Gait assistance: Min assist, Contact guard assist, Mod assist Gait  Distance (Feet): 100 Feet (x3) Assistive device: None Gait Pattern/deviations: Step-through pattern, Decreased stride length, Decreased dorsiflexion - right, Decreased weight shift to right Gait velocity: decr     General Gait Details: slightly unsteady gait with decreased weight shift to R LE     Modified Rankin (Stroke Patients Only) Modified Rankin (Stroke Patients Only) Pre-Morbid Rankin Score: No symptoms Modified Rankin: Moderately severe disability     Balance Overall balance assessment: Needs assistance Sitting-balance support: Feet supported Sitting balance-Leahy Scale: Normal     Standing balance support: Single extremity supported Standing balance-Leahy Scale: Fair  High level balance activites: Side stepping, Backward walking, Turns, Sudden stops High Level Balance Comments: x51ft fast/slow/stop, x2 reps; x10 ft backwards walking x2 reps       Communication Communication Communication: No apparent difficulties  Cognition Arousal: Alert Behavior During Therapy: WFL for tasks assessed/performed   PT - Cognitive impairments: No apparent impairments    Following commands: Intact      Cueing Cueing Techniques: Verbal cues  Exercises Other Exercises Other Exercises: B x5 forward and lateral step over obstacle, ModA Other Exercises: B x10 forward and lateral step-ups    General Comments General comments (skin integrity, edema, etc.): VSS on RA      Pertinent Vitals/Pain Pain Assessment Pain Assessment: No/denies pain     PT Goals (current goals can now be found in the care plan section) Acute Rehab PT Goals PT Goal Formulation: With patient Time For Goal Achievement: 09/23/23 Potential to Achieve Goals: Good Progress towards PT goals: Progressing toward goals    Frequency    Min 3X/week       AM-PAC PT 6 Clicks Mobility  Outcome Measure  Help needed turning from your back to your side while in a flat bed without using bedrails?:  None Help needed moving from lying on your back to sitting on the side of a flat bed without using bedrails?: A Little Help needed moving to and from a bed to a chair (including a wheelchair)?: A Little Help needed standing up from a chair using your arms (e.g., wheelchair or bedside chair)?: A Little Help needed to walk in hospital room?: A Little Help needed climbing 3-5 steps with a railing? : A Little 6 Click Score: 19    End of Session Equipment Utilized During Treatment: Gait belt Activity Tolerance: Patient tolerated treatment well Patient left: in chair;with call bell/phone within reach Nurse Communication: Mobility status PT Visit Diagnosis: Other abnormalities of gait and mobility (R26.89)     Time: 9172-9149 PT Time Calculation (min) (ACUTE ONLY): 23 min  Charges:    $Gait Training: 8-22 mins $Therapeutic Activity: 8-22 mins PT General Charges $$ ACUTE PT VISIT: 1 Visit                    Kate ORN, PT, DPT Secure Chat Preferred  Rehab Office [phone]   Kate BRAVO Tyler Hoffman 09/11/2023, 10:01 AM

## 2023-09-11 NOTE — H&P (Signed)
 Physical Medicine and Rehabilitation Admission H&P    Chief Complaint  Patient presents with   Functional deficits    HPI:  Tyler Hoffman is a 48 year old male with histoyr of T2DM, OSA- CPAP, lacunar infarct, CAD s/p DES, obesity-BMI 33 who was admitted on 09/08/23 with sudden onset of RLE and RUE weakness. CTA negative for LVO with advanced intracranial atherosclerosis.  CT head negative for bleed and patient received TNK. MRI brain done revealing 9 mm acute infarct in left paramedian pons. Dr. Rosemarie felt that stroke due to small vessel disease and recommended changing Plavix to Brilinta with DAPT X 4 week followed by Brilinta alone.   Patient had had bradycardia with HR in 30's and cardiology consulted for input as patient also reported DOE but no CP with three flights of stairs.  2D echo showed EF 60-65% with normal LVEF. No evidence of PAF and bradycardia felt to be in setting of stroke. Zebeta resumed and 2 week cardiac monitor placed on  07/01 with plans to review on outpatient basis after completion.  Patient has a cardiologist in [place].  Therapy consulted and patient with slightly unsteady gait with decreased weight shifting on RLE. No BM in few days. He currently requiring supervision with ADLs and Supervision to mod assist with activity.  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 He was independent and working PTA. CIR recommended due to functional decline.    Review of Systems  Constitutional:  Negative for chills and fever.  HENT:  Positive for congestion. Negative for hearing loss.   Eyes:  Negative for blurred vision, double vision and pain.  Respiratory:  Negative for cough and shortness of breath.   Cardiovascular:  Negative for chest pain and palpitations.  Gastrointestinal:  Positive for constipation. Negative for abdominal pain, diarrhea, nausea and vomiting.  Genitourinary: Negative.   Musculoskeletal:  Negative for back pain, joint pain and  neck pain.  Skin:  Negative for rash.  Neurological:  Positive for speech change and focal weakness. Negative for dizziness, sensory change and headaches.  Psychiatric/Behavioral:  Negative for depression.      Past Medical History:  Diagnosis Date   CAD S/P DES PCI-LAD (08/2020 - in [place]) 07/19/2020   CTA 07/11/2020 (performed in [place]): In response to abnormal functional study. Coronary Calcium  Score 0.3.  LAD and circumflex have separate ostia.  Appears to be occlusion of the LAD after SP1.  LCx gives off 1 major marginal branch with no significant disease.  RCA has diffuse atheroma with mod stenosis prior to Crux.  LVEF Nl Systolic Fxn. ->  Cath = DES PCI prox LAD SubTO, FFR Neg RCA.   Essential hypertension 05/22/2016   History of rosacea    Hyperlipidemia associated with type 2 diabetes mellitus (HCC) 08/22/2006   Qualifier: Diagnosis of  By: Baird FNP, Frieda Kipper    Lacunar infarction (HCC) 05/2016   No residual infarct   Obesity (BMI 30-39.9) 10/16/2013   OSA on CPAP    Uses CPAP   Type II diabetes mellitus with complication (HCC)    [place] CVA &  CAD- PCI LAD     Past Surgical History:  Procedure Laterality Date   CORONARY STENT INTERVENTION  08/31/2020   [place], [place]: DES PCI LAD (subtotal occlusion ~ 7 mm, downstream of SP1); - Unknowns STENT Size/ Brand..   CT CTA CORONARY W/CA SCORE W/CM &/OR WO/CM  07/11/2020   Performed in [place] in response to abnormal  functional study: o Coronary Calcium  Score 0.3.  LAD and circumflex have separate ostia.  Appears to be occlusion of the LAD after SP1.  LCx gives off 1 major marginal branch with no significant disease.  RCA has diffuse atheroma with moderate stenosis prior to the crux.  LVEF normal systolic function. ->  Coronary angiography recommended.   LEFT HEART CATH AND CORONARY ANGIOGRAPHY  08/31/2020   [place], [place]): Two-vessel CAD with subtotal occlusion just distal to SP1.  Subcritical stenosis of the right  coronary artery (IFR and FFR negative). => LAD PCI performed with good result.  (Right radial access)   nasoplasty  08/11/2003   TRANSTHORACIC ECHOCARDIOGRAM  06/07/2016   With bubble study for CVA evaluation: ([place]) normal LV function.  EF 66 5%.  No RWMA.  GR 1 DD.  No IAS, PFO or ASD noted.  No R-L shunt.  Normal valves.   TRANSTHORACIC ECHOCARDIOGRAM  12/2018   [place], [place]:ormal biventricular function.  EF 65%.  Normal wall motion.  No significant valvular disease.  No pleural effusion.  Normal aorta within the level of portable section.   TRANSTHORACIC ECHOCARDIOGRAM  08/2020   [place], [place]: Normal EF. evidence of hypertensive heart disease with normal biventricular function.  Normal aortic.  Normal valves.    Family History  Problem Relation Age of Onset   Heart attack Mother 22       PCI   Coronary artery disease Mother    Coronary artery disease Maternal Grandmother    Heart attack Maternal Grandmother    Diabetes Maternal Grandfather    Heart disease Paternal Grandmother    Heart disease Paternal Grandfather    Heart attack Paternal Grandfather    Coronary artery disease Paternal Grandfather 17 - 49   Coronary artery disease Paternal Uncle 26 - 37       CABG    Social History:  Married. 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  He  reports that he has never smoked. He has never used smokeless tobacco. He reports current alcohol use. He reports that he does not use drugs.   Allergies  Allergen Reactions   Metformin Diarrhea   Metformin And Related Diarrhea   Pseudoephedrine Hypertension   Sulfa Antibiotics Diarrhea   Septra [Sulfamethoxazole-Trimethoprim] Diarrhea, Swelling and Rash   Sulfamethoxazole-Trimethoprim Swelling and Rash    Red all over, also   Sulfonamide Derivatives Swelling and Rash   Medications Prior to Admission  Medication Sig Dispense Refill   clopidogrel (PLAVIX) 75 MG tablet Take 1 tablet by mouth daily.     Dulaglutide  (TRULICITY )  1.5 MG/0.5ML SOPN Inject 1.5 mg into the skin once a week. 6 mL 0   empagliflozin  (JARDIANCE ) 25 MG TABS tablet Take 25 mg by mouth daily.     rosuvastatin  (CRESTOR ) 40 MG tablet Take 1 tablet (40 mg total) by mouth daily. (Patient taking differently: Take 40 mg by mouth at bedtime.) 90 tablet 0   tadalafil (CIALIS) 5 MG tablet Take 5 mg by mouth daily.     TRESIBA  FLEXTOUCH 100 UNIT/ML FlexTouch Pen INJECT 60 UNITS INTO THE SKIN DAILY (Patient taking differently: Inject 60 Units into the skin at bedtime.) 15 mL 1   bisoprolol (ZEBETA) 5 MG tablet Take 2.5 mg by mouth daily. (Patient not taking: Reported on 09/08/2023)     ezetimibe  (ZETIA ) 10 MG tablet Take 1 tablet (10 mg total) by mouth daily. (Patient not taking: Reported on 09/08/2023) 90 tablet 0   glucose blood (ONETOUCH  VERIO) test strip Use to check blood sugar 4 times a day.  Dx: E11.65. 120 each 11   Insulin  Pen Needle (PEN NEEDLES) 30G X 8 MM MISC Inject 10 Units into the skin daily. Pen needles for Levemir  Flex Pen 100 each 11   lisinopril  (ZESTRIL ) 20 MG tablet Take 1 tablet (20 mg total) by mouth daily. (Patient not taking: Reported on 09/08/2023) 90 tablet 0      Home: Home Living Family/patient expects to be discharged to:: Private residence Living Arrangements: Spouse/significant other, Children Available Help at Discharge: Family Type of Home: House Home Access: Ramped entrance Home Layout: One level Bathroom Shower/Tub: Artist: None Additional Comments: In [place] has 3 floors, no steps to enter, walk in shower  Lives With: Spouse, Family   Functional History: Prior Function Prior Level of Function : Independent/Modified Independent, Working/employed, Driving Mobility Comments: no AD use ADLs Comments: ind  Functional Status:  Mobility: Bed Mobility Overal bed mobility: Needs Assistance Bed Mobility: Supine to Sit Supine to sit: Supervision Sit to supine: Supervision General bed mobility  comments: in recliner upon arrival Transfers Overall transfer level: Needs assistance Equipment used: None Transfers: Sit to/from Stand Sit to Stand: Supervision General transfer comment: supervision for safety Ambulation/Gait Ambulation/Gait assistance: Min assist, Contact guard assist, Mod assist Gait Distance (Feet): 100 Feet (x3) Assistive device: None Gait Pattern/deviations: Step-through pattern, Decreased stride length, Decreased dorsiflexion - right, Decreased weight shift to right General Gait Details: slightly unsteady gait with decreased weight shift to R LE Gait velocity: decr Gait velocity interpretation: 1.31 - 2.62 ft/sec, indicative of limited community ambulator    ADL: ADL Overall ADL's : Needs assistance/impaired Eating/Feeding: Set up, Sitting Grooming: Set up, Standing Grooming Details (indicate cue type and reason): shaves face standing in front of mirror Upper Body Bathing: Contact guard assist, Sitting Lower Body Bathing: Contact guard assist, Sitting/lateral leans Upper Body Dressing : Contact guard assist, Sitting Lower Body Dressing: Contact guard assist, Sitting/lateral leans Toilet Transfer: Supervision/safety, Contact guard assist Toilet Transfer Details (indicate cue type and reason): holds on to bedrail/countertop with unilateral UE support with short distance mobility Toileting- Clothing Manipulation and Hygiene: Contact guard assist Functional mobility during ADLs: Supervision/safety  Cognition: Cognition Overall Cognitive Status: Within Functional Limits for tasks assessed Arousal/Alertness: Awake/alert Orientation Level: Oriented X4 Cognition Arousal: Alert Behavior During Therapy: WFL for tasks assessed/performed Overall Cognitive Status: Within Functional Limits for tasks assessed  Physical Exam: Blood pressure 113/75, pulse 73, temperature 98.3 F (36.8 C), temperature source Oral, resp. rate 19, height 5' 3 (1.6 m), weight 84.9 kg,  SpO2 94%. Physical Exam  General: No apparent distress HEENT: Head is normocephalic, atraumatic, sclera anicteric, oral mucosa pink and moist, dentition intact Neck: Supple without JVD or lymphadenopathy Heart: Reg rate and rhythm. No murmurs rubs or gallops, cardiac monitor L chest Chest: CTA bilaterally without wheezes, rales, or rhonchi; no distress Abdomen: Soft, non-tender, non-distended, bowel sounds positive. Extremities: No clubbing, cyanosis, or edema. Pulses are 2+ Psych: Pt's affect is appropriate. Pt is cooperative. Pleasant  Skin: Clean and intact without signs of breakdown Neuro:    Mental Status: AAOx3, memory intact, fund of knowledge appropriate, 3/3 words recalled after 5 minutes Speech/Languate: Naming and repetition intact, fluent, follows commands CRANIAL NERVES: II: PERRL. Visual fields full III, IV, VI: EOM intact, no gaze preference or deviation V: normal sensation bilaterally VII: R facial weakness VIII: normal hearing to speech IX, X: normal palatal elevation XI: 5/5 head turn and 5/5  shoulder shrug bilaterally XII: Tongue a little to right   MOTOR: RUE: 4/5 Deltoid, 4/5 Biceps, 4+/5 Triceps,4/5 Grip LUE: 5/5 Deltoid, 5/5 Biceps, 5/5 Triceps, 5/5 Grip RLE: HF 4/5, KE 4/5, ADF 4+/5, APF 4+/5 LLE: HF 5/5, KE 5/5, ADF 5/5, APF 5/5  RUE drift  REFLEXES: no ankle clonus  SENSORY: Normal to touch all 4 extremities  No hypertonia noted  Coordination: Normal finger to nose and heel to shin, no tremor, no dysmetria    Results for orders placed or performed during the hospital encounter of 09/08/23 (from the past 48 hours)  Glucose, capillary     Status: Abnormal   Collection Time: 09/09/23  4:41 PM  Result Value Ref Range   Glucose-Capillary 130 (H) 70 - 99 mg/dL    Comment: Glucose reference range applies only to samples taken after fasting for at least 8 hours.  Glucose, capillary     Status: Abnormal   Collection Time: 09/09/23 10:03 PM   Result Value Ref Range   Glucose-Capillary 164 (H) 70 - 99 mg/dL    Comment: Glucose reference range applies only to samples taken after fasting for at least 8 hours.  CBC     Status: None   Collection Time: 09/10/23  6:09 AM  Result Value Ref Range   WBC 8.2 4.0 - 10.5 K/uL   RBC 4.90 4.22 - 5.81 MIL/uL   Hemoglobin 15.8 13.0 - 17.0 g/dL   HCT 53.4 60.9 - 47.9 %   MCV 94.9 80.0 - 100.0 fL   MCH 32.2 26.0 - 34.0 pg   MCHC 34.0 30.0 - 36.0 g/dL   RDW 87.4 88.4 - 84.4 %   Platelets 231 150 - 400 K/uL   nRBC 0.0 0.0 - 0.2 %    Comment: Performed at [facility], [address], [place], [place] [address]  Basic metabolic panel with GFR     Status: Abnormal   Collection Time: 09/10/23  6:09 AM  Result Value Ref Range   Sodium 137 135 - 145 mmol/L   Potassium 4.0 3.5 - 5.1 mmol/L   Chloride 106 98 - 111 mmol/L   CO2 22 22 - 32 mmol/L   Glucose, Bld 136 (H) 70 - 99 mg/dL    Comment: Glucose reference range applies only to samples taken after fasting for at least 8 hours.   BUN 22 (H) 6 - 20 mg/dL   Creatinine, Ser 9.18 0.61 - 1.24 mg/dL   Calcium  8.7 (L) 8.9 - 10.3 mg/dL   GFR, Estimated >39 >39 mL/min    Comment: (NOTE) Calculated using the CKD-EPI Creatinine Equation (2021)    Anion gap 9 5 - 15    Comment: Performed at [facility], [address], [place], [place] [address]  Glucose, capillary     Status: Abnormal   Collection Time: 09/10/23  7:43 AM  Result Value Ref Range   Glucose-Capillary 144 (H) 70 - 99 mg/dL    Comment: Glucose reference range applies only to samples taken after fasting for at least 8 hours.  Glucose, capillary     Status: Abnormal   Collection Time: 09/10/23 11:38 AM  Result Value Ref Range   Glucose-Capillary 100 (H) 70 - 99 mg/dL    Comment: Glucose reference range applies only to samples taken after fasting for at least 8 hours.  Glucose, capillary     Status: Abnormal   Collection Time: 09/10/23  4:27 PM  Result Value Ref Range  Glucose-Capillary 125 (H) 70 - 99 mg/dL    Comment: Glucose reference range applies only to samples taken after fasting for at least 8 hours.   Comment 1 Notify RN    Comment 2 Document in Chart   Glucose, capillary     Status: Abnormal   Collection Time: 09/10/23  9:29 PM  Result Value Ref Range   Glucose-Capillary 144 (H) 70 - 99 mg/dL    Comment: Glucose reference range applies only to samples taken after fasting for at least 8 hours.   Comment 1 Notify RN   Glucose, capillary     Status: Abnormal   Collection Time: 09/11/23  6:34 AM  Result Value Ref Range   Glucose-Capillary 121 (H) 70 - 99 mg/dL    Comment: Glucose reference range applies only to samples taken after fasting for at least 8 hours.   Comment 1 Notify RN   Glucose, capillary     Status: Abnormal   Collection Time: 09/11/23 11:34 AM  Result Value Ref Range   Glucose-Capillary 113 (H) 70 - 99 mg/dL    Comment: Glucose reference range applies only to samples taken after fasting for at least 8 hours.   Comment 1 Notify RN    Comment 2 Document in Chart    ECHOCARDIOGRAM COMPLETE Result Date: 09/09/2023    ECHOCARDIOGRAM REPORT   Patient Name:   MERVYN PFLAUM Date of Exam: 09/09/2023 Medical Rec #:  [id number]   Height:       63.0 in Accession #:    [id number]  Weight:       200.0 lb Date of Birth:  [birth year]    BSA:          1.934 m Patient Age:    47 years    BP:           140/80 mmHg Patient Gender: M           HR:           77 bpm. Exam Location:  Inpatient Procedure: 2D Echo, Cardiac Doppler and Color Doppler (Both Spectral and Color            Flow Doppler were utilized during procedure). Indications:    Stroke I63.9  History:        Patient has prior history of Echocardiogram examinations, most                 recent 06/07/2016. CAD, Stroke; Risk Factors:Hypertension,                 Diabetes, Dyslipidemia and Sleep Apnea.  Sonographer:    Thea Norlander RCS Referring Phys: [id number] ASHISH ARORA IMPRESSIONS  1. Left  ventricular ejection fraction, by estimation, is 60 to 65%. The left ventricle has normal function. The left ventricle has no regional wall motion abnormalities. Left ventricular diastolic parameters were normal.  2. Right ventricular systolic function is normal. The right ventricular size is normal.  3. The mitral valve is normal in structure. No evidence of mitral valve regurgitation. No evidence of mitral stenosis.  4. The aortic valve is normal in structure. There is mild calcification of the aortic valve. Aortic valve regurgitation is not visualized. No aortic stenosis is present.  5. The inferior vena cava is normal in size with greater than 50% respiratory variability, suggesting right atrial pressure of 3 mmHg. FINDINGS  Left Ventricle: Left ventricular ejection fraction, by estimation, is 60 to 65%. The left ventricle has normal function. The left ventricle has  no regional wall motion abnormalities. The left ventricular internal cavity size was normal in size. There is  no left ventricular hypertrophy. Left ventricular diastolic parameters were normal. Right Ventricle: The right ventricular size is normal. No increase in right ventricular wall thickness. Right ventricular systolic function is normal. Left Atrium: Left atrial size was normal in size. Right Atrium: Right atrial size was normal in size. Pericardium: There is no evidence of pericardial effusion. Mitral Valve: The mitral valve is normal in structure. No evidence of mitral valve regurgitation. No evidence of mitral valve stenosis. Tricuspid Valve: The tricuspid valve is normal in structure. Tricuspid valve regurgitation is not demonstrated. No evidence of tricuspid stenosis. Aortic Valve: The aortic valve is normal in structure. There is mild calcification of the aortic valve. Aortic valve regurgitation is not visualized. No aortic stenosis is present. Aortic valve peak gradient measures 4.9 mmHg. Pulmonic Valve: The pulmonic valve was normal in  structure. Pulmonic valve regurgitation is not visualized. No evidence of pulmonic stenosis. Aorta: The aortic root is normal in size and structure. Venous: The inferior vena cava is normal in size with greater than 50% respiratory variability, suggesting right atrial pressure of 3 mmHg. IAS/Shunts: No atrial level shunt detected by color flow Doppler.  LEFT VENTRICLE PLAX 2D LVIDd:         4.60 cm   Diastology LVIDs:         3.10 cm   LV e' medial:    12.60 cm/s LV PW:         0.90 cm   LV E/e' medial:  5.5 LV IVS:        0.90 cm   LV e' lateral:   13.20 cm/s LVOT diam:     2.20 cm   LV E/e' lateral: 5.2 LV SV:         63 LV SV Index:   33 LVOT Area:     3.80 cm  RIGHT VENTRICLE RV S prime:     14.50 cm/s TAPSE (M-mode): 2.3 cm LEFT ATRIUM             Index        RIGHT ATRIUM           Index LA diam:        3.80 cm 1.97 cm/m   RA Area:     17.00 cm LA Vol (A2C):   33.0 ml 17.09 ml/m  RA Volume:   45.70 ml  23.63 ml/m LA Vol (A4C):   55.2 ml 28.55 ml/m LA Biplane Vol: 46.6 ml 24.10 ml/m  AORTIC VALVE AV Area (Vmax): 2.92 cm AV Vmax:        111.00 cm/s AV Peak Grad:   4.9 mmHg LVOT Vmax:      85.40 cm/s LVOT Vmean:     55.000 cm/s LVOT VTI:       0.166 m  AORTA Ao Root diam: 3.20 cm Ao Asc diam:  2.90 cm MITRAL VALVE MV Area (PHT): 3.85 cm    SHUNTS MV Decel Time: 197 msec    Systemic VTI:  0.17 m MV E velocity: 68.70 cm/s  Systemic Diam: 2.20 cm MV A velocity: 69.70 cm/s MV E/A ratio:  0.99 Aditya Sabharwal Electronically signed by Ria Commander Signature Date/Time: 09/09/2023/5:42:03 PM    Final    MR BRAIN WO CONTRAST Result Date: 09/09/2023 CLINICAL DATA:  Stroke follow-up. EXAM: MRI HEAD WITHOUT CONTRAST TECHNIQUE: Multiplanar, multiecho pulse sequences of the brain and surrounding structures were obtained without  intravenous contrast. COMPARISON:  CT head and CTA head and neck 09/08/2023. FINDINGS: Brain: 9 mm focus of restricted diffusion in the left para median pons at the level of the middle  cerebellar peduncles. No evidence of intracranial hemorrhage. T2/FLAIR hyperintensity in the periventricular and subcortical white matter. Remote lacunar infarcts in the bilateral basal ganglia. Additional areas likely reflecting prominent perivascular spaces in the inferior aspect of both basal ganglia. No edema, mass effect, or midline shift. The basilar cisterns are patent. No extra-axial fluid collections. Ventricles: Normal size and configuration of the ventricles. Vascular: Skull base flow voids are visualized. Skull and upper cervical spine: No focal abnormality. Sinuses/Orbits: Orbits are symmetric. Mucous retention cyst in the left maxillary sinus. Other: Mastoid air cells are clear. IMPRESSION: 9 mm focus of acute infarct in the left paramedian pons. Mild chronic microvascular ischemic changes. Remote lacunar infarcts in the bilateral basal ganglia. Electronically Signed   By: Donnice Mania M.D.   On: 09/09/2023 14:08     Blood pressure 113/75, pulse 73, temperature 98.3 F (36.8 C), temperature source Oral, resp. rate 19, height 5' 3 (1.6 m), weight 84.9 kg, SpO2 94%.  Medical Problem List and Plan: 1. Functional deficits secondary to left paramedian pontine CVA s/p TNK  -patient may  shower  -ELOS/Goals: PT, OT modified to independent, 4 to 6 days  -Admit to CIR 2.  Antithrombotics: -DVT/anticoagulation:  Pharmaceutical: Lovenox added  -antiplatelet therapy: ASA/Brilinta X 4 weeks followed by Brilinta alone.  3. Pain Management: Tylenol prn.  4. Mood/Behavior/Sleep: LCSW to follow for evaluation and support  -antipsychotic agents: N/A 5. Neuropsych/cognition: This patient is capable of making decisions on his own behalf. 6. Skin/Wound Care: Routine pressure relief measures.  7. Fluids/Electrolytes/Nutrition: Monitor I/O. Check CMET in am 8.  HTN: Monitor BP TID--was on Bisoprolol and Lisinopril .  BP goal less than 180/105 --Continues on low dose Bisoprolol. Has had HR improving.   9. T2DM: Hgb A1c-8.1. Was on tresiba  60 units, trulicity  and jardiance  at home--compliance?  --BS relatively ranging from 100-160. Will resume insulin  glargine at 10 units HS  --encourage fluid intake as on Jardiance  and BUN trending up.  10. Pre-renal azotemia: BUN- 22. Encourage fluid intake.  Recheck labs 11. OSA: Has been compliant with CPAP use.  12. Dyslipidemia: Trig-155/LDL-98.  Statin 13. Obesity class I: Educated on diet and exercise to promote health and weight loss  Body mass index is 33.16 kg/m. 14. CAD DES to LAD 07/11/20.  Continue ASA/Brilinta as above    Sharlet GORMAN Schmitz, PA-C 09/11/2023  I have personally performed a face to face diagnostic evaluation of this patient and formulated the key components of the plan.  Additionally, I have personally reviewed laboratory data, imaging studies, as well as relevant notes and concur with the physician assistant's documentation above.  The patient's status has not changed from the original H&P.  Any changes in documentation from the acute care chart have been noted above.  Murray Collier, MD, [facility]

## 2023-09-11 NOTE — Progress Notes (Signed)
 Inpatient Rehab Admissions Coordinator:    I have insurance approval and a bed available for pt to admit to CIR today. Dr. Rosemarie in agreement.  Will let pt/family and TOC team know.   Reche Lowers, PT, DPT Admissions Coordinator [phone] 09/11/23  11:08 AM

## 2023-09-11 NOTE — Plan of Care (Signed)
  Problem: Consults Goal: RH STROKE PATIENT EDUCATION Description: See Patient Education module for education specifics  Outcome: Progressing Goal: Nutrition Consult-if indicated Outcome: Progressing Goal: Diabetes Guidelines if Diabetic/Glucose > 140 Description: If diabetic or lab glucose is > 140 mg/dl - Initiate Diabetes/Hyperglycemia Guidelines & Document Interventions  Outcome: Progressing   Problem: RH BOWEL ELIMINATION Goal: RH STG MANAGE BOWEL WITH ASSISTANCE Description: STG Manage Bowel with  Mod I Assistance. Outcome: Progressing   Problem: RH BLADDER ELIMINATION Goal: RH STG MANAGE BLADDER WITH ASSISTANCE Description: STG Manage Bladder With mod I Assistance Outcome: Progressing   Problem: RH SKIN INTEGRITY Goal: RH STG SKIN FREE OF INFECTION/BREAKDOWN Description: Manage skin free of infection/breakdown with mod I assistance Outcome: Progressing   Problem: RH SAFETY Goal: RH STG ADHERE TO SAFETY PRECAUTIONS W/ASSISTANCE/DEVICE Description: STG Adhere to Safety Precautions With mod I Assistance/Device. Outcome: Progressing   Problem: RH PAIN MANAGEMENT Goal: RH STG PAIN MANAGED AT OR BELOW PT'S PAIN GOAL Description: <4 w/ prns Outcome: Progressing   Problem: RH KNOWLEDGE DEFICIT Goal: RH STG INCREASE KNOWLEDGE OF DIABETES Description: Manage increase knowledge of diabetes with mod I assistance using educational materials provided Outcome: Progressing Goal: RH STG INCREASE KNOWLEDGE OF HYPERTENSION Description: Manage increase knowledge of hypertension with mod I assistance from family  using educational materials provided Outcome: Progressing Goal: RH STG INCREASE KNOWLEGDE OF HYPERLIPIDEMIA Description: Manage increase knowledge of hyperlipidemia with mod I assistance from family using educational materials provided Outcome: Progressing Goal: RH STG INCREASE KNOWLEDGE OF STROKE PROPHYLAXIS Description: Manage increase knowledge of stroke prophylaxis with  mod I assistance from family using educational materials provided Outcome: Progressing   Problem: Education: Goal: Ability to describe self-care measures that may prevent or decrease complications (Diabetes Survival Skills Education) will improve Outcome: Progressing Goal: Individualized Educational Video(s) Outcome: Progressing   Problem: Coping: Goal: Ability to adjust to condition or change in health will improve Outcome: Progressing   Problem: Fluid Volume: Goal: Ability to maintain a balanced intake and output will improve Outcome: Progressing   Problem: Health Behavior/Discharge Planning: Goal: Ability to identify and utilize available resources and services will improve Outcome: Progressing Goal: Ability to manage health-related needs will improve Outcome: Progressing   Problem: Metabolic: Goal: Ability to maintain appropriate glucose levels will improve Outcome: Progressing   Problem: Nutritional: Goal: Maintenance of adequate nutrition will improve Outcome: Progressing Goal: Progress toward achieving an optimal weight will improve Outcome: Progressing   Problem: Skin Integrity: Goal: Risk for impaired skin integrity will decrease Outcome: Progressing   Problem: Tissue Perfusion: Goal: Adequacy of tissue perfusion will improve Outcome: Progressing

## 2023-09-11 NOTE — Plan of Care (Signed)

## 2023-09-11 NOTE — TOC Transition Note (Signed)
 Transition of Care [facility]) - Discharge Note   Patient Details  Name: Tyler Hoffman MRN: [id number] Date of Birth: 1975/04/27  [facility]) CM/SW Contact:  Andrez JULIANNA George, RN Phone Number: 09/11/2023, 10:43 AM   Clinical Narrative:     Pt is discharging to CIR today. TOC signing off.  Final next level of care: IP Rehab Facility Barriers to Discharge: No Barriers Identified   Patient Goals and CMS Choice   CMS Medicare.gov Compare Post Acute Care list provided to:: Patient Choice offered to / list presented to : Patient      Discharge Placement                       Discharge Plan and Services Additional resources added to the After Visit Summary for                                       Social Drivers of Health (SDOH) Interventions SDOH Screenings   Food Insecurity: No Food Insecurity (09/10/2023)  Housing: Low Risk  (09/10/2023)  Transportation Needs: No Transportation Needs (09/10/2023)  Utilities: Not At Risk (09/10/2023)  Depression (PHQ2-9): Low Risk  (09/05/2021)  Social Connections: Socially Integrated (09/10/2023)  Tobacco Use: Low Risk  (09/08/2023)     Readmission Risk Interventions     No data to display

## 2023-09-11 NOTE — Progress Notes (Signed)
 Occupational Therapy Treatment Patient Details Name: Tyler Hoffman MRN: [id number] DOB: September 04, 1975 Today's Date: 09/11/2023   History of present illness 48 y.o. male presented 09/08/23 for evaluation of sudden onset of right leg and arm weakness. NIHSS=2; received TNK; bradycardia to 30s; Cardiology consult likely vagal episode;  CT head negative  CTA mod stenosis L MCA post m3, mod stenosis of L vertrbal distal M4 segment    PMH CAD, lacunar infarcts on plavix, CAD, HTN, DM2,   OT comments  Pt progressing toward goals, provided with theraputty and [facility] handout this session, along with pen and paper to improve RUE dexterity, strength, ROM, and coordination. Pt able to demo multiple exercises for [facility] and theraputty. Pt completing standing shaving task at sink, reliant on unilateral UE support for stability during task and incr time, concentration to complete. Pt presenting with impairments listed below, will follow acutely. Patient will benefit from intensive inpatient follow-up therapy, >3 hours/day to maximize safety/ind with ADL/functional mobility.       If plan is discharge home, recommend the following:  A little help with walking and/or transfers;A little help with bathing/dressing/bathroom;Assistance with cooking/housework;Assist for transportation   Equipment Recommendations  None recommended by OT    Recommendations for Other Services PT consult;Rehab consult    Precautions / Restrictions Precautions Precautions: Fall Restrictions Weight Bearing Restrictions Per Provider Order: No       Mobility Bed Mobility Overal bed mobility: Needs Assistance Bed Mobility: Supine to Sit     Supine to sit: Supervision          Transfers Overall transfer level: Needs assistance Equipment used: None Transfers: Sit to/from Stand Sit to Stand: Supervision           General transfer comment: unilateral UE support with mobility     Balance Overall balance assessment: Needs  assistance Sitting-balance support: Feet supported Sitting balance-Leahy Scale: Normal     Standing balance support: Single extremity supported Standing balance-Leahy Scale: Fair                             ADL either performed or assessed with clinical judgement   ADL Overall ADL's : Needs assistance/impaired Eating/Feeding: Set up;Sitting   Grooming: Set up;Standing Grooming Details (indicate cue type and reason): shaves face standing in front of mirror                 Toilet Transfer: Supervision/safety;Contact guard assist Toilet Transfer Details (indicate cue type and reason): holds on to bedrail/countertop with unilateral UE support with short distance mobility         Functional mobility during ADLs: Supervision/safety      Extremity/Trunk Assessment Upper Extremity Assessment Upper Extremity Assessment: RUE deficits/detail RUE Deficits / Details: 4/5 globally, impaired [facility] RUE Coordination: decreased fine motor   Lower Extremity Assessment Lower Extremity Assessment: Defer to PT evaluation        Vision   Vision Assessment?: No apparent visual deficits   Perception Perception Perception: Not tested   Praxis Praxis Praxis: Not tested   Communication Communication Communication: No apparent difficulties   Cognition Arousal: Alert Behavior During Therapy: WFL for tasks assessed/performed Cognition: No apparent impairments                               Following commands: Intact        Cueing   Cueing Techniques: Verbal cues  Exercises  Other Exercises Other Exercises: theraputty handout Other Exercises: [facility] handout Other Exercises: green squeeze foam Other Exercises: provided with paper and pen    Shoulder Instructions       General Comments VSS on RA    Pertinent Vitals/ Pain       Pain Assessment Pain Assessment: No/denies pain  Home Living                                           Prior Functioning/Environment              Frequency  Min 2X/week        Progress Toward Goals  OT Goals(current goals can now be found in the care plan section)  Progress towards OT goals: Progressing toward goals  Acute Rehab OT Goals Patient Stated Goal: to get to rehab OT Goal Formulation: With patient/family Time For Goal Achievement: 09/23/23 Potential to Achieve Goals: Good ADL Goals Pt/caregiver will Perform Home Exercise Program: Increased ROM;Increased strength;Right Upper extremity;With Supervision Additional ADL Goal #1: pt will recieve passing score on pillbox assessment in order to promote ind with IADL Additional ADL Goal #2: Pt will tolerate OOB standing functional activity x15 min in order to improve standing balance for ADLs  Plan      Co-evaluation                 AM-PAC OT 6 Clicks Daily Activity     Outcome Measure   Help from another person eating meals?: A Little Help from another person taking care of personal grooming?: A Little Help from another person toileting, which includes using toliet, bedpan, or urinal?: A Little Help from another person bathing (including washing, rinsing, drying)?: A Little Help from another person to put on and taking off regular upper body clothing?: A Little Help from another person to put on and taking off regular lower body clothing?: A Little 6 Click Score: 18    End of Session    OT Visit Diagnosis: Unsteadiness on feet (R26.81);Other abnormalities of gait and mobility (R26.89);Muscle weakness (generalized) (M62.81)   Activity Tolerance Patient tolerated treatment well   Patient Left in chair;with call bell/phone within reach   Nurse Communication Mobility status        Time: 0800-0827 OT Time Calculation (min): 27 min  Charges: OT General Charges $OT Visit: 1 Visit OT Treatments $Self Care/Home Management : 8-22 mins $Therapeutic Exercise: 8-22 mins  Shelby Peltz K, OTD,  OTR/L SecureChat Preferred Acute Rehab [phone]   Laneta POUR Koonce 09/11/2023, 8:44 AM

## 2023-09-11 NOTE — Progress Notes (Signed)
   09/11/23 0930  Spiritual Encounters  Type of Visit Initial  Care provided to: Patient  Reason for visit Advance directives  OnCall Visit No  Goals  Self/Personal Goals Healthy lifestyle changes; make sure family is taken care of  Clinical Care Goals Motivated to recover/participate in rehabilitaion  Interventions  Spiritual Care Interventions Made Established relationship of care and support;Compassionate presence;Reflective listening;Normalization of emotions;Explored values/beliefs/practices/strengths;Self-care teaching;Encouragement;Decision-making support/facilitation  Intervention Outcomes  Outcomes Connection to spiritual care;Awareness around self/spiritual resourses;Connection to values and goals of care;Autonomy/agency;Reduced isolation    Chaplain responded to spiritual consult request for AD. Chaplain provided paperwork and information, along with spiritual care as indicated above. Pt expressed gratitude for chaplain presence, and chaplains remain available.

## 2023-09-11 NOTE — Progress Notes (Signed)
 Urbano Albright, MD  Physician Physical Medicine and Rehabilitation   PMR Pre-admission    Addendum   Date of Service: 09/11/2023 11:16 AM  Related encounter: ED to Hosp-Admission (Discharged) from 09/08/2023 in [facility]   Expand All Collapse All  PMR Admission Coordinator Pre-Admission Assessment   Patient: Tyler Hoffman is an 48 y.o., male MRN: [id number] DOB: August 11, 1975 Height: 5' 3 (160 cm) Weight: 84.9 kg   Insurance Information HMO:     PPO:      PCP:      IPA:      80/20:      OTHER:  PRIMARY: [employer] NAP      Policy#: [id number]      Subscriber: pt CM Name: [facility]      Phone#: [phone]     Fax#: [phone] Pre-Cert#: [id number] auth for CIR from Phaedra with [employer] with updates due to fax listed above on 7/8      Employer:  Benefits:  Phone #: [phone]      Name:  Eff. Date: 03/13/23     Deduct: $300 (met $81.51)      Out of Pocket Max: $5000 ($151.36)      Life Max:  CIR: 90%      SNF: 90% Outpatient: 90%     Co-Pay: 10% Home Health: 90%      Co-Pay: 10% DME: 90%     Co-Pay: 10% Providers:  SECONDARY:       Policy#:      Phone#:    Artist:       Phone#:    The Engineer, materials Information Summary" for patients in Inpatient Rehabilitation Facilities with attached "Privacy Act Statement-Health Care Records" was provided and verbally reviewed with: Patient and Family   Emergency Contact Information Contact Information       Name Relation Home Work Mobile    [place] Spouse [phone]   [phone]         Other Contacts   None on File        Current Medical History  Patient Admitting Diagnosis: CVA    History of Present Illness: Pt is a 48 y/o male with PMH of CAD, lacunar infarcts currently on Plavix, HTN, and DM admitted to [facility] on 6/29 with sudden onset R hemiparesis.  NIHSS on arrival 2, hgb A1C 8.1, CTA head/neck showed multiple areas of intracranial atherosclerosis in bilateral hemispheres in  both anterior and posterior circulation distributions.  MRI showed left paramedian pontine infarct.  EF normal with no atrial level shunt.  Recommendations are for aspirin and brilinta x4 weeks, then brilinta alone.  In ED he developed bradycardia and cardiology was consulted.  Cardiology recommended 2 week monitor at discharge, but felt likely bradycardia due to vagal response.  Therapy evaluations completed and pt was recommended for CIR.     Complete NIHSS TOTAL: 0   Patient's medical record from [facility] has been reviewed by the rehabilitation admission coordinator and physician.   Past Medical History      Past Medical History:  Diagnosis Date   CAD S/P DES PCI-LAD (08/2020 - in [place]) 07/19/2020    CTA 07/11/2020 (performed in [place]): In response to abnormal functional study. Coronary Calcium  Score 0.3.  LAD and circumflex have separate ostia.  Appears to be occlusion of the LAD after SP1.  LCx gives off 1 major marginal branch with no significant disease.  RCA has diffuse atheroma with mod stenosis prior to Crux.  LVEF  Nl Systolic Fxn. ->  Cath = DES PCI prox LAD SubTO, FFR Neg RCA.   Essential hypertension 05/22/2016   History of rosacea     Hyperlipidemia associated with type 2 diabetes mellitus (HCC) 08/22/2006    Qualifier: Diagnosis of  By: Baird FNP, Frieda Kipper    Lacunar infarction (HCC) 05/2016    No residual infarct   Obesity (BMI 30-39.9) 10/16/2013   OSA on CPAP      Uses CPAP   Type II diabetes mellitus with complication (HCC)      [place] CVA &  CAD- PCI LAD          Has the patient had major surgery during 100 days prior to admission? No   Family History   family history includes Coronary artery disease in his maternal grandmother and mother; Coronary artery disease (age of onset: 72 - 62) in his paternal grandfather; Coronary artery disease (age of onset: 57 53 85) in his paternal uncle; Diabetes in his maternal grandfather; Heart attack in his maternal  grandmother and paternal grandfather; Heart attack (age of onset: 58) in his mother; Heart disease in his paternal grandfather and paternal grandmother.   Current Medications  Current Medications    Current Facility-Administered Medications:    acetaminophen (TYLENOL) tablet 650 mg, 650 mg, Oral, Q4H PRN **OR** acetaminophen (TYLENOL) 160 MG/5ML solution 650 mg, 650 mg, Per Tube, Q4H PRN **OR** acetaminophen (TYLENOL) suppository 650 mg, 650 mg, Rectal, Q4H PRN, Arora, Ashish, MD   aspirin EC tablet 81 mg, 81 mg, Oral, Daily, Sethi, Pramod S, MD, 81 mg at 09/11/23 0815   bisoprolol (ZEBETA) tablet 2.5 mg, 2.5 mg, Oral, Daily, Nishan, Peter C, MD, 2.5 mg at 09/11/23 9184   empagliflozin  (JARDIANCE ) tablet 25 mg, 25 mg, Oral, Daily, Sethi, Pramod S, MD, 25 mg at 09/11/23 0815   insulin  aspart (novoLOG) injection 0-15 Units, 0-15 Units, Subcutaneous, TID WC, Arora, Ashish, MD, 2 Units at 09/11/23 9348   rosuvastatin  (CRESTOR ) tablet 40 mg, 40 mg, Oral, Daily, Rosemarie, Pramod S, MD, 40 mg at 09/11/23 0815   senna-docusate (Senokot-S) tablet 1 tablet, 1 tablet, Oral, QHS PRN, Arora, Ashish, MD, 1 tablet at 09/10/23 2144   ticagrelor (BRILINTA) tablet 90 mg, 90 mg, Oral, BID, Sethi, Pramod S, MD, 90 mg at 09/11/23 0815     Patients Current Diet:  Diet Order                  Diet heart healthy/carb modified Fluid consistency: Thin  Diet effective now                         Precautions / Restrictions Precautions Precautions: Fall Restrictions Weight Bearing Restrictions Per Provider Order: No    Has the patient had 2 or more falls or a fall with injury in the past year? No   Prior Activity Level Community (5-7x/wk): indep prior to admit, no AD, is an elementary school principal in [place]   Prior Functional Level Self Care: Did the patient need help bathing, dressing, using the toilet or eating? Independent   Indoor Mobility: Did the patient need assistance with walking from room to  room (with or without device)? Independent   Stairs: Did the patient need assistance with internal or external stairs (with or without device)? Independent   Functional Cognition: Did the patient need help planning regular tasks such as shopping or remembering to take medications? Independent   Patient Information Are you of  Hispanic, Latino/a,or Spanish origin?: A. No, not of Hispanic, Latino/a, or Spanish origin What is your race?: A. White Do you need or want an interpreter to communicate with a doctor or health care staff?: 0. No   Patient's Response To:  Health Literacy and Transportation Is the patient able to respond to health literacy and transportation needs?: Yes Health Literacy - How often do you need to have someone help you when you read instructions, pamphlets, or other written material from your doctor or pharmacy?: Never In the past 12 months, has lack of transportation kept you from medical appointments or from getting medications?: No In the past 12 months, has lack of transportation kept you from meetings, work, or from getting things needed for daily living?: No   Home Assistive Devices / Equipment Home Equipment: None   Prior Device Use: Indicate devices/aids used by the patient prior to current illness, exacerbation or injury? None of the above   Current Functional Level Cognition   Arousal/Alertness: Awake/alert Overall Cognitive Status: Within Functional Limits for tasks assessed Orientation Level: Oriented X4    Extremity Assessment (includes Sensation/Coordination)   Upper Extremity Assessment: RUE deficits/detail RUE Deficits / Details: 4/5 globally, impaired [facility] RUE Coordination: decreased fine motor  Lower Extremity Assessment: Defer to PT evaluation RLE Deficits / Details: pt describes feeling of heaviness; 4+/5 throughout RLE Sensation: WNL RLE Coordination: decreased fine motor, decreased gross motor (slight dyscoordination toe tapping and with  placing activities with RLE)     ADLs   Overall ADL's : Needs assistance/impaired Eating/Feeding: Set up, Sitting Grooming: Set up, Standing Grooming Details (indicate cue type and reason): shaves face standing in front of mirror Upper Body Bathing: Contact guard assist, Sitting Lower Body Bathing: Contact guard assist, Sitting/lateral leans Upper Body Dressing : Contact guard assist, Sitting Lower Body Dressing: Contact guard assist, Sitting/lateral leans Toilet Transfer: Supervision/safety, Contact guard assist Toilet Transfer Details (indicate cue type and reason): holds on to bedrail/countertop with unilateral UE support with short distance mobility Toileting- Clothing Manipulation and Hygiene: Contact guard assist Functional mobility during ADLs: Supervision/safety     Mobility   Overal bed mobility: Needs Assistance Bed Mobility: Supine to Sit Supine to sit: Supervision Sit to supine: Supervision General bed mobility comments: in recliner upon arrival     Transfers   Overall transfer level: Needs assistance Equipment used: None Transfers: Sit to/from Stand Sit to Stand: Supervision General transfer comment: supervision for safety     Ambulation / Gait / Stairs / Wheelchair Mobility   Ambulation/Gait Ambulation/Gait assistance: Min assist, Contact guard assist, Mod assist Gait Distance (Feet): 100 Feet (x3) Assistive device: None Gait Pattern/deviations: Step-through pattern, Decreased stride length, Decreased dorsiflexion - right, Decreased weight shift to right General Gait Details: slightly unsteady gait with decreased weight shift to R LE Gait velocity: decr Gait velocity interpretation: 1.31 - 2.62 ft/sec, indicative of limited community ambulator     Posture / Balance Balance Overall balance assessment: Needs assistance Sitting-balance support: Feet supported Sitting balance-Leahy Scale: Normal Standing balance support: Single extremity supported Standing  balance-Leahy Scale: Fair High level balance activites: Side stepping, Backward walking, Turns, Sudden stops High Level Balance Comments: x13ft fast/slow/stop, x2 reps; x10 ft backwards walking x2 reps Standardized Balance Assessment Standardized Balance Assessment : Berg Balance Test Berg Balance Test Sit to Stand: Needs minimal aid to stand or to stabilize Standing Unsupported: Able to stand 30 seconds unsupported Sitting with Back Unsupported but Feet Supported on Floor or Stool: Able to sit safely  and securely 2 minutes Stand to Sit: Sits safely with minimal use of hands Transfers: Needs one person to assist Standing Unsupported with Eyes Closed: Able to stand 10 seconds with supervision Standing Ubsupported with Feet Together: Able to place feet together independently and stand for 1 minute with supervision From Standing, Reach Forward with Outstretched Arm: Can reach forward >12 cm safely (5) From Standing Position, Pick up Object from Floor: Able to pick up shoe, needs supervision From Standing Position, Turn to Look Behind Over each Shoulder: Looks behind one side only/other side shows less weight shift Turn 360 Degrees: Needs close supervision or verbal cueing Standing Unsupported, Alternately Place Feet on Step/Stool: Able to complete >2 steps/needs minimal assist Standing Unsupported, One Foot in Front: Needs help to step but can hold 15 seconds Standing on One Leg: Unable to try or needs assist to prevent fall Total Score: 30     Special needs/care consideration Diabetic management yes    Previous Home Environment (from acute therapy documentation) Living Arrangements: Spouse/significant other, Children  Lives With: Spouse, Family Available Help at Discharge: Family Type of Home: House Home Layout: One level Home Access: Ramped entrance Bathroom Shower/Tub: Walk-in shower Home Care Services: No Additional Comments: In [place] has 3 floors, no steps to enter, walk in  shower   Discharge Living Setting Plans for Discharge Living Setting: Lives with (comment) (staying with parents in the [place] , spouse/4 kids here as well) Type of Home at Discharge: House Discharge Home Layout: One level Discharge Home Access: Ramped entrance Discharge Bathroom Shower/Tub: Walk-in shower Discharge Bathroom Toilet: Standard Discharge Bathroom Accessibility: Yes How Accessible: Accessible via walker Does the patient have any problems obtaining your medications?: No   Social/Family/Support Systems Patient Roles: Spouse, Parent Contact Information: has 4 school aged children Anticipated Caregiver: mod I to indep goals, spouse as well Anticipated Industrial/product designer Information: Gerrianne  [phone] Ability/Limitations of Caregiver: none stated Caregiver Availability: 24/7 Discharge Plan Discussed with Primary Caregiver: Yes Is Caregiver In Agreement with Plan?: Yes Does Caregiver/Family have Issues with Lodging/Transportation while Pt is in Rehab?: No   Goals Patient/Family Goal for Rehab: PT/OT modified indep to indep, SLP n/a Expected length of stay: 4-6 days Additional Information: Discharge plan: return to parents home (which is local) with parents, spouse, 4 kids Pt/Family Agrees to Admission and willing to participate: Yes Program Orientation Provided & Reviewed with Pt/Caregiver Including Roles  & Responsibilities: Yes   Decrease burden of Care through IP rehab admission: n/a   Possible need for SNF placement upon discharge: Not anticipated.  Plan discharge to parents home until return to [place] in August if able.  Has parents, spouse, and 4 children at home.    Patient Condition: I have reviewed medical records from [facility], spoken with [facility] team, and patient. I met with patient at the bedside for inpatient rehabilitation assessment.  Patient will benefit from ongoing PT and OT, can actively participate in 3 hours of therapy a day 5 days of the week, and can make  measurable gains during the admission.  Patient will also benefit from the coordinated team approach during an Inpatient Acute Rehabilitation admission.  The patient will receive intensive therapy as well as Rehabilitation physician, nursing, social worker, and care management interventions.  Due to safety, disease management, medication administration, pain management, and patient education the patient requires 24 hour a day rehabilitation nursing.  The patient is currently min assist or better with mobility and basic ADLs.  Discharge setting and therapy post  discharge at home with outpatient is anticipated.  Patient has agreed to participate in the Acute Inpatient Rehabilitation Program and will admit today.   Preadmission Screen Completed By:  Reche FORBES Lowers, 09/11/2023 11:16 AM ______________________________________________________________________   Discussed status with Dr. Urbano on 09/11/23  at 11:24 AM  and received approval for admission today.   Admission Coordinator:  Lashaye Fisk E Siennah Barrasso, PT, time 09/11/23 Pattricia 11:24 AM     Assessment/Plan: Diagnosis: CVA Does the need for close, 24 hr/day Medical supervision in concert with the patient's rehab needs make it unreasonable for this patient to be served in a less intensive setting? Yes Co-Morbidities requiring supervision/potential complications: HTN, HLD, DM2, obesity, CAD< OSA Due to bladder management, bowel management, safety, skin/wound care, disease management, medication administration, pain management, and patient education, does the patient require 24 hr/day rehab nursing? Yes Does the patient require coordinated care of a physician, rehab nurse, PT, OT, and SLP to address physical and functional deficits in the context of the above medical diagnosis(es)? Yes Addressing deficits in the following areas: balance, endurance, locomotion, strength, transferring, bowel/bladder control, bathing, dressing, feeding, grooming, toileting, and  psychosocial support Can the patient actively participate in an intensive therapy program of at least 3 hrs of therapy 5 days a week? Yes The potential for patient to make measurable gains while on inpatient rehab is excellent Anticipated functional outcomes upon discharge from inpatient rehab: independent and modified independent PT, independent and modified independent OT, n/a SLP Estimated rehab length of stay to reach the above functional goals is: 4-6 days Anticipated discharge destination: Home 10. Overall Rehab/Functional Prognosis: excellent     MD Signature: Murray Urbano      Revision History

## 2023-09-11 NOTE — PMR Pre-admission (Addendum)
 PMR Admission Coordinator Pre-Admission Assessment  Patient: Tyler Hoffman is an 48 y.o., male MRN: [id number] DOB: [birth year] Height: 5' 3 (160 cm) Weight: 84.9 kg  Insurance Information HMO:     PPO:      PCP:      IPA:      80/20:      OTHER:  PRIMARY: [employer] NAP      Policy#: [id number]      Subscriber: pt CM Name: Pha[facility]      Phone#: [phone]     Fax#: [phone] Pre-Cert#: [id number] auth for CIR from Phaedra with [employer] with updates due to fax listed above on 7/8      Employer:  Benefits:  Phone #: [phone]      Name:  Eff. Date: 03/13/23     Deduct: $300 (met $81.51)      Out of Pocket Max: $5000 ($151.36)      Life Max:  CIR: 90%      SNF: 90% Outpatient: 90%     Co-Pay: 10% Home Health: 90%      Co-Pay: 10% DME: 90%     Co-Pay: 10% Providers:  SECONDARY:       Policy#:      Phone#:   Artist:       Phone#:   The Engineer, materials Information Summary" for patients in Inpatient Rehabilitation Facilities with attached "Privacy Act Statement-Health Care Records" was provided and verbally reviewed with: Patient and Family  Emergency Contact Information Contact Information     Name Relation Home Work Mobile   [place] Spouse [phone]  [phone]      Other Contacts   None on File     Current Medical History  Patient Admitting Diagnosis: CVA   History of Present Illness: Pt is a 48 y/o male with PMH of CAD, lacunar infarcts currently on Plavix, HTN, and DM admitted to [facility] on 6/29 with sudden onset R hemiparesis.  NIHSS on arrival 2, hgb A1C 8.1, CTA head/neck showed multiple areas of intracranial atherosclerosis in bilateral hemispheres in both anterior and posterior circulation distributions.  MRI showed left paramedian pontine infarct.  EF normal with no atrial level shunt.  Recommendations are for aspirin and brilinta x4 weeks, then brilinta alone.  In ED he developed bradycardia and cardiology was consulted.  Cardiology recommended 2  week monitor at discharge, but felt likely bradycardia due to vagal response.  Therapy evaluations completed and pt was recommended for CIR.    Complete NIHSS TOTAL: 0  Patient's medical record from [facility] has been reviewed by the rehabilitation admission coordinator and physician.  Past Medical History  Past Medical History:  Diagnosis Date   CAD S/P DES PCI-LAD (08/2020 - in [place]) 07/19/2020   CTA 07/11/2020 (performed in [place]): In response to abnormal functional study. Coronary Calcium  Score 0.3.  LAD and circumflex have separate ostia.  Appears to be occlusion of the LAD after SP1.  LCx gives off 1 major marginal branch with no significant disease.  RCA has diffuse atheroma with mod stenosis prior to Crux.  LVEF Nl Systolic Fxn. ->  Cath = DES PCI prox LAD SubTO, FFR Neg RCA.   Essential hypertension 05/22/2016   History of rosacea    Hyperlipidemia associated with type 2 diabetes mellitus (HCC) 08/22/2006   Qualifier: Diagnosis of  By: Baird FNP, Frieda Kipper    Lacunar infarction (HCC) 05/2016   No residual infarct   Obesity (BMI 30-39.9) 10/16/2013   OSA on CPAP  Uses CPAP   Type II diabetes mellitus with complication (HCC)    [place] CVA &  CAD- PCI LAD    Has the patient had major surgery during 100 days prior to admission? No  Family History   family history includes Coronary artery disease in his maternal grandmother and mother; Coronary artery disease (age of onset: 109 - 49) in his paternal grandfather; Coronary artery disease (age of onset: 24 66 99) in his paternal uncle; Diabetes in his maternal grandfather; Heart attack in his maternal grandmother and paternal grandfather; Heart attack (age of onset: 56) in his mother; Heart disease in his paternal grandfather and paternal grandmother.  Current Medications  Current Facility-Administered Medications:    acetaminophen (TYLENOL) tablet 650 mg, 650 mg, Oral, Q4H PRN **OR** acetaminophen (TYLENOL) 160 MG/5ML  solution 650 mg, 650 mg, Per Tube, Q4H PRN **OR** acetaminophen (TYLENOL) suppository 650 mg, 650 mg, Rectal, Q4H PRN, Arora, Ashish, MD   aspirin EC tablet 81 mg, 81 mg, Oral, Daily, Sethi, Pramod S, MD, 81 mg at 09/11/23 0815   bisoprolol (ZEBETA) tablet 2.5 mg, 2.5 mg, Oral, Daily, Nishan, Peter C, MD, 2.5 mg at 09/11/23 0815   empagliflozin  (JARDIANCE ) tablet 25 mg, 25 mg, Oral, Daily, Rosemarie, Pramod S, MD, 25 mg at 09/11/23 0815   insulin  aspart (novoLOG) injection 0-15 Units, 0-15 Units, Subcutaneous, TID WC, Arora, Ashish, MD, 2 Units at 09/11/23 9348   rosuvastatin  (CRESTOR ) tablet 40 mg, 40 mg, Oral, Daily, Rosemarie, Pramod S, MD, 40 mg at 09/11/23 0815   senna-docusate (Senokot-S) tablet 1 tablet, 1 tablet, Oral, QHS PRN, Arora, Ashish, MD, 1 tablet at 09/10/23 2144   ticagrelor (BRILINTA) tablet 90 mg, 90 mg, Oral, BID, Sethi, Pramod S, MD, 90 mg at 09/11/23 0815  Patients Current Diet:  Diet Order             Diet heart healthy/carb modified Fluid consistency: Thin  Diet effective now                   Precautions / Restrictions Precautions Precautions: Fall Restrictions Weight Bearing Restrictions Per Provider Order: No   Has the patient had 2 or more falls or a fall with injury in the past year? No  Prior Activity Level Community (5-7x/wk): indep prior to admit, no AD, is an elementary school principal in [place]  Prior Functional Level Self Care: Did the patient need help bathing, dressing, using the toilet or eating? Independent  Indoor Mobility: Did the patient need assistance with walking from room to room (with or without device)? Independent  Stairs: Did the patient need assistance with internal or external stairs (with or without device)? Independent  Functional Cognition: Did the patient need help planning regular tasks such as shopping or remembering to take medications? Independent  Patient Information Are you of Hispanic, Latino/a,or Spanish origin?: A.  No, not of Hispanic, Latino/a, or Spanish origin What is your race?: A. White Do you need or want an interpreter to communicate with a doctor or health care staff?: 0. No  Patient's Response To:  Health Literacy and Transportation Is the patient able to respond to health literacy and transportation needs?: Yes Health Literacy - How often do you need to have someone help you when you read instructions, pamphlets, or other written material from your doctor or pharmacy?: Never In the past 12 months, has lack of transportation kept you from medical appointments or from getting medications?: No In the past 12 months, has lack of transportation  kept you from meetings, work, or from getting things needed for daily living?: No  Home Assistive Devices / Equipment Home Equipment: None  Prior Device Use: Indicate devices/aids used by the patient prior to current illness, exacerbation or injury? None of the above  Current Functional Level Cognition  Arousal/Alertness: Awake/alert Overall Cognitive Status: Within Functional Limits for tasks assessed Orientation Level: Oriented X4    Extremity Assessment (includes Sensation/Coordination)  Upper Extremity Assessment: RUE deficits/detail RUE Deficits / Details: 4/5 globally, impaired [facility] RUE Coordination: decreased fine motor  Lower Extremity Assessment: Defer to PT evaluation RLE Deficits / Details: pt describes feeling of heaviness; 4+/5 throughout RLE Sensation: WNL RLE Coordination: decreased fine motor, decreased gross motor (slight dyscoordination toe tapping and with placing activities with RLE)    ADLs  Overall ADL's : Needs assistance/impaired Eating/Feeding: Set up, Sitting Grooming: Set up, Standing Grooming Details (indicate cue type and reason): shaves face standing in front of mirror Upper Body Bathing: Contact guard assist, Sitting Lower Body Bathing: Contact guard assist, Sitting/lateral leans Upper Body Dressing : Contact  guard assist, Sitting Lower Body Dressing: Contact guard assist, Sitting/lateral leans Toilet Transfer: Supervision/safety, Contact guard assist Toilet Transfer Details (indicate cue type and reason): holds on to bedrail/countertop with unilateral UE support with short distance mobility Toileting- Clothing Manipulation and Hygiene: Contact guard assist Functional mobility during ADLs: Supervision/safety    Mobility  Overal bed mobility: Needs Assistance Bed Mobility: Supine to Sit Supine to sit: Supervision Sit to supine: Supervision General bed mobility comments: in recliner upon arrival    Transfers  Overall transfer level: Needs assistance Equipment used: None Transfers: Sit to/from Stand Sit to Stand: Supervision General transfer comment: supervision for safety    Ambulation / Gait / Stairs / Wheelchair Mobility  Ambulation/Gait Ambulation/Gait assistance: Min assist, Contact guard assist, Mod assist Gait Distance (Feet): 100 Feet (x3) Assistive device: None Gait Pattern/deviations: Step-through pattern, Decreased stride length, Decreased dorsiflexion - right, Decreased weight shift to right General Gait Details: slightly unsteady gait with decreased weight shift to R LE Gait velocity: decr Gait velocity interpretation: 1.31 - 2.62 ft/sec, indicative of limited community ambulator    Posture / Balance Balance Overall balance assessment: Needs assistance Sitting-balance support: Feet supported Sitting balance-Leahy Scale: Normal Standing balance support: Single extremity supported Standing balance-Leahy Scale: Fair High level balance activites: Side stepping, Backward walking, Turns, Sudden stops High Level Balance Comments: x14ft fast/slow/stop, x2 reps; x10 ft backwards walking x2 reps Standardized Balance Assessment Standardized Balance Assessment : Berg Balance Test Berg Balance Test Sit to Stand: Needs minimal aid to stand or to stabilize Standing Unsupported: Able  to stand 30 seconds unsupported Sitting with Back Unsupported but Feet Supported on Floor or Stool: Able to sit safely and securely 2 minutes Stand to Sit: Sits safely with minimal use of hands Transfers: Needs one person to assist Standing Unsupported with Eyes Closed: Able to stand 10 seconds with supervision Standing Ubsupported with Feet Together: Able to place feet together independently and stand for 1 minute with supervision From Standing, Reach Forward with Outstretched Arm: Can reach forward >12 cm safely (5) From Standing Position, Pick up Object from Floor: Able to pick up shoe, needs supervision From Standing Position, Turn to Look Behind Over each Shoulder: Looks behind one side only/other side shows less weight shift Turn 360 Degrees: Needs close supervision or verbal cueing Standing Unsupported, Alternately Place Feet on Step/Stool: Able to complete >2 steps/needs minimal assist Standing Unsupported, One Foot in Front: Needs help  to step but can hold 15 seconds Standing on One Leg: Unable to try or needs assist to prevent fall Total Score: 30    Special needs/care consideration Diabetic management yes   Previous Home Environment (from acute therapy documentation) Living Arrangements: Spouse/significant other, Children  Lives With: Spouse, Family Available Help at Discharge: Family Type of Home: House Home Layout: One level Home Access: Ramped entrance Bathroom Shower/Tub: Walk-in shower Home Care Services: No Additional Comments: In [place] has 3 floors, no steps to enter, walk in shower  Discharge Living Setting Plans for Discharge Living Setting: Lives with (comment) (staying with parents in the [place] , spouse/4 kids here as well) Type of Home at Discharge: House Discharge Home Layout: One level Discharge Home Access: Ramped entrance Discharge Bathroom Shower/Tub: Walk-in shower Discharge Bathroom Toilet: Standard Discharge Bathroom Accessibility: Yes How Accessible:  Accessible via walker Does the patient have any problems obtaining your medications?: No  Social/Family/Support Systems Patient Roles: Spouse, Parent Contact Information: has 4 school aged children Anticipated Caregiver: mod I to indep goals, spouse as well Anticipated Industrial/product designer Information: Gerrianne  [phone] Ability/Limitations of Caregiver: none stated Caregiver Availability: 24/7 Discharge Plan Discussed with Primary Caregiver: Yes Is Caregiver In Agreement with Plan?: Yes Does Caregiver/Family have Issues with Lodging/Transportation while Pt is in Rehab?: No  Goals Patient/Family Goal for Rehab: PT/OT modified indep to indep, SLP n/a Expected length of stay: 4-6 days Additional Information: Discharge plan: return to parents home (which is local) with parents, spouse, 4 kids Pt/Family Agrees to Admission and willing to participate: Yes Program Orientation Provided & Reviewed with Pt/Caregiver Including Roles  & Responsibilities: Yes  Decrease burden of Care through IP rehab admission: n/a  Possible need for SNF placement upon discharge: Not anticipated.  Plan discharge to parents home until return to [place] in August if able.  Has parents, spouse, and 4 children at home.   Patient Condition: I have reviewed medical records from [facility], spoken with [facility] team, and patient. I met with patient at the bedside for inpatient rehabilitation assessment.  Patient will benefit from ongoing PT and OT, can actively participate in 3 hours of therapy a day 5 days of the week, and can make measurable gains during the admission.  Patient will also benefit from the coordinated team approach during an Inpatient Acute Rehabilitation admission.  The patient will receive intensive therapy as well as Rehabilitation physician, nursing, social worker, and care management interventions.  Due to safety, disease management, medication administration, pain management, and patient education the patient  requires 24 hour a day rehabilitation nursing.  The patient is currently min assist or better with mobility and basic ADLs.  Discharge setting and therapy post discharge at home with outpatient is anticipated.  Patient has agreed to participate in the Acute Inpatient Rehabilitation Program and will admit today.  Preadmission Screen Completed By:  Reche FORBES Lowers, 09/11/2023 11:16 AM ______________________________________________________________________   Discussed status with Dr. Urbano on 09/11/23  at 11:24 AM  and received approval for admission today.  Admission Coordinator:  Caitlin E Warren, PT, time 09/11/23 Pattricia 11:24 AM    Assessment/Plan: Diagnosis: CVA Does the need for close, 24 hr/day Medical supervision in concert with the patient's rehab needs make it unreasonable for this patient to be served in a less intensive setting? Yes Co-Morbidities requiring supervision/potential complications: HTN, HLD, DM2, obesity, CAD< OSA Due to bladder management, bowel management, safety, skin/wound care, disease management, medication administration, pain management, and patient education, does the patient require  24 hr/day rehab nursing? Yes Does the patient require coordinated care of a physician, rehab nurse, PT, OT, and SLP to address physical and functional deficits in the context of the above medical diagnosis(es)? Yes Addressing deficits in the following areas: balance, endurance, locomotion, strength, transferring, bowel/bladder control, bathing, dressing, feeding, grooming, toileting, and psychosocial support Can the patient actively participate in an intensive therapy program of at least 3 hrs of therapy 5 days a week? Yes The potential for patient to make measurable gains while on inpatient rehab is excellent Anticipated functional outcomes upon discharge from inpatient rehab: independent and modified independent PT, independent and modified independent OT, n/a SLP Estimated rehab  length of stay to reach the above functional goals is: 4-6 days Anticipated discharge destination: Home 10. Overall Rehab/Functional Prognosis: excellent   MD Signature: Murray Collier

## 2023-09-12 ENCOUNTER — Telehealth: Payer: Self-pay | Admitting: Neurology

## 2023-09-12 ENCOUNTER — Other Ambulatory Visit: Payer: Self-pay | Admitting: Family Medicine

## 2023-09-12 ENCOUNTER — Inpatient Hospital Stay: Admitting: Cardiology

## 2023-09-12 DIAGNOSIS — E66811 Obesity, class 1: Secondary | ICD-10-CM | POA: Diagnosis not present

## 2023-09-12 DIAGNOSIS — R7989 Other specified abnormal findings of blood chemistry: Secondary | ICD-10-CM | POA: Diagnosis not present

## 2023-09-12 DIAGNOSIS — R17 Unspecified jaundice: Secondary | ICD-10-CM

## 2023-09-12 DIAGNOSIS — E1159 Type 2 diabetes mellitus with other circulatory complications: Secondary | ICD-10-CM

## 2023-09-12 DIAGNOSIS — I639 Cerebral infarction, unspecified: Secondary | ICD-10-CM | POA: Diagnosis not present

## 2023-09-12 LAB — CBC WITH DIFFERENTIAL/PLATELET
Abs Immature Granulocytes: 0.03 10*3/uL (ref 0.00–0.07)
Basophils Absolute: 0 10*3/uL (ref 0.0–0.1)
Basophils Relative: 0 %
Eosinophils Absolute: 0.1 10*3/uL (ref 0.0–0.5)
Eosinophils Relative: 2 %
HCT: 48.3 % (ref 39.0–52.0)
Hemoglobin: 16.8 g/dL (ref 13.0–17.0)
Immature Granulocytes: 0 %
Lymphocytes Relative: 30 %
Lymphs Abs: 2.3 10*3/uL (ref 0.7–4.0)
MCH: 32.6 pg (ref 26.0–34.0)
MCHC: 34.8 g/dL (ref 30.0–36.0)
MCV: 93.6 fL (ref 80.0–100.0)
Monocytes Absolute: 0.6 10*3/uL (ref 0.1–1.0)
Monocytes Relative: 7 %
Neutro Abs: 4.7 10*3/uL (ref 1.7–7.7)
Neutrophils Relative %: 61 %
Platelets: 225 10*3/uL (ref 150–400)
RBC: 5.16 MIL/uL (ref 4.22–5.81)
RDW: 12.1 % (ref 11.5–15.5)
WBC: 7.7 10*3/uL (ref 4.0–10.5)
nRBC: 0 % (ref 0.0–0.2)

## 2023-09-12 LAB — COMPREHENSIVE METABOLIC PANEL WITH GFR
ALT: 28 U/L (ref 0–44)
AST: 25 U/L (ref 15–41)
Albumin: 3.8 g/dL (ref 3.5–5.0)
Alkaline Phosphatase: 50 U/L (ref 38–126)
Anion gap: 11 (ref 5–15)
BUN: 24 mg/dL — ABNORMAL HIGH (ref 6–20)
CO2: 21 mmol/L — ABNORMAL LOW (ref 22–32)
Calcium: 8.9 mg/dL (ref 8.9–10.3)
Chloride: 102 mmol/L (ref 98–111)
Creatinine, Ser: 0.98 mg/dL (ref 0.61–1.24)
GFR, Estimated: >60 mL/min (ref >60)
Glucose, Bld: 132 mg/dL — ABNORMAL HIGH (ref 70–99)
Potassium: 3.5 mmol/L (ref 3.5–5.1)
Sodium: 134 mmol/L — ABNORMAL LOW (ref 135–145)
Total Bilirubin: 1.4 mg/dL — ABNORMAL HIGH (ref 0.0–1.2)
Total Protein: 6.3 g/dL — ABNORMAL LOW (ref 6.5–8.1)

## 2023-09-12 LAB — GLUCOSE, CAPILLARY
Glucose-Capillary: 130 mg/dL — ABNORMAL HIGH (ref 70–99)
Glucose-Capillary: 107 mg/dL — ABNORMAL HIGH (ref 70–99)
Glucose-Capillary: 283 mg/dL — ABNORMAL HIGH (ref 70–99)
Glucose-Capillary: 199 mg/dL — ABNORMAL HIGH (ref 70–99)

## 2023-09-12 MED ORDER — TRULICITY 1.5 MG/0.5ML ~~LOC~~ SOAJ: 1.5000 mg | SUBCUTANEOUS | 0 refills | Status: DC

## 2023-09-12 NOTE — Progress Notes (Signed)
 Met with patient to review current situation, team conference and plan of care. Reviewed medications, aspirin and brillinta x 4 wks  then brillinta alone. Reviewed follow up with pcp; s/s of stroke, diabetes, hypertension hyperlipidemia, depression. Continue to follow along to provide educational needs to facilitate preparation for discharge.

## 2023-09-12 NOTE — Progress Notes (Addendum)
 PROGRESS NOTE   Subjective/Complaints:  No acute events noted overnight.  He had a bowel movement today, took as needed sorbitol yesterday.  Would like IV removed for possible.  Reports has been compliant with CPAP.  He is going to try to work on healthy eating to keep blood vessels healthy.  ROS: Patient denies fever, new vision changes, dizziness, nausea, vomiting, diarrhea,  shortness of breath or chest pain, headache, or mood change.  Objective:   No results found. Recent Labs    09/10/23 0609 09/12/23 0514  WBC 8.2 7.7  HGB 15.8 16.8  HCT 46.5 48.3  PLT 231 225   Recent Labs    09/10/23 0609 09/12/23 0514  NA 137 134*  K 4.0 3.5  CL 106 102  CO2 22 21*  GLUCOSE 136* 132*  BUN 22* 24*  CREATININE 0.81 0.98  CALCIUM  8.7* 8.9    Intake/Output Summary (Last 24 hours) at 09/12/2023 1153 Last data filed at 09/12/2023 0851 Gross per 24 hour  Intake 236 ml  Output --  Net 236 ml        Physical Exam: Vital Signs Blood pressure 121/77, pulse 77, temperature 97.6 F (36.4 C), temperature source Oral, resp. rate 18, height 5' 3 (1.6 m), weight 83.9 kg, SpO2 99%.  General: No apparent distress HEENT: [place],AT, MMM Neck: Supple without JVD or lymphadenopathy Heart: Reg rate and rhythm. No murmurs rubs or gallops, cardiac monitor L chest Chest: CTA bilaterally without wheezes, rales, or rhonchi; no distress Abdomen: Soft, non-tender, non-distended, bowel sounds positive. Extremities: No clubbing, cyanosis, or edema. Pulses are 2+ Psych: Pt's affect is appropriate. Pt is cooperative. Pleasant  Skin: Clean and intact without signs of breakdown Neuro: Alert and oriented x 3, good insight and awareness, fluent, follows commands, right facial weakness,    MOTOR: RUE: 4/5 Deltoid, 4/5 Biceps, 4+/5 Triceps,4/5 Grip LUE: 5/5 Deltoid, 5/5 Biceps, 5/5 Triceps, 5/5 Grip RLE: HF 4/5, KE 4/5, ADF 4+/5, APF 4+/5 LLE: HF  5/5, KE 5/5, ADF 5/5, APF 5/5   SENSORY: Normal to touch all 4 extremities   No hypertonia noted   Coordination: Normal finger to nose and heel to shin, no tremor, no dysmetria      Assessment/Plan: 1. Functional deficits which require 3+ hours per day of interdisciplinary therapy in a comprehensive inpatient rehab setting. Physiatrist is providing close team supervision and 24 hour management of active medical problems listed below. Physiatrist and rehab team continue to assess barriers to discharge/monitor patient progress toward functional and medical goals  Care Tool:  Bathing    Body parts bathed by patient: Right arm, Left arm, Chest, Abdomen, Front perineal area, Right upper leg, Buttocks, Left upper leg, Right lower leg, Left lower leg, Face         Bathing assist Assist Level: Supervision/Verbal cueing     Upper Body Dressing/Undressing Upper body dressing   What is the patient wearing?: Pull over shirt    Upper body assist Assist Level: Independent with assistive device    Lower Body Dressing/Undressing Lower body dressing      What is the patient wearing?: Underwear/pull up, Pants     Lower body assist  Assist for lower body dressing: Supervision/Verbal cueing     Toileting Toileting    Toileting assist Assist for toileting: Supervision/Verbal cueing     Transfers Chair/bed transfer  Transfers assist     Chair/bed transfer assist level: Supervision/Verbal cueing     Locomotion Ambulation   Ambulation assist              Walk 10 feet activity   Assist           Walk 50 feet activity   Assist           Walk 150 feet activity   Assist           Walk 10 feet on uneven surface  activity   Assist           Wheelchair     Assist               Wheelchair 50 feet with 2 turns activity    Assist            Wheelchair 150 feet activity     Assist          Blood pressure  121/77, pulse 77, temperature 97.6 F (36.4 C), temperature source Oral, resp. rate 18, height 5' 3 (1.6 m), weight 83.9 kg, SpO2 99%.  Medical Problem List and Plan: 1. Functional deficits secondary to left paramedian pontine CVA s/p TNK             -patient may  shower             -ELOS/Goals: PT, OT modified to independent, 4 to 6 days             - Continue CIR  - Okay to remove IV 2.  Antithrombotics: -DVT/anticoagulation:  Pharmaceutical: Lovenox added             -antiplatelet therapy: ASA/Brilinta X 4 weeks followed by Brilinta alone.  3. Pain Management: Tylenol prn.  4. Mood/Behavior/Sleep: LCSW to follow for evaluation and support             -antipsychotic agents: N/A 5. Neuropsych/cognition: This patient is capable of making decisions on his own behalf. 6. Skin/Wound Care: Routine pressure relief measures.  7. Fluids/Electrolytes/Nutrition: Monitor I/O. Check CMET in am 8.  HTN: Monitor BP TID--was on Bisoprolol and Lisinopril .  BP goal less than 180/105 --Continues on low dose Bisoprolol. Has had HR improving.  -BP controlled, continue current regimen and monitor    09/12/2023    9:00 AM 09/12/2023    5:17 AM 09/12/2023    5:13 AM  Vitals with BMI  Weight  184 lbs 15 oz   BMI  32.77   Systolic 121  120  Diastolic 77  76  Pulse 77  64    9. T2DM: Hgb A1c-8.1. Was on tresiba  60 units, trulicity  and jardiance  at home--compliance?             --BS relatively ranging from 100-160. Will resume insulin  glargine at 10 units HS             --encourage fluid intake as on Jardiance  and BUN trending up.  CBG (last 3)  Recent Labs    09/11/23 0634 09/11/23 1134 09/11/23 1741  GLUCAP 121* 113* 99  CBGs controlled, continue to follow    10. Pre-renal azotemia: BUN- 22. Encourage fluid intake.  Recheck labs -7/3 BUNs a little higher at 24, encourage oral fluids and monitor for now, asked nursing to enourage  11. OSA: Has been compliant with CPAP use.  Discussed benefits of  CPAP 12. Dyslipidemia: Trig-155/LDL-98.  Statin 13. Obesity class I: Educated on diet and exercise to promote health and weight loss.  Dietitian to see patient             Body mass index is 33.16 kg/m. 14. CAD DES to LAD 07/11/20.  Continue ASA/Brilinta as above 15. Constipation: PRN senokot, PRN sorbitol  - 7/3 improved after bowel movement today 16.  Elevated bilirubin: Recheck labs Monday      LOS: 1 days A FACE TO FACE EVALUATION WAS PERFORMED  Murray Collier 09/12/2023, 11:53 AM

## 2023-09-12 NOTE — Plan of Care (Signed)
  Problem: RH Balance Goal: LTG Patient will maintain dynamic standing with ADLs (OT) Description: LTG:  Patient will maintain dynamic standing balance with assist during activities of daily living (OT)  Flowsheets (Taken 09/12/2023 1023) LTG: Pt will maintain dynamic standing balance during ADLs with: Independent with assistive device   Problem: RH Bathing Goal: LTG Patient will bathe all body parts with assist levels (OT) Description: LTG: Patient will bathe all body parts with assist levels (OT) Flowsheets (Taken 09/12/2023 1023) LTG: Pt will perform bathing with assistance level/cueing: Independent with assistive device    Problem: RH Dressing Goal: LTG Patient will perform lower body dressing w/assist (OT) Description: LTG: Patient will perform lower body dressing with assist, with/without cues in positioning using equipment (OT) Flowsheets (Taken 09/12/2023 1023) LTG: Pt will perform lower body dressing with assistance level of: Independent with assistive device   Problem: RH Toileting Goal: LTG Patient will perform toileting task (3/3 steps) with assistance level (OT) Description: LTG: Patient will perform toileting task (3/3 steps) with assistance level (OT)  Flowsheets (Taken 09/12/2023 1023) LTG: Pt will perform toileting task (3/3 steps) with assistance level: Independent with assistive device   Problem: RH Functional Use of Upper Extremity Goal: LTG Patient will use RT/LT upper extremity as a (OT) Description: LTG: Patient will use right/left upper extremity as a stabilizer/gross assist/diminished/nondominant/dominant level with assist, with/without cues during functional activity (OT) Flowsheets (Taken 09/12/2023 1023) LTG: Use of upper extremity in functional activities: RUE as nondominant level LTG: Pt will use upper extremity in functional activity with assistance level of: Independent with assistive device   Problem: RH Simple Meal Prep Goal: LTG Patient will perform simple  meal prep w/assist (OT) Description: LTG: Patient will perform simple meal prep with assistance, with/without cues (OT). Flowsheets (Taken 09/12/2023 1023) LTG: Pt will perform simple meal prep with assistance level of: Independent with assistive device   Problem: RH Light Housekeeping Goal: LTG Patient will perform light housekeeping w/assist (OT) Description: LTG: Patient will perform light housekeeping with assistance, with/without cues (OT). Flowsheets (Taken 09/12/2023 1023) LTG: Pt will perform light housekeeping with assistance level of: Independent with assistive device   Problem: RH Toilet Transfers Goal: LTG Patient will perform toilet transfers w/assist (OT) Description: LTG: Patient will perform toilet transfers with assist, with/without cues using equipment (OT) Flowsheets (Taken 09/12/2023 1023) LTG: Pt will perform toilet transfers with assistance level of: Independent with assistive device   Problem: RH Tub/Shower Transfers Goal: LTG Patient will perform tub/shower transfers w/assist (OT) Description: LTG: Patient will perform tub/shower transfers with assist, with/without cues using equipment (OT) Flowsheets (Taken 09/12/2023 1023) LTG: Pt will perform tub/shower stall transfers with assistance level of: Independent with assistive device

## 2023-09-12 NOTE — Progress Notes (Signed)
 Occupational Therapy Session Note  Patient Details  Name: Tyler Hoffman MRN: [id number] Date of Birth: February 08, 1976  Today's Date: 09/12/2023 OT Individual Time: 8573-8445 OT Individual Time Calculation (min): 88 min   Short Term Goals: Week 1:  OT Short Term Goal 1 (Week 1): STGs=LTGs due to patient's estimated length of stay.  Skilled Therapeutic Interventions/Progress Updates:  Pt greeted sitting in recliner for skilled OT session with focus on functional mobility and RUE/RLE NMR.   Pain: Pt with no reports of pain. OT offering intermediate rest breaks and positioning suggestions throughout session to address pain/fatigue and maximize participation/safety in session.   Functional Transfers: All transfers at supervision level, more than household level ambulation in similar fashion with no AD.   Therapeutic Activities: Pt engaged in series of mobility tasks to challenge RLE/RLE NMR, dynamic balance, reaction times/agility all to decrease fall risk and promote independence with BADLs/IADLs, details below:  Bouts of mobility with addition of bilateral/single handed toss of weighted ball, no instances of dropping, supervision level  Pt engages in ~5 mins long basket ball game. Pt able to reach multi-directionally to retreive ball, performed at CGA level.  Pt ambulates back to room, coming to an abrupt stop as called out by therapist. Cuing required to increase gait speed, pt self correcting step width.   Therapeutic Exercise: Pt completes 1x15 reps of sit<>stands with LLE propped on 4in step for improved NMR. Followed by 2x15 reps of same exercise with inclusion chest press and overhead press with 3# dowel bar for BUE integration/strengthening.  Education: Time dedicated to educating spouse of patient's CLOF and estimated LOS. Spouse asking questions about follow-up care, deferred medical questions to MD/PA, but encouraged OPOT at a neuro-focused clinic.   Pt remained resting in bed with 4Ps  assessed and immediate needs met. Pt continues to be appropriate for skilled OT intervention to promote further functional independence in ADLs/IADLs.   Therapy Documentation Precautions:  Precautions Precautions: Fall Precaution/Restrictions Comments: Mild R-sided hemiparesis Restrictions Weight Bearing Restrictions Per Provider Order: No   Therapy/Group: Individual Therapy  Nereida Habermann, OTR/L, MSOT  09/12/2023, 10:25 AM

## 2023-09-12 NOTE — Discharge Instructions (Addendum)
 Inpatient Rehab Discharge Instructions  Tyler Hoffman Discharge date and time:  09/16/23  Activities/Precautions/ Functional Status: Activity: no lifting, driving, or strenuous exercise till cleared by MD Diet: cardiac diet and diabetic diet Wound Care: none needed   Functional status:  ___ No restrictions     ___ Walk up steps independently ___ 24/7 supervision/assistance   ___ Walk up steps with assistance ___ Intermittent supervision/assistance  ___ Bathe/dress independently ___ Walk with walker     ___ Bathe/dress with assistance _X__ Walk Independently    _X__ Shower independently ___ Walk with assistance    ___ Shower with assistance _X__ No alcohol     ___ Return to work/school ________  Special Instructions: Use CPAP at nights and whenever sleeping.  2.  Increase Tresiba  by 10 units daily if blood sugars are over 200.    COMMUNITY REFERRALS UPON DISCHARGE:     Outpatient: PT      OT              Agency:  [facility]    Phone: [phone]             Appointment Date/Time:*Please expect follow-up within 7-10 business days to schedule your appointment. If you have not received follow-up, be sure to contact the site directly.*       STROKE/TIA DISCHARGE INSTRUCTIONS SMOKING Cigarette smoking nearly doubles your risk of having a stroke & is the single most alterable risk factor  If you smoke or have smoked in the last 12 months, you are advised to quit smoking for your health. Most of the excess cardiovascular risk related to smoking disappears within a year of stopping. Ask you doctor about anti-smoking medications [place] Quit Line: 1-800-QUIT NOW Free Smoking Cessation Classes [phone]  CHOLESTEROL Know your levels; limit fat & cholesterol in your diet  Lipid Panel     Component Value Date/Time   CHOL 171 09/09/2023 0724   TRIG 155 (H) 09/09/2023 0724   HDL 42 09/09/2023 0724   CHOLHDL 4.1 09/09/2023 0724   VLDL 31 09/09/2023 0724   LDLCALC 98 09/09/2023  0724     Many patients benefit from treatment even if their cholesterol is at goal. Goal: Total Cholesterol (CHOL) less than 160 Goal:  Triglycerides (TRIG) less than 150 Goal:  HDL greater than 40 Goal:  LDL (LDLCALC) less than 100   BLOOD PRESSURE American Stroke Association blood pressure target is less that 120/80 mm/Hg  Your discharge blood pressure is:  BP: 106/71 Monitor your blood pressure Limit your salt and alcohol intake Many individuals will require more than one medication for high blood pressure  DIABETES (A1c is a blood sugar average for last 3 months) Goal HGBA1c is under 7% (HBGA1c is blood sugar average for last 3 months)  Diabetes:     Lab Results  Component Value Date   HGBA1C 8.1 (H) 08/30/2023    Your HGBA1c can be lowered with medications, healthy diet, and exercise. Check your blood sugar as directed by your physician Call your physician if you experience unexplained or low blood sugars.  PHYSICAL ACTIVITY/REHABILITATION Goal is 30 minutes at least 4 days per week  Activity: No driving, Therapies: see above Return to work: to be decided on follow up Activity decreases your risk of heart attack and stroke and makes your heart stronger.  It helps control your weight and blood pressure; helps you relax and can improve your mood. Participate in a regular exercise program. Talk with your doctor about  the best form of exercise for you (dancing, walking, swimming, cycling).  DIET/WEIGHT Goal is to maintain a healthy weight  Your discharge diet is:  Diet Order             Diet heart healthy/carb modified Fluid consistency: Thin  Diet effective now                   liquids Your height is:  Height: 5' 3 (160 cm) Your current weight is: Weight: 83.9 kg Your Body Mass Index (BMI) is:  BMI (Calculated): 32.77 Following the type of diet specifically designed for you will help prevent another stroke. Your goal weight is:   Your goal Body Mass Index (BMI) is  19-24. Healthy food habits can help reduce 3 risk factors for stroke:  High cholesterol, hypertension, and excess weight.  RESOURCES Stroke/Support Group:  Call [phone]   STROKE EDUCATION PROVIDED/REVIEWED AND GIVEN TO PATIENT Stroke warning signs and symptoms How to activate emergency medical system (call 911). Medications prescribed at discharge. Need for follow-up after discharge. Personal risk factors for stroke. Pneumonia vaccine given:  Flu vaccine given:  My questions have been answered, the writing is legible, and I understand these instructions.  I will adhere to these goals & educational materials that have been provided to me after my discharge from the hospital.     My questions have been answered and I understand these instructions. I will adhere to these goals and the provided educational materials after my discharge from the hospital.  Patient/Caregiver Signature _______________________________ Date __________  Clinician Signature _______________________________________ Date __________  Please bring this form and your medication list with you to all your follow-up doctor's appointments.

## 2023-09-12 NOTE — Progress Notes (Signed)
 Inpatient Rehabilitation Care Coordinator Assessment and Plan Patient Details  Name: Tyler Hoffman MRN: [id number] Date of Birth: 11-May-1975  Today's Date: 09/12/2023  Hospital Problems: Principal Problem:   Left pontine stroke [facility])  Past Medical History:  Past Medical History:  Diagnosis Date   CAD S/P DES PCI-LAD (08/2020 - in [place]) 07/19/2020   CTA 07/11/2020 (performed in [place]): In response to abnormal functional study. Coronary Calcium  Score 0.3.  LAD and circumflex have separate ostia.  Appears to be occlusion of the LAD after SP1.  LCx gives off 1 major marginal branch with no significant disease.  RCA has diffuse atheroma with mod stenosis prior to Crux.  LVEF Nl Systolic Fxn. ->  Cath = DES PCI prox LAD SubTO, FFR Neg RCA.   Essential hypertension 05/22/2016   History of rosacea    Hyperlipidemia associated with type 2 diabetes mellitus (HCC) 08/22/2006   Qualifier: Diagnosis of  By: Baird FNP, Frieda Kipper    Lacunar infarction (HCC) 05/2016   No residual infarct   Obesity (BMI 30-39.9) 10/16/2013   OSA on CPAP    Uses CPAP   Type II diabetes mellitus with complication (HCC)    [place] CVA &  CAD- PCI LAD   Past Surgical History:  Past Surgical History:  Procedure Laterality Date   CORONARY STENT INTERVENTION  08/31/2020   [place], [place]: DES PCI LAD (subtotal occlusion ~ 7 mm, downstream of SP1); - Unknowns STENT Size/ Brand..   CT CTA CORONARY W/CA SCORE W/CM &/OR WO/CM  07/11/2020   Performed in [place] in response to abnormal functional study: o Coronary Calcium  Score 0.3.  LAD and circumflex have separate ostia.  Appears to be occlusion of the LAD after SP1.  LCx gives off 1 major marginal branch with no significant disease.  RCA has diffuse atheroma with moderate stenosis prior to the crux.  LVEF normal systolic function. ->  Coronary angiography recommended.   LEFT HEART CATH AND CORONARY ANGIOGRAPHY  08/31/2020   [place], [place]): Two-vessel CAD with subtotal  occlusion just distal to SP1.  Subcritical stenosis of the right coronary artery (IFR and FFR negative). => LAD PCI performed with good result.  (Right radial access)   nasoplasty  08/11/2003   TRANSTHORACIC ECHOCARDIOGRAM  06/07/2016   With bubble study for CVA evaluation: () normal LV function.  EF 66 5%.  No RWMA.  GR 1 DD.  No IAS, PFO or ASD noted.  No R-L shunt.  Normal valves.   TRANSTHORACIC ECHOCARDIOGRAM  12/2018   [place], [place]:ormal biventricular function.  EF 65%.  Normal wall motion.  No significant valvular disease.  No pleural effusion.  Normal aorta within the level of portable section.   TRANSTHORACIC ECHOCARDIOGRAM  08/2020   [place], [place]: Normal EF. evidence of hypertensive heart disease with normal biventricular function.  Normal aortic.  Normal valves.   Social History:  reports that he has never smoked. He has never used smokeless tobacco. He reports current alcohol use. He reports that he does not use drugs.  Family / Support Systems Marital Status: Married How Long?: 14 years Patient Roles: Spouse, Parent Spouse/Significant Other: Gerrianne (wife) [phone] Children: 4 children- 16, 50, 10, and 9 Other Supports: while in [place] - his parents and aunt Anticipated Caregiver: Wife Ability/Limitations of Caregiver: Wife works FT and cares for children; pt needs to be as independent as possible. Wife will provide necessary support. Caregiver Availability: 24/7 Family Dynamics: Pt lives with wife and four children.  Social History Preferred language:  English Religion: Unknown Cultural Background: Pt has been working as a Magazine features editor. 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 Return to Work Plans: TBD Market researcher Issues: Denies Electronics engineer: Denies   Abuse/Neglect Abuse/Neglect Assessment Can Be Completed: Yes Physical Abuse: Denies Verbal Abuse: Denies Sexual Abuse: Denies Exploitation of patient/patient's resources: Denies Self-Neglect: Denies  Patient response to: Social Isolation - How often do you feel lonely or isolated from those around you?: Rarely  Emotional Status Pt's affect, behavior and adjustment status: Pt in good spirits at time of visit Recent Psychosocial Issues: Denies Psychiatric History: Denies Substance Abuse History: Denies tobacco products or rec drug use; admits to occassional wine  Patient / Family Perceptions, Expectations & Goals Pt/Family understanding of illness & functional limitations: Pt and family have a general understanding of care needs Premorbid pt/family roles/activities: Independent Anticipated changes in roles/activities/participation: Assistance with ADLs/IADLs Pt/family expectations/goals: Pt goal is to work on independence, mobility, confidence, and understands he will have to make a life change moving forward with his health.  Community Resources [employer]: None Premorbid Home Care/DME Agencies: None Transportation available at discharge: TBD Is the patient able to respond to transportation needs?: Yes In the past 12 months, has lack of transportation kept you from medical appointments or from getting medications?: No In the past 12 months, has lack of transportation kept you from meetings, work, or from getting things needed for daily living?: No Resource referrals recommended: Neuropsychology  Discharge Planning Living Arrangements: Spouse/significant other, Children, Parent, Other relatives Support Systems: Spouse/significant other, Children, Other relatives Type of Residence: Private residence Insurance Resources: Media planner (specify) Administrator) Financial Resources: Employment Surveyor, quantity Screen  Referred: No Living Expenses: Banker Management: Patient Does the patient have any problems obtaining your medications?: No Home Management: Pt and wife share home care duties; hire a houskeeper to help aide with hous needs as well Patient/Family Preliminary Plans: TBD Care Coordinator Barriers to Discharge: Insurance for SNF coverage, Decreased caregiver support, Lack of/limited family support Care Coordinator Anticipated Follow Up Needs: HH/OP Expected length of stay: short ELOS 3-7 days  Clinical Impression SW met with pt in room to introduce self, explain role, and discuss discharge process. Pt is not a Cytogeneticist. No HCPOA. No DME. Permission to contact his wife.   Labarron Durnin A Eda Magnussen 09/12/2023, 2:06 PM

## 2023-09-12 NOTE — Telephone Encounter (Signed)
 Hey Dr. Rosemarie,  I spoke with Tyler Hoffman and his wife Tyler Hoffman in regards to scheduling a follow up with you. You saw him yesterday in the hospital, and he mentioned you had said to follow up in 3 months, but they have some concerns about that timeframe. They live in [place] full time and are supposed to fly back on the 19th, but are wondering if this is a safe trip for him to make? You do have a spot available on 7/8 that I've blocked off just in case, but I wasn't sure if seeing him that soon would be beneficial or needed for this. Please advise.  Thank you. Also, if needed, wife Tyler Hoffman can be reached at [phone]

## 2023-09-12 NOTE — Plan of Care (Signed)
  Problem: RH Balance Goal: LTG Patient will maintain dynamic standing balance (PT) Description: LTG:  Patient will maintain dynamic standing balance with assistance during mobility activities (PT) Flowsheets (Taken 09/12/2023 1419) LTG: Pt will maintain dynamic standing balance during mobility activities with:: Independent   Problem: Sit to Stand Goal: LTG:  Patient will perform sit to stand with assistance level (PT) Description: LTG:  Patient will perform sit to stand with assistance level (PT) Flowsheets (Taken 09/12/2023 1419) LTG: PT will perform sit to stand in preparation for functional mobility with assistance level: Independent   Problem: RH Bed to Chair Transfers Goal: LTG Patient will perform bed/chair transfers w/assist (PT) Description: LTG: Patient will perform bed to chair transfers with assistance (PT). Flowsheets (Taken 09/12/2023 1419) LTG: Pt will perform Bed to Chair Transfers with assistance level: Independent   Problem: RH Car Transfers Goal: LTG Patient will perform car transfers with assist (PT) Description: LTG: Patient will perform car transfers with assistance (PT). Flowsheets (Taken 09/12/2023 1419) LTG: Pt will perform car transfers with assist:: Independent   Problem: RH Ambulation Goal: LTG Patient will ambulate in home environment (PT) Description: LTG: Patient will ambulate in home environment, # of feet with assistance (PT). Flowsheets (Taken 09/12/2023 1419) LTG: Pt will ambulate in home environ  assist needed:: Independent LTG: Ambulation distance in home environment: 50' Goal: LTG Patient will ambulate in community environment (PT) Description: LTG: Patient will ambulate in community environment, # of feet with assistance (PT). Flowsheets (Taken 09/12/2023 1419) LTG: Pt will ambulate in community environ  assist needed:: Independent LTG: Ambulation distance in community environment: 300'   Problem: RH Stairs Goal: LTG Patient will ambulate up and down  stairs w/assist (PT) Description: LTG: Patient will ambulate up and down # of stairs with assistance (PT) Flowsheets (Taken 09/12/2023 1419) LTG: Pt will ambulate up/down stairs assist needed:: Independent LTG: Pt will  ambulate up and down number of stairs: 12

## 2023-09-12 NOTE — Progress Notes (Signed)
 Inpatient Rehabilitation Admission Medication Review by a Pharmacist  A complete drug regimen review was completed for this patient to identify any potential clinically significant medication issues.  High Risk Drug Classes Is patient taking? Indication by Medication  Antipsychotic Yes, as an intravenous medication Compazine - n/v   Anticoagulant Yes Lovenox- VTE ppx   Antibiotic No   Opioid No   Antiplatelet Yes bASA, ticagrelor (DAPT x 4 weeks and then ticagrelor monotherapy-stop entered)- CVA   Hypoglycemics/insulin  Yes Empagliflozin , dulaglutide , insulin   Vasoactive Medication Yes Bisoprolol - CAD   Chemotherapy No   Other Yes fleet enema , sorbitol , bisacodyl , Senokot-S , and - constipation Maalox- indigestion Diphenhydramine - itching  Acetaminophen- pain   trazodone and -insomnia Crestor - HLD      Type of Medication Issue Identified Description of Issue Recommendation(s)  Drug Interaction(s) (clinically significant)     Duplicate Therapy     Allergy     No Medication Administration End Date     Incorrect Dose     Additional Drug Therapy Needed     Significant med changes from prior encounter (inform family/care partners about these prior to discharge). Change- clopidogrel>>ticagrelor   PTA meds not resumed- lisinopril  (pt was not taking), tadalafil  Communicate relevant medication changes to patient/family members at discharge from CIR.   Restart or discontinue PTA meds not resumed in CIR at discharge if clinically indicated.   Other       Clinically significant medication issues were identified that warrant physician communication and completion of prescribed/recommended actions by midnight of the next day:  No  Name of provider notified for urgent issues identified:   Provider Method of Notification:     Pharmacist comments: If sole indication for bisoprolol is CAD, could consider discontinuation if he has recurrent bradycardia given his preserved ejection  fraction ([id number])   Time spent performing this drug regimen review (minutes):  20  Massie Fila, PharmD Clinical Pharmacist  09/12/2023 7:15 AM

## 2023-09-12 NOTE — Progress Notes (Signed)
 Patient ID: Tyler Hoffman, male   DOB: 1975/09/02, 48 y.o.   MRN: [id number]  1328- SW spoke with [name] to introduce self, explain role, and discuss discharge. SW informed wife that he will likely discharge on Monday. She expressed medical concerns with coordination of care needs when he goes back to [place]. SW requested a physician to follow-up with his wife. SW shared will confirm d/c will be Monday- as of now no DME or family edu needed; recs include outpatient PT/OT.  Graeme Jude, MSW, LCSW Office: [phone] Cell: [phone] Fax: [phone]

## 2023-09-12 NOTE — Progress Notes (Signed)
 Inpatient Rehabilitation  Patient information reviewed and entered into eRehab system by Cheri Rous, OTR/L, Rehab Quality Coordinator.   Information including medical coding, functional ability and quality indicators will be reviewed and updated through discharge.

## 2023-09-12 NOTE — Evaluation (Signed)
 Occupational Therapy Assessment and Plan  Patient Details  Name: Tyler Hoffman MRN: [id number] Date of Birth: 01/03/1976  OT Diagnosis: muscle weakness (generalized), dominant-side hemiparesis, decreased balance strategies, and decreased activity tolerance Rehab Potential: Rehab Potential (ACUTE ONLY): Good ELOS: 5-7 days   Today's Date: 09/12/2023 OT Individual Time: 0910-1005 OT Individual Time Calculation (min): 55 min     Hospital Problem: Principal Problem:   Left pontine stroke [facility])   Past Medical History:  Past Medical History:  Diagnosis Date   CAD S/P DES PCI-LAD (08/2020 - in [place]) 07/19/2020   CTA 07/11/2020 (performed in [place]): In response to abnormal functional study. Coronary Calcium  Score 0.3.  LAD and circumflex have separate ostia.  Appears to be occlusion of the LAD after SP1.  LCx gives off 1 major marginal branch with no significant disease.  RCA has diffuse atheroma with mod stenosis prior to Crux.  LVEF Nl Systolic Fxn. ->  Cath = DES PCI prox LAD SubTO, FFR Neg RCA.   Essential hypertension 05/22/2016   History of rosacea    Hyperlipidemia associated with type 2 diabetes mellitus (HCC) 08/22/2006   Qualifier: Diagnosis of  By: Baird FNP, Frieda Kipper    Lacunar infarction (HCC) 05/2016   No residual infarct   Obesity (BMI 30-39.9) 10/16/2013   OSA on CPAP    Uses CPAP   Type II diabetes mellitus with complication (HCC)    [place] CVA &  CAD- PCI LAD   Past Surgical History:  Past Surgical History:  Procedure Laterality Date   CORONARY STENT INTERVENTION  08/31/2020   [place], [place]: DES PCI LAD (subtotal occlusion ~ 7 mm, downstream of SP1); - Unknowns STENT Size/ Brand..   CT CTA CORONARY W/CA SCORE W/CM &/OR WO/CM  07/11/2020   Performed in [place] in response to abnormal functional study: o Coronary Calcium  Score 0.3.  LAD and circumflex have separate ostia.  Appears to be occlusion of the LAD after SP1.  LCx gives off 1 major marginal branch with no  significant disease.  RCA has diffuse atheroma with moderate stenosis prior to the crux.  LVEF normal systolic function. ->  Coronary angiography recommended.   LEFT HEART CATH AND CORONARY ANGIOGRAPHY  08/31/2020   [place], [place]): Two-vessel CAD with subtotal occlusion just distal to SP1.  Subcritical stenosis of the right coronary artery (IFR and FFR negative). => LAD PCI performed with good result.  (Right radial access)   nasoplasty  08/11/2003   TRANSTHORACIC ECHOCARDIOGRAM  06/07/2016   With bubble study for CVA evaluation: ([place]) normal LV function.  EF 66 5%.  No RWMA.  GR 1 DD.  No IAS, PFO or ASD noted.  No R-L shunt.  Normal valves.   TRANSTHORACIC ECHOCARDIOGRAM  12/2018   [place], [place]:ormal biventricular function.  EF 65%.  Normal wall motion.  No significant valvular disease.  No pleural effusion.  Normal aorta within the level of portable section.   TRANSTHORACIC ECHOCARDIOGRAM  08/2020   [place], [place]: Normal EF. evidence of hypertensive heart disease with normal biventricular function.  Normal aortic.  Normal valves.    Assessment & Plan Clinical Impression: Patient is a 48 year old male with histoyr of T2DM, OSA- CPAP, lacunar infarct, CAD s/p DES, obesity-BMI 33 who was admitted on 09/08/23 with sudden onset of RLE and RUE weakness. CTA negative for LVO with advanced intracranial atherosclerosis.  CT head negative for bleed and patient received TNK. MRI brain done revealing 9 mm acute infarct in left paramedian pons.  Dr. Rosemarie felt that stroke due to small vessel disease and recommended changing Plavix to Brilinta with DAPT X 4 week followed by Brilinta alone.    Patient had had bradycardia with HR in 30's and cardiology consulted for input as patient also reported DOE but no CP with three flights of stairs.  2D echo showed EF 60-65% with normal LVEF. No evidence of PAF and bradycardia felt to be in setting of stroke. Zebeta resumed and 2 week cardiac monitor placed on   07/01 with plans to review on outpatient basis after completion.  Patient has a cardiologist in [place].  Therapy consulted and patient with slightly unsteady gait with decreased weight shifting on RLE. No BM in few days. He currently requiring supervision with ADLs and Supervision to mod assist with activity.  Patient lives in [place] --principal on [address] and visiting this summer. He was independent and working PTA. CIR recommended due to functional decline.  Patient transferred to CIR on 09/11/2023 .    Patient currently requires supervision with basic self-care skills secondary to muscle weakness, decreased cardiorespiratoy endurance, unbalanced muscle activation and decreased coordination, and decreased balance strategies.  Prior to hospitalization, patient could complete BADLs/IADLs independently.   Patient will benefit from skilled intervention to increase independence with basic self-care skills and increase level of independence with iADL prior to discharge home with care partner.  Anticipate patient will require intermittent supervision and follow up outpatient.  OT - End of Session Activity Tolerance: Tolerates 30+ min activity with multiple rests Endurance Deficit: Yes OT Assessment Rehab Potential (ACUTE ONLY): Good OT Barriers to Discharge: Decreased caregiver support OT Patient demonstrates impairments in the following area(s): Balance;Endurance;Motor OT Basic ADL's Functional Problem(s): Bathing;Dressing;Toileting OT Advanced ADL's Functional Problem(s): Simple Meal Preparation;Light Housekeeping OT Transfers Functional Problem(s): Toilet;Tub/Shower OT Additional Impairment(s): Fuctional Use of Upper Extremity OT Plan OT Intensity: Minimum of 1-2 x/day, 45 to 90 minutes OT Frequency: 5 out of 7 days OT Duration/Estimated Length of Stay: 5-7 days OT Treatment/Interventions: Balance/vestibular training;Community reintegration;Discharge planning;Disease mangement/prevention;DME/adaptive  equipment instruction;Functional electrical stimulation;Functional mobility training;Pain management;Patient/family education;Psychosocial support;Neuromuscular re-education;Self Care/advanced ADL retraining;Skin care/wound managment;Splinting/orthotics;Therapeutic Activities;Therapeutic Exercise;UE/LE Strength taining/ROM;UE/LE Coordination activities;Visual/perceptual remediation/compensation OT Basic Self-Care Anticipated Outcome(s): Mod I OT Toileting Anticipated Outcome(s): Mod I OT Bathroom Transfers Anticipated Outcome(s): Mod I OT Recommendation Patient destination: Home Follow Up Recommendations: Outpatient OT Equipment Recommended: To be determined   OT Evaluation Precautions/Restrictions  Precautions Precautions: Fall Precaution/Restrictions Comments: Mild R-sided hemiparesis Restrictions Weight Bearing Restrictions Per Provider Order: No General Chart Reviewed: Yes Family/Caregiver Present: No Vital Signs Therapy Vitals Pulse Rate: 77 BP: 121/77 Patient Position (if appropriate): Sitting Pain Pain Assessment Pain Scale: 0-10 Pain Score: 0-No pain Home Living/Prior Functioning Home Living Family/patient expects to be discharged to:: Private residence Living Arrangements: Spouse/significant other, Children Available Help at Discharge: Family Type of Home: House Home Access: Level entry Home Layout: Multi-level, Laundry or work area in basement, Bed/bath upstairs Alternate Level Stairs-Rails: None (Rails on both sides, but unable to reach due to staircase width.) Bathroom Shower/Tub: Walk-in shower (Detachable shower head) Bathroom Toilet: Standard Bathroom Accessibility: Yes Additional Comments: Patient lives in [place] and has an outbound flight on 6/19.  Lives With: Spouse, Family IADL History Homemaking Responsibilities: Yes Meal Prep Responsibility: Primary Child Care Responsibility: Secondary Current License: Yes Type of Occupation: Principal Prior  Function Level of Independence: Independent with basic ADLs, Independent with homemaking with ambulation, Independent with gait, Independent with transfers Driving: Yes Vocation: Full time employment Vision Baseline Vision/History: 1 Wears  glasses Ability to See in Adequate Light: 0 Adequate Patient Visual Report: No change from baseline Vision Assessment?: No apparent visual deficits Perception  Perception: Within Functional Limits Praxis Praxis: WFL Cognition Cognition Overall Cognitive Status: Within Functional Limits for tasks assessed Arousal/Alertness: Awake/alert Orientation Level: Person;Place;Situation Memory: Appears intact Awareness: Appears intact Problem Solving: Appears intact Safety/Judgment: Appears intact Brief Interview for Mental Status (BIMS) Repetition of Three Words (First Attempt): 3 Temporal Orientation: Year: Correct Temporal Orientation: Month: Accurate within 5 days Temporal Orientation: Day: Correct Recall: Sock: Yes, no cue required Recall: Blue: Yes, no cue required Recall: Bed: Yes, no cue required BIMS Summary Score: 15 Sensation Sensation Light Touch: Appears Intact Coordination Gross Motor Movements are Fluid and Coordinated: No Fine Motor Movements are Fluid and Coordinated: No Coordination and Movement Description: Deficitis due to mild R-sided hemiparesis. Motor  Motor Motor: Other (comment) Motor - Skilled Clinical Observations: Deficitis due to mild R-sided hemiparesis.  Trunk/Postural Assessment  Cervical Assessment Cervical Assessment: Within Functional Limits Thoracic Assessment Thoracic Assessment: Within Functional Limits Lumbar Assessment Lumbar Assessment: Within Functional Limits Postural Control Postural Control: Deficits on evaluation Righting Reactions: Decreased/Delayed Protective Responses: Decreased/Delayed  Balance Balance Balance Assessed: Yes Static Sitting Balance Static Sitting - Balance Support:  Feet supported Static Sitting - Level of Assistance: 7: Independent Dynamic Sitting Balance Dynamic Sitting - Balance Support: During functional activity Dynamic Sitting - Level of Assistance: 6: Modified independent (Device/Increase time) Static Standing Balance Static Standing - Balance Support: During functional activity Static Standing - Level of Assistance: 5: Stand by assistance (SUP) Dynamic Standing Balance Dynamic Standing - Level of Assistance: 5: Stand by assistance (SUP-CGA) Extremity/Trunk Assessment RUE Assessment RUE Assessment: Exceptions to [facility] Active Range of Motion (AROM) Comments: WFL General Strength Comments: 3-/5 proximally RUE Body System: Neuro Brunstrum levels for arm and hand: Arm;Hand Brunstrum level for arm: Stage V Relative Independence from Synergy Brunstrum level for hand: Stage VI Isolated joint movements LUE Assessment LUE Assessment: Within Functional Limits Active Range of Motion (AROM) Comments: WFL General Strength Comments: WFL; painful weighted elbow flexion  Care Tool Care Tool Self Care Eating   Eating Assist Level: Independent with assistive device    Oral Care    Oral Care Assist Level: Independent with assistive device    Bathing   Body parts bathed by patient: Right arm;Left arm;Chest;Abdomen;Front perineal area;Right upper leg;Buttocks;Left upper leg;Right lower leg;Left lower leg;Face     Assist Level: Supervision/Verbal cueing    Upper Body Dressing(including orthotics)   What is the patient wearing?: Pull over shirt   Assist Level: Independent with assistive device    Lower Body Dressing (excluding footwear)   What is the patient wearing?: Underwear/pull up;Pants Assist for lower body dressing: Supervision/Verbal cueing    Putting on/Taking off footwear   What is the patient wearing?: Non-skid slipper socks Assist for footwear: Set up assist       Care Tool Toileting Toileting activity   Assist for toileting:  Supervision/Verbal cueing     Care Tool Bed Mobility Roll left and right activity        Sit to lying activity        Lying to sitting on side of bed activity         Care Tool Transfers Sit to stand transfer        Chair/bed transfer         Toilet transfer   Assist Level: Supervision/Verbal cueing     Care Tool Cognition  Expression of Ideas  and Wants Expression of Ideas and Wants: 4. Without difficulty (complex and basic) - expresses complex messages without difficulty and with speech that is clear and easy to understand  Understanding Verbal and Non-Verbal Content Understanding Verbal and Non-Verbal Content: 4. Understands (complex and basic) - clear comprehension without cues or repetitions   Memory/Recall Ability Memory/Recall Ability : Current season;Location of own room;Staff names and faces;That he or she is in a hospital/hospital unit   Refer to Care Plan for Long Term Goals  SHORT TERM GOAL WEEK 1 OT Short Term Goal 1 (Week 1): STGs=LTGs due to patient's estimated length of stay.  Recommendations for other services: None    Skilled Therapeutic Intervention Session began with introduction to OT role, OT POC, and general orientation to rehab unit/schedule. Pt completes full-body bathing/dressing with levels of assistance noted below, all room-level transfers at supervision level with no AD. VSS throughout session. Pt remained sitting in recliner, posey pad alarmed, and all immediate needs met.  ADL ADL Eating: Supervision/safety;Set up Where Assessed-Eating: Edge of bed Grooming: Supervision/safety Where Assessed-Grooming: Standing at sink Upper Body Bathing: Modified independent Where Assessed-Upper Body Bathing: Shower Lower Body Bathing: Supervision/safety Where Assessed-Lower Body Bathing: Shower Upper Body Dressing: Modified independent (Device) Where Assessed-Upper Body Dressing: Edge of bed Lower Body Dressing: Supervision/safety Where  Assessed-Lower Body Dressing: Edge of bed Toileting: Supervision/safety Where Assessed-Toileting: Teacher, adult education: Close supervision Statistician Method: Proofreader: Chiropractor Transfer: Unable to Engineer, technical sales: Close supervision Film/video editor Method: Designer, industrial/product: Grab bars;Shower seat without back Mobility  Transfers Sit to Stand: Supervision/Verbal cueing Stand to Sit: Supervision/Verbal cueing   Discharge Criteria: Patient will be discharged from OT if patient refuses treatment 3 consecutive times without medical reason, if treatment goals not met, if there is a change in medical status, if patient makes no progress towards goals or if patient is discharged from hospital.  The above assessment, treatment plan, treatment alternatives and goals were discussed and mutually agreed upon: by patient  Nereida Habermann, OTR/L, MSOT  09/12/2023, 10:16 AM

## 2023-09-12 NOTE — Plan of Care (Addendum)
 Per ABI team discussion patient progressing well post Ischemic stroke with right hemiparesis.  Patient on target to meet all goals and safe to discharge on Monday 09/16/23  at mod I assistance.per team no Family education needed. Team recommend OP follow up services and a  shower chair. Education ongoing for secondary risk management including HTN, HLD and DM with CMM/HH diet. Reviewed DAPT and other scheduled medications. MD/PA to call wife for post care coordination per SW. [place], Reianna Batdorf Gayo

## 2023-09-12 NOTE — Evaluation (Signed)
 Physical Therapy Assessment and Plan  Patient Details  Name: Tyler Hoffman MRN: [id number] Date of Birth: 1975/07/16  PT Diagnosis: Abnormality of gait, Hemiparesis dominant, and Muscle weakness Rehab Potential: Good ELOS: 3-5 days   Today's Date: 09/12/2023 PT Individual Time: 1105-1201 PT Individual Time Calculation (min): 56 min    Hospital Problem: Principal Problem:   Left pontine stroke [facility])   Past Medical History:  Past Medical History:  Diagnosis Date   CAD S/P DES PCI-LAD (08/2020 - in [place]) 07/19/2020   CTA 07/11/2020 (performed in [place]): In response to abnormal functional study. Coronary Calcium  Score 0.3.  LAD and circumflex have separate ostia.  Appears to be occlusion of the LAD after SP1.  LCx gives off 1 major marginal branch with no significant disease.  RCA has diffuse atheroma with mod stenosis prior to Crux.  LVEF Nl Systolic Fxn. ->  Cath = DES PCI prox LAD SubTO, FFR Neg RCA.   Essential hypertension 05/22/2016   History of rosacea    Hyperlipidemia associated with type 2 diabetes mellitus (HCC) 08/22/2006   Qualifier: Diagnosis of  By: Baird FNP, Frieda Kipper    Lacunar infarction (HCC) 05/2016   No residual infarct   Obesity (BMI 30-39.9) 10/16/2013   OSA on CPAP    Uses CPAP   Type II diabetes mellitus with complication (HCC)    [place] CVA &  CAD- PCI LAD   Past Surgical History:  Past Surgical History:  Procedure Laterality Date   CORONARY STENT INTERVENTION  08/31/2020   [place], [place]: DES PCI LAD (subtotal occlusion ~ 7 mm, downstream of SP1); - Unknowns STENT Size/ Brand..   CT CTA CORONARY W/CA SCORE W/CM &/OR WO/CM  07/11/2020   Performed in [place] in response to abnormal functional study: o Coronary Calcium  Score 0.3.  LAD and circumflex have separate ostia.  Appears to be occlusion of the LAD after SP1.  LCx gives off 1 major marginal branch with no significant disease.  RCA has diffuse atheroma with moderate stenosis prior to the crux.   LVEF normal systolic function. ->  Coronary angiography recommended.   LEFT HEART CATH AND CORONARY ANGIOGRAPHY  08/31/2020   [place], [place]): Two-vessel CAD with subtotal occlusion just distal to SP1.  Subcritical stenosis of the right coronary artery (IFR and FFR negative). => LAD PCI performed with good result.  (Right radial access)   nasoplasty  08/11/2003   TRANSTHORACIC ECHOCARDIOGRAM  06/07/2016   With bubble study for CVA evaluation: ([place]) normal LV function.  EF 66 5%.  No RWMA.  GR 1 DD.  No IAS, PFO or ASD noted.  No R-L shunt.  Normal valves.   TRANSTHORACIC ECHOCARDIOGRAM  12/2018   [place], [place]:ormal biventricular function.  EF 65%.  Normal wall motion.  No significant valvular disease.  No pleural effusion.  Normal aorta within the level of portable section.   TRANSTHORACIC ECHOCARDIOGRAM  08/2020   [place], [place]: Normal EF. evidence of hypertensive heart disease with normal biventricular function.  Normal aortic.  Normal valves.    Assessment & Plan Clinical Impression: Patient is a 48 year old male with histoyr of T2DM, OSA- CPAP, lacunar infarct, CAD s/p DES, obesity-BMI 33 who was admitted on 09/08/23 with sudden onset of RLE and RUE weakness. CTA negative for LVO with advanced intracranial atherosclerosis.  CT head negative for bleed and patient received TNK. MRI brain done revealing 9 mm acute infarct in left paramedian pons. Dr. Rosemarie felt that stroke due to small vessel  disease and recommended changing Plavix to Brilinta with DAPT X 4 week followed by Brilinta alone.    Patient had had bradycardia with HR in 30's and cardiology consulted for input as patient also reported DOE but no CP with three flights of stairs.  2D echo showed EF 60-65% with normal LVEF. No evidence of PAF and bradycardia felt to be in setting of stroke. Zebeta resumed and 2 week cardiac monitor placed on  07/01 with plans to review on outpatient basis after completion.  Patient has a  cardiologist in [place].  Therapy consulted and patient with slightly unsteady gait with decreased weight shifting on RLE. No BM in few days. He currently requiring supervision with ADLs and Supervision to mod assist with activity.  Patient lives in [place] --principal on [address] and visiting this summer. He was independent and working PTA. CIR recommended due to functional decline.   Patient currently requires supervision with mobility secondary to muscle weakness, decreased cardiorespiratoy endurance, impaired timing and sequencing and decreased coordination, and decreased standing balance, decreased postural control, hemiplegia, and decreased balance strategies.  Prior to hospitalization, patient was independent  with mobility and lived with Spouse, Family in a House home.  Home access is  Level entry.  Patient will benefit from skilled PT intervention to maximize safe functional mobility, minimize fall risk, and decrease caregiver burden for planned discharge home with intermittent assist.  Anticipate patient will benefit from follow up OP at discharge.  PT - End of Session Activity Tolerance: Tolerates 30+ min activity without fatigue Endurance Deficit: Yes Endurance Deficit Description: rest breaks with mobility tasks PT Assessment Rehab Potential (ACUTE/IP ONLY): Good PT Barriers to Discharge: None PT Patient demonstrates impairments in the following area(s): Balance;Endurance;Motor PT Transfers Functional Problem(s): Bed Mobility;Bed to Chair;Car PT Locomotion Functional Problem(s): Ambulation;Stairs PT Plan PT Intensity: Minimum of 1-2 x/day ,45 to 90 minutes PT Frequency: 5 out of 7 days PT Duration Estimated Length of Stay: 3-5 days PT Treatment/Interventions: Ambulation/gait training;Disease management/prevention;Stair training;Visual/perceptual remediation/compensation;Balance/vestibular training;Patient/family education;Therapeutic Activities;Cognitive remediation/compensation;Psychosocial  support;Therapeutic Exercise;Community reintegration;Functional mobility training;UE/LE Strength taining/ROM;Discharge planning;Neuromuscular re-education;UE/LE Coordination activities PT Transfers Anticipated Outcome(s): modI PT Locomotion Anticipated Outcome(s): modI ambulatory PT Recommendation Recommendations for Other Services: None Follow Up Recommendations: Outpatient PT Patient destination: Home Equipment Recommended: None recommended by PT   PT Evaluation Precautions/Restrictions Precautions Precautions: Fall Precaution/Restrictions Comments: Mild R-sided hemiparesis Restrictions Weight Bearing Restrictions Per Provider Order: No General   Vital SignsTherapy Vitals Pulse Rate: 77 BP: 121/77 Patient Position (if appropriate): Sitting Pain Pain Assessment Pain Scale: 0-10 Pain Score: 0-No pain Pain Interference Pain Interference Pain Effect on Sleep: 1. Rarely or not at all Pain Interference with Therapy Activities: 1. Rarely or not at all Pain Interference with Day-to-Day Activities: 1. Rarely or not at all Home Living/Prior Functioning Home Living Available Help at Discharge: Family Type of Home: House Home Access: Level entry Home Layout: Multi-level;Laundry or work area in basement;Bed/bath upstairs Alternate Level Stairs-Rails: None Bathroom Shower/Tub: Health visitor: Standard Bathroom Accessibility: Yes Additional Comments: Patient lives in [place] and has an outbound flight on 7/19, plan to DC to parents house, single level with one step to enter  Lives With: Spouse;Family Prior Function Level of Independence: Independent with basic ADLs;Independent with homemaking with ambulation;Independent with gait;Independent with transfers  Able to Take Stairs?: Yes Driving: Yes Vocation: Full time employment Vision/Perception  Vision - History Ability to See in Adequate Light: 0 Adequate Perception Perception: Within Functional  Limits Praxis Praxis: WFL  Cognition Overall Cognitive  Status: Within Functional Limits for tasks assessed Arousal/Alertness: Awake/alert Orientation Level: Oriented X4 Memory: Appears intact Awareness: Appears intact Problem Solving: Appears intact Safety/Judgment: Appears intact Sensation Sensation Light Touch: Appears Intact Hot/Cold: Appears Intact Proprioception: Appears Intact Coordination Gross Motor Movements are Fluid and Coordinated: No Fine Motor Movements are Fluid and Coordinated: No Coordination and Movement Description: Deficitis due to mild R-sided hemiparesis. Motor  Motor Motor: Hemiplegia Motor - Skilled Clinical Observations: Deficitis due to mild R-sided hemiparesis.  Trunk/Postural Assessment  Cervical Assessment Cervical Assessment: Within Functional Limits Thoracic Assessment Thoracic Assessment: Within Functional Limits Lumbar Assessment Lumbar Assessment: Within Functional Limits Postural Control Postural Control: Deficits on evaluation Righting Reactions: Decreased/Delayed Protective Responses: Decreased/Delayed  Balance Balance Balance Assessed: Yes Standardized Balance Assessment Standardized Balance Assessment: Berg Balance Test Berg Balance Test Sit to Stand: Able to stand without using hands and stabilize independently Standing Unsupported: Able to stand safely 2 minutes Sitting with Back Unsupported but Feet Supported on Floor or Stool: Able to sit safely and securely 2 minutes Stand to Sit: Sits safely with minimal use of hands Transfers: Able to transfer safely, minor use of hands Standing Unsupported with Eyes Closed: Able to stand 10 seconds safely Standing Ubsupported with Feet Together: Able to place feet together independently and stand 1 minute safely From Standing, Reach Forward with Outstretched Arm: Can reach confidently >25 cm (10) From Standing Position, Pick up Object from Floor: Able to pick up shoe safely and  easily From Standing Position, Turn to Look Behind Over each Shoulder: Looks behind from both sides and weight shifts well Turn 360 Degrees: Able to turn 360 degrees safely one side only in 4 seconds or less Standing Unsupported, Alternately Place Feet on Step/Stool: Able to stand independently and safely and complete 8 steps in 20 seconds Standing Unsupported, One Foot in Front: Able to place foot tandem independently and hold 30 seconds Standing on One Leg: Able to lift leg independently and hold 5-10 seconds Total Score: 54 Static Sitting Balance Static Sitting - Balance Support: Feet supported Static Sitting - Level of Assistance: 7: Independent Dynamic Sitting Balance Dynamic Sitting - Balance Support: Feet supported Dynamic Sitting - Level of Assistance: 6: Modified independent (Device/Increase time) Static Standing Balance Static Standing - Balance Support: During functional activity Static Standing - Level of Assistance: 5: Stand by assistance Dynamic Standing Balance Dynamic Standing - Balance Support: During functional activity Dynamic Standing - Level of Assistance: 5: Stand by assistance Extremity Assessment  RUE Assessment RUE Assessment: Exceptions to [facility] Active Range of Motion (AROM) Comments: WFL General Strength Comments: 3-/5 proximally RUE Body System: Neuro Brunstrum levels for arm and hand: Arm;Hand Brunstrum level for arm: Stage V Relative Independence from Synergy Brunstrum level for hand: Stage VI Isolated joint movements LUE Assessment LUE Assessment: Within Functional Limits Active Range of Motion (AROM) Comments: WFL General Strength Comments: WFL; painful weighted elbow flexion RLE Assessment RLE Assessment: Exceptions to [facility] General Strength Comments: grossly 4/5 LLE Assessment LLE Assessment: Within Functional Limits General Strength Comments: grossly 5/5  Care Tool Care Tool Bed Mobility Roll left and right activity   Roll left and right assist  level: Independent    Sit to lying activity   Sit to lying assist level: Independent    Lying to sitting on side of bed activity   Lying to sitting on side of bed assist level: the ability to move from lying on the back to sitting on the side of the bed with no back support.: Supervision/Verbal  cueing     Care Tool Transfers Sit to stand transfer   Sit to stand assist level: Supervision/Verbal cueing    Chair/bed transfer   Chair/bed transfer assist level: Supervision/Verbal cueing    Car transfer   Car transfer assist level: Supervision/Verbal cueing      Care Tool Locomotion Ambulation   Assist level: Supervision/Verbal cueing Assistive device: No Device Max distance: 350'  Walk 10 feet activity   Assist level: Supervision/Verbal cueing Assistive device: No Device   Walk 50 feet with 2 turns activity   Assist level: Supervision/Verbal cueing Assistive device: No Device  Walk 150 feet activity   Assist level: Supervision/Verbal cueing Assistive device: No Device  Walk 10 feet on uneven surfaces activity   Assist level: Supervision/Verbal cueing    Stairs   Assist level: Contact Guard/Touching assist Stairs assistive device: 1 hand rail Max number of stairs: 12  Walk up/down 1 step activity   Walk up/down 1 step (curb) assist level: Contact Guard/Touching assist Walk up/down 1 step or curb assistive device: 1 hand rail  Walk up/down 4 steps activity   Walk up/down 4 steps assist level: Contact Guard/Touching assist Walk up/down 4 steps assistive device: 1 hand rail  Walk up/down 12 steps activity   Walk up/down 12 steps assist level: Contact Guard/Touching assist Walk up/down 12 steps assistive device: 1 hand rail  Pick up small objects from floor   Pick up small object from the floor assist level: Supervision/Verbal cueing Pick up small object from the floor assistive device: n/a  Wheelchair Is the patient using a wheelchair?: No (ambulates on unit)           Wheel 50 feet with 2 turns activity      Wheel 150 feet activity        Refer to Care Plan for Long Term Goals  SHORT TERM GOAL WEEK 1 PT Short Term Goal 1 (Week 1): STG = LTG due to ELOS  Recommendations for other services: None   Skilled Therapeutic Intervention Evaluation completed (see details above and below) with education on PT POC and goals and individual treatment initiated with focus on gait training, therapeutic activities for education on DC planning, and NMR for assessment of falls risk. Pt denies pain. Pt completes transfers, car transfer, and ambulating up/down ramp and across mulch with supervision. Pt ambulates up/down 12 steps with LHR with CGA/close supervision with decreased eccentric control with descending leading with LLE but demonstrating no LOB or buckling. Pt completes gait without device on unit throughout session up to 350', pt demonstrating decreased stance time RLE and decreased arm swing, able to correct when cued but fatigues quickly. Pt completes BERG 54/56, educated on fall risk as well as progress from previous assessment with pt verbalizing understanding. Pt educated on DC planning for follow up services and expected prognosis with pt verbalizing understanding. Pt then completes gait training back to room with cues for increasing arm swing and increasing gait speed. Pt returns to room and remains seated in recliner with all needs within reach, cal light in place and declines chair alarm at end of session.    Mobility Bed Mobility Bed Mobility: Supine to Sit;Sit to Supine Supine to Sit: Independent Sit to Supine: Independent Transfers Transfers: Sit to Stand;Stand to Sit Sit to Stand: Supervision/Verbal cueing Stand to Sit: Supervision/Verbal cueing Transfer (Assistive device): None Locomotion  Gait Ambulation: Yes Gait Assistance: Supervision/Verbal cueing Gait Distance (Feet): 350 Feet Assistive device: None Gait Assistance Details: Verbal cues  for gait pattern Gait Gait: Yes Gait Pattern: Impaired Gait Pattern: Decreased dorsiflexion - right;Decreased stance time - right Gait velocity: decreased Stairs / Additional Locomotion Stairs: Yes Stairs Assistance: Contact Guard/Touching assist Stair Management Technique: One rail Left Number of Stairs: 12 Height of Stairs: 6 Ramp: Supervision/Verbal cueing Curb: Engineer, manufacturing Wheelchair Mobility: No   Discharge Criteria: Patient will be discharged from PT if patient refuses treatment 3 consecutive times without medical reason, if treatment goals not met, if there is a change in medical status, if patient makes no progress towards goals or if patient is discharged from hospital.  The above assessment, treatment plan, treatment alternatives and goals were discussed and mutually agreed upon: by patient  Reche Ohara PT, DPT 09/12/2023, 12:24 PM

## 2023-09-12 NOTE — Progress Notes (Signed)
 Nutrition Brief Note  Received consult for diet education. Unable to reach patient by phone at this time. Heart Healthy, Consistent Carbohydrate Nutrition Therapy handout provided in discharge instructions. RD to follow up at a later date/time to provide diet education as able.   Suzen HUNT RD, LDN, CNSC Contact via secure chat. If unavailable, use group chat RD Inpatient.

## 2023-09-12 NOTE — Progress Notes (Signed)
 Patient stated that his last bowel movement was 7 days ago. He was given senna on the previous shift. On my second round, he stated that he was ready to try something else & was given sorbitol. No other complaints. Will continue to monitor.

## 2023-09-12 NOTE — Plan of Care (Signed)
 Progressing toward goals.

## 2023-09-13 DIAGNOSIS — I10 Essential (primary) hypertension: Secondary | ICD-10-CM | POA: Diagnosis not present

## 2023-09-13 DIAGNOSIS — K5900 Constipation, unspecified: Secondary | ICD-10-CM | POA: Diagnosis not present

## 2023-09-13 DIAGNOSIS — I639 Cerebral infarction, unspecified: Secondary | ICD-10-CM | POA: Diagnosis not present

## 2023-09-13 DIAGNOSIS — E1169 Type 2 diabetes mellitus with other specified complication: Secondary | ICD-10-CM | POA: Diagnosis not present

## 2023-09-13 LAB — BASIC METABOLIC PANEL WITH GFR
Anion gap: 8 (ref 5–15)
BUN: 23 mg/dL — ABNORMAL HIGH (ref 6–20)
CO2: 25 mmol/L (ref 22–32)
Calcium: 8.8 mg/dL — ABNORMAL LOW (ref 8.9–10.3)
Chloride: 104 mmol/L (ref 98–111)
Creatinine, Ser: 1.08 mg/dL (ref 0.61–1.24)
GFR, Estimated: >60 mL/min (ref >60)
Glucose, Bld: 130 mg/dL — ABNORMAL HIGH (ref 70–99)
Potassium: 4.2 mmol/L (ref 3.5–5.1)
Sodium: 137 mmol/L (ref 135–145)

## 2023-09-13 LAB — GLUCOSE, CAPILLARY
Glucose-Capillary: 130 mg/dL — ABNORMAL HIGH (ref 70–99)
Glucose-Capillary: 150 mg/dL — ABNORMAL HIGH (ref 70–99)
Glucose-Capillary: 138 mg/dL — ABNORMAL HIGH (ref 70–99)
Glucose-Capillary: 191 mg/dL — ABNORMAL HIGH (ref 70–99)

## 2023-09-13 NOTE — Progress Notes (Signed)
 Occupational Therapy Discharge Summary  Patient Details  Name: Raymundo Rout MRN: [id number] Date of Birth: 07-25-1975  Date of Discharge from OT service:September 15, 2023   Patient has met 9 of 9 long term goals due to improved activity tolerance, improved balance, ability to compensate for deficits, and functional use of  LEFT upper and LEFT lower extremity.  Patient to discharge at overall Modified Independent level.  Patient's care partner is independent to provide the necessary physical assistance at discharge.    Reasons goals not met: NA  Recommendation:  Patient will benefit from ongoing skilled OT services in outpatient setting to continue to advance functional skills in the area of BADL, iADL, Vocation, and Reduce care partner burden.  Equipment: No equipment provided  Reasons for discharge: treatment goals met and discharge from hospital  Patient/family agrees with progress made and goals achieved: Yes  OT Discharge Precautions/Restrictions  Precautions Precautions: Fall Precaution/Restrictions Comments: Mild R-sided hemiparesis Restrictions Weight Bearing Restrictions Per Provider Order: No ADL ADL Eating: Modified independent Where Assessed-Eating: Edge of bed Grooming: Modified independent Where Assessed-Grooming: Standing at sink Upper Body Bathing: Modified independent Where Assessed-Upper Body Bathing: Shower Lower Body Bathing: Modified independent Where Assessed-Lower Body Bathing: Shower Upper Body Dressing: Modified independent (Device) Where Assessed-Upper Body Dressing: Edge of bed Lower Body Dressing: Modified independent Where Assessed-Lower Body Dressing: Edge of bed Toileting: Modified independent Where Assessed-Toileting: Teacher, adult education: Engineer, agricultural Method: Proofreader: Engineer, technical sales: Unable to Engineer, technical sales: Modified independent Film/video editor  Method: Designer, industrial/product: Grab bars, Shower seat without back Vision Baseline Vision/History: 1 Wears glasses Patient Visual Report: No change from baseline Vision Assessment?: No apparent visual deficits Perception  Perception: Within Functional Limits Praxis Praxis: WFL Cognition Cognition Overall Cognitive Status: Within Functional Limits for tasks assessed Arousal/Alertness: Awake/alert Orientation Level: Person;Place;Situation Person: Oriented Place: Oriented Situation: Oriented Memory: Appears intact Awareness: Appears intact Problem Solving: Appears intact Safety/Judgment: Appears intact Brief Interview for Mental Status (BIMS) Repetition of Three Words (First Attempt): 3 Temporal Orientation: Year: Correct Temporal Orientation: Month: Accurate within 5 days Temporal Orientation: Day: Correct Recall: Sock: Yes, no cue required Recall: Blue: Yes, no cue required Recall: Bed: Yes, no cue required BIMS Summary Score: 15 Sensation Sensation Light Touch: Appears Intact Hot/Cold: Appears Intact Proprioception: Appears Intact Coordination Gross Motor Movements are Fluid and Coordinated: No Fine Motor Movements are Fluid and Coordinated: No Coordination and Movement Description: Deficitis due to mild R-sided hemiparesis. Motor  Motor Motor: Hemiplegia Motor - Skilled Clinical Observations: Deficitis due to mild R-sided hemiparesis. Mobility  Transfers Sit to Stand: Independent Stand to Sit: Independent  Trunk/Postural Assessment  Cervical Assessment Cervical Assessment: Within Functional Limits Thoracic Assessment Thoracic Assessment: Within Functional Limits Lumbar Assessment Lumbar Assessment: Within Functional Limits Postural Control Postural Control: Deficits on evaluation Righting Reactions: Decreased/Delayed Protective Responses: Decreased/Delayed  Balance Balance Balance Assessed: Yes Static Sitting Balance Static Sitting -  Balance Support: Feet supported Static Sitting - Level of Assistance: 7: Independent Dynamic Sitting Balance Dynamic Sitting - Balance Support: Feet supported Dynamic Sitting - Level of Assistance: 6: Modified independent (Device/Increase time) Static Standing Balance Static Standing - Balance Support: During functional activity Static Standing - Level of Assistance: 7: Independent Dynamic Standing Balance Dynamic Standing - Balance Support: During functional activity Dynamic Standing - Level of Assistance: 6: Modified independent (Device/Increase time) Extremity/Trunk Assessment RUE Assessment RUE Assessment: Exceptions to [facility] Active Range of Motion (AROM) Comments: WFL General Strength Comments:  3-/5 proximally LUE Assessment LUE Assessment: Within Functional Limits Active Range of Motion (AROM) Comments: WFL General Strength Comments: WFL; painful weighted elbow flexion   Nereida Habermann, OTR/L, MSOT Camie Hoe, OTD, OTR/L 09/15/2023, 4:30 PM

## 2023-09-13 NOTE — Care Management (Signed)
 [facility] Individual Statement of Services  Patient Name:  Tyler Hoffman  Date:  09/13/2023  Welcome to the Inpatient [facility].  Our goal is to provide you with an individualized program based on your diagnosis and situation, designed to meet your specific needs.  With this comprehensive rehabilitation program, you will be expected to participate in at least 3 hours of rehabilitation therapies Monday-Friday, with modified therapy programming on the weekends.  Your rehabilitation program will include the following services:  Physical Therapy (PT), Occupational Therapy (OT), 24 hour per day rehabilitation nursing, Therapeutic Recreaction (TR), Psychology, Neuropsychology, Care Coordinator, Rehabilitation Medicine, Nutrition Services, Pharmacy Services, and Other  Weekly team conferences will be held on Tuesdays to discuss your progress.  Your Inpatient Rehabilitation Care Coordinator will talk with you frequently to get your input and to update you on team discussions.  Team conferences with you and your family in attendance may also be held.  Expected length of stay: 3-7 days    Overall anticipated outcome: Independent  Depending on your progress and recovery, your program may change. Your Inpatient Rehabilitation Care Coordinator will coordinate services and will keep you informed of any changes. Your Inpatient Rehabilitation Care Coordinator's name and contact numbers are listed  below.  The following services may also be recommended but are not provided by the Inpatient Rehabilitation Center:  Driving Evaluations Home Health Rehabiltiation Services Outpatient Rehabilitation Services Vocational Rehabilitation   Arrangements will be made to provide these services after discharge if needed.  Arrangements include referral to agencies that provide these services.  Your insurance has been verified to be:  Community education officer  Your primary doctor is:  Greig Ring  Pertinent  information will be shared with your doctor and your insurance company.  Inpatient Rehabilitation Care Coordinator:  Graeme Feliciana SILK [phone] or ([phone]  Information discussed with and copy given to patient by: Graeme DELENA Feliciana, 09/13/2023, 9:27 AM

## 2023-09-13 NOTE — Progress Notes (Addendum)
 Patient ID: Tyler Hoffman, male   DOB: [birth year], 48 y.o.   MRN: [id number]  SW received updates from medical team confirming pt can discharge on Monday.   1102- SW spoke with [name] to inform on d/c date being Monday. SW informed wife a referral can be sent for outpatient PT/OT at [facility] but no guarantees he will be seen with their return to [place] being 7/19. SW will reach out to dietitian and ask for follow-up again since wife has questions about diet. She will have his aunt pick him up on Monday during discharge time.   SW faxed outpatient PT/OT referral to [facility].   Tyler Hoffman, MSW, LCSW Office: [phone] Cell: [phone] Fax: [phone]

## 2023-09-13 NOTE — Plan of Care (Signed)
 Per ABI team; patient is doing well overall post pontine CVA with right hemiparesis and will meet mod I goals for discharge by Monday 09/16/2023. History of lacunar CVA, HTN, and DM.  Ongoing education on medications and dietary modification recommendations.  OP follow up services recommended and DME: shower chair. Barrier to d/c identified as family plans to return to [place] September 28, 2023; will not yet be scheduled for OP follow up in [place] . Tyler Hoffman

## 2023-09-13 NOTE — Progress Notes (Signed)
 Nutrition Education Note  RD consulted for nutrition education regarding heart healthy carbohydrate modified diet. Spoke with patient over the phone and answered his questions. RD added Heart Healthy, Consistent Carbohydrate Nutrition Therapy handout from the Academy of [employer]etics to discharge instructions. Patient has a good understanding of the diet, but would like some printed education materials with more examples of meals and snacks. Nurse to print out the information so patient can review it prior to discharge. RD to follow up as able prior to discharge to answer any further questions.   Topics reviewed: Discouraged intake of processed foods and use of salt shaker.  Encouraged fresh fruits and vegetables as well as whole grain sources of carbohydrates to maximize fiber intake.  Discussed different food groups and their effects on blood sugar, emphasizing carbohydrate-containing foods.  Discussed importance of controlled and consistent carbohydrate intake throughout the day.  Encouraged intake of high-fiber, whole grain complex carbohydrates.   Teach back method used. Expect good compliance.  Body mass index is 32.77 kg/m. Pt meets criteria for obesity based on current BMI.  Current diet order is heart healthy carbohydrate modified, patient is consuming 75-100% of meals at this time. Labs and medications reviewed. No further nutrition interventions warranted at this time. If additional nutrition issues arise, please re-consult RD.   Suzen HUNT RD, LDN, CNSC Contact via secure chat. If unavailable, use group chat RD Inpatient.

## 2023-09-13 NOTE — Plan of Care (Signed)
  Problem: RH PAIN MANAGEMENT Goal: RH STG PAIN MANAGED AT OR BELOW PT'S PAIN GOAL Description: <4 w/ prns Outcome: Progressing   Problem: RH SAFETY Goal: RH STG ADHERE TO SAFETY PRECAUTIONS W/ASSISTANCE/DEVICE Description: STG Adhere to Safety Precautions With mod I Assistance/Device. Outcome: Progressing   Problem: Education: Goal: Ability to describe self-care measures that may prevent or decrease complications (Diabetes Survival Skills Education) will improve Outcome: Progressing

## 2023-09-13 NOTE — Progress Notes (Signed)
 Occupational Therapy Session Note  Patient Details  Name: Tyler Hoffman MRN: [id number] Date of Birth: 1975/07/27  Today's Date: 09/13/2023 OT Individual Time: 1105-1200 OT Individual Time Calculation (min): 55 min   Today's Date: 09/13/2023 OT Individual Time: 1305-1400 OT Individual Time Calculation (min): 55 min   Short Term Goals: Week 1:  OT Short Term Goal 1 (Week 1): STGs=LTGs due to patient's estimated length of stay.  Skilled Therapeutic Interventions/Progress Updates:   Session 1: Pt greeted resting in bed for skilled OT session with focus on functional mobility at community level and RUE NMR.   Pain: Pt with no reports of pain. OT offering intermediate rest breaks and positioning suggestions throughout session to address pain/fatigue and maximize participation/safety in session.   Functional Transfers: All transfers completed at supervision-level.   Self Care Tasks: 3/3 toileting tasks with distant supervision. Pt able to manage 2x3 flights of stairs with supervision + unilateral RUE support on rail for translation into home stair management.   Therapeutic Activities: Pt engages in ~15 mins of playing piano, utilizing RUE at diminished level for speed/accuracy movements to complete task. Pt reports improvement in movements wit repetition.   In outdoor setting, pt able to reach towards floor to retrieve x5 items, cuing for reaching mechanics, supervision level.   Pt remained resting in bed with 4Ps assessed and immediate needs met. Pt continues to be appropriate for skilled OT intervention to promote further functional independence in ADLs/IADLs.   Session 2: Pt greeted resting in bed for skilled OT session with focus on BADL participation, functional mobility .   Pain: Pt with no reports of pain. OT offering intermediate rest breaks and positioning suggestions throughout session to address pain/fatigue and maximize participation/safety in session.   Functional Transfers: All  transfers at distant supervision + no AD.  Self Care Tasks: Full-body bathing within shower context WITHOUT use of shower seat. Completed at distant supervision level, standing sink-side grooming in similar fashion.   Therapeutic Activities: Pt engage in the following activities to address balance strategies and postural control, and management of fall risk, detail below: 1x12 reps of walking across foam beam, initially CGA progressing to supervision ~100 ft of mobility with OT pulling on resistance band around patient waist, supervision with improved reaction times to manage balance perturbations.   Of note, patient made Mod I in room, communicated this to nursing staff.   Pt remained resting in bed with 4Ps assessed and immediate needs met. Pt continues to be appropriate for skilled OT intervention to promote further functional independence in ADLs/IADLs.   Therapy Documentation Precautions:  Precautions Precautions: Fall Precaution/Restrictions Comments: Mild R-sided hemiparesis Restrictions Weight Bearing Restrictions Per Provider Order: No   Therapy/Group: Individual Therapy  Nereida Habermann, OTR/L, MSOT  09/13/2023, 6:12 AM

## 2023-09-13 NOTE — Progress Notes (Signed)
 Physical Therapy Session Note  Patient Details  Name: Tyler Hoffman MRN: [id number] Date of Birth: [birth year]  Today's Date: 09/13/2023 PT Individual Time: 0808-0918 PT Individual Time Calculation (min): 70 min   Short Term Goals: Week 1:  PT Short Term Goal 1 (Week 1): STG = LTG due to ELOS  Skilled Therapeutic Interventions/Progress Updates:    Pt presents in room in bed, motivated to participate with PT. Pt denies pain. Session focused on NMR for BUE/BLE coordination, dual tasking, dynamic standing balance as well as therapeutic activity for tolerance to upright and managing energy conservation. Pt completes bed mobility and all transfers modI without device, requires increased time to complete. Pt ambulates without device from room to main gym with supervision. Pt then completes NMR with self ball toss to promtoe BUE/BLE coordination, dual tasking, dynamic standing balance including: - sitting ball toss (two hand catch and toss between hands) - standing ball toss (two hand catch and toss between hands) - marching ball toss ((two hand catch and toss between hands) - cues for decreasing speed of march to increase single limb stance time - walking 200' (two hand catch and toss between hands) - navigating 5 cones forward x2 trials and backwards x2 trials (two hand catch and toss between hands) Pt completes then ambulates from main gym to elevators with distant supervision, takes elevator to second floor then navigates the two flights of stairs with RHR supervision to first floor to atrium >500', reciprocal gait for descent. Pt then completes gait training outside over uneven terrain including sidewalks, community stairs, curbs, crosswalks, and ambulating up/down hill over grass and mulch, provided with cues and education throughout gait for safety negotiating environment with pt providing context for home environment in [place]. Pt provided with seated rest breaks between all gait trials and exercises to  promote energy conservation and quality with tasks. Pt then ambulates back to atrium and comes to sitting at piano. Pt practices playing scales on piano as fine motor BUE NMR with pt demonstrating difficulty with coordinating RUE. Therapist provides sheet music at end of session for following sessions to continue practice. Pt then completes navigating stairs to 4th floor with RHR and supervision, one cue for managing full foot on step during task. Pt returns to room and remains seated in bed with all needs within reach, cal light in place at end of session.    Therapy Documentation Precautions:  Precautions Precautions: Fall Precaution/Restrictions Comments: Mild R-sided hemiparesis Restrictions Weight Bearing Restrictions Per Provider Order: No    Therapy/Group: Individual Therapy  Reche Ohara PT, DPT 09/13/2023, 12:52 PM

## 2023-09-13 NOTE — Progress Notes (Signed)
 Physical Therapy Discharge Summary  Patient Details  Name: Tyler Hoffman MRN: [id number] Date of Birth: 1976-01-04  Date of Discharge from PT service:September 15, 2023  {CHL IP REHAB PT TIME CALCULATION:[id number]}   Patient has met 7 of 7 long term goals due to improved activity tolerance, improved balance, improved postural control, increased strength, functional use of  right upper extremity and right lower extremity, and improved coordination.  Patient to discharge at an ambulatory level Modified Independent.   Patient's care partner is independent to provide the necessary physical assistance at discharge.  Reasons goals not met: N/A  Recommendation:  Patient will benefit from ongoing skilled PT services in outpatient setting to continue to advance safe functional mobility, address ongoing impairments in high level gait, BUE/BLE coordination, and minimize fall risk.  Equipment: No equipment provided  Reasons for discharge: treatment goals met and discharge from hospital  Patient/family agrees with progress made and goals achieved: Yes  PT Discharge Precautions/Restrictions Precautions Precautions: Fall Precaution/Restrictions Comments: Mild R-sided hemiparesis Restrictions Weight Bearing Restrictions Per Provider Order: No Pain Interference   Vision/Perception  Vision - History Ability to See in Adequate Light: 0 Adequate Perception Perception: Within Functional Limits Praxis Praxis: WFL  Cognition Overall Cognitive Status: Within Functional Limits for tasks assessed Arousal/Alertness: Awake/alert Memory: Appears intact Awareness: Appears intact Problem Solving: Appears intact Safety/Judgment: Appears intact Sensation Sensation Light Touch: Appears Intact Hot/Cold: Appears Intact Proprioception: Appears Intact Coordination Gross Motor Movements are Fluid and Coordinated: No Fine Motor Movements are Fluid and Coordinated: No Coordination and Movement Description:  Deficitis due to mild R-sided hemiparesis. Motor  Motor Motor: Hemiplegia Motor - Skilled Clinical Observations: Deficitis due to mild R-sided hemiparesis.  Mobility Transfers Transfers: Sit to Stand;Stand to Sit Sit to Stand: Independent Stand to Sit: Independent Transfer (Assistive device): None Locomotion     Trunk/Postural Assessment  Cervical Assessment Cervical Assessment: Within Functional Limits Thoracic Assessment Thoracic Assessment: Within Functional Limits Lumbar Assessment Lumbar Assessment: Within Functional Limits Postural Control Postural Control: Deficits on evaluation Righting Reactions: Decreased/Delayed Protective Responses: Decreased/Delayed  Balance Balance Balance Assessed: Yes Static Sitting Balance Static Sitting - Balance Support: Feet supported Static Sitting - Level of Assistance: 7: Independent Dynamic Sitting Balance Dynamic Sitting - Balance Support: Feet supported Dynamic Sitting - Level of Assistance: 6: Modified independent (Device/Increase time) Static Standing Balance Static Standing - Balance Support: During functional activity Static Standing - Level of Assistance: 7: Independent Dynamic Standing Balance Dynamic Standing - Balance Support: During functional activity Dynamic Standing - Level of Assistance: 6: Modified independent (Device/Increase time) Extremity Assessment  RUE Assessment RUE Assessment: Exceptions to [facility] Active Range of Motion (AROM) Comments: WFL General Strength Comments: 3-/5 proximally LUE Assessment LUE Assessment: Within Functional Limits Active Range of Motion (AROM) Comments: WFL General Strength Comments: WFL; painful weighted elbow flexion RLE Assessment RLE Assessment: Within Functional Limits LLE Assessment LLE Assessment: Within Functional Limits   Reche Ohara 09/13/2023, 1:06 PM

## 2023-09-13 NOTE — Progress Notes (Addendum)
 PROGRESS NOTE   Subjective/Complaints:  No acute events overnight.  Denies any significant pain.  LBM yesterday.  Patient asks about being on Lovenox  in addition to aspirin /Brilinta .  Discussed with Lovenox  provides better reduction of DVT risk.  ROS: Patient denies fever, chills, new vision changes, dizziness, nausea, vomiting, diarrhea,  constipation, shortness of breath or chest pain, headache, or mood change.  Objective:   No results found. Recent Labs    09/12/23 0514  WBC 7.7  HGB 16.8  HCT 48.3  PLT 225   Recent Labs    09/12/23 0514 09/13/23 0436  NA 134* 137  K 3.5 4.2  CL 102 104  CO2 21* 25  GLUCOSE 132* 130*  BUN 24* 23*  CREATININE 0.98 1.08  CALCIUM  8.9 8.8*    Intake/Output Summary (Last 24 hours) at 09/13/2023 1048 Last data filed at 09/12/2023 1900 Gross per 24 hour  Intake 340 ml  Output --  Net 340 ml        Physical Exam: Vital Signs Blood pressure 117/77, pulse (!) 58, temperature 97.8 F (36.6 C), temperature source Oral, resp. rate 18, height 5' 3 (1.6 m), weight 83.9 kg, SpO2 98%.  General: No apparent distress, sitting in WC HEENT: [place],AT, MMM Neck: Supple without JVD or lymphadenopathy Heart: Reg rate and rhythm. No murmurs rubs or gallops, cardiac monitor L chest Chest: CTA bilaterally without wheezes, rales, or rhonchi; no distress Abdomen: Soft, non-tender, non-distended, bowel sounds positive. Extremities: No clubbing, cyanosis, or edema. Pulses are 2+ Psych: Pt's affect is appropriate. Pt is cooperative. Pleasant  Skin: Clean and intact without signs of breakdown Neuro: Alert and oriented x 3, good insight and awareness, fluent, follows commands, mild right facial weakness    MOTOR: RUE: 4/5 Deltoid, 4+/5 Biceps, 4+/5 Triceps,4/5 Grip LUE: 5/5 Deltoid, 5/5 Biceps, 5/5 Triceps, 5/5 Grip RLE: HF 4/5, KE 4+/5, ADF 4+/5, APF 4+/5 LLE: HF 5/5, KE 5/5, ADF 5/5, APF 5/5    SENSORY: Normal to touch all 4 extremities   No hypertonia noted         Assessment/Plan: 1. Functional deficits which require 3+ hours per day of interdisciplinary therapy in a comprehensive inpatient rehab setting. Physiatrist is providing close team supervision and 24 hour management of active medical problems listed below. Physiatrist and rehab team continue to assess barriers to discharge/monitor patient progress toward functional and medical goals  Care Tool:  Bathing    Body parts bathed by patient: Right arm, Left arm, Chest, Abdomen, Front perineal area, Right upper leg, Buttocks, Left upper leg, Right lower leg, Left lower leg, Face         Bathing assist Assist Level: Supervision/Verbal cueing     Upper Body Dressing/Undressing Upper body dressing   What is the patient wearing?: Pull over shirt    Upper body assist Assist Level: Independent with assistive device    Lower Body Dressing/Undressing Lower body dressing      What is the patient wearing?: Underwear/pull up, Pants     Lower body assist Assist for lower body dressing: Supervision/Verbal cueing     Toileting Toileting    Toileting assist Assist for toileting: Supervision/Verbal cueing  Transfers Chair/bed transfer  Transfers assist     Chair/bed transfer assist level: Supervision/Verbal cueing     Locomotion Ambulation   Ambulation assist      Assist level: Supervision/Verbal cueing Assistive device: No Device Max distance: 350'   Walk 10 feet activity   Assist     Assist level: Supervision/Verbal cueing Assistive device: No Device   Walk 50 feet activity   Assist    Assist level: Supervision/Verbal cueing Assistive device: No Device    Walk 150 feet activity   Assist    Assist level: Supervision/Verbal cueing Assistive device: No Device    Walk 10 feet on uneven surface  activity   Assist     Assist level: Supervision/Verbal cueing      Wheelchair     Assist Is the patient using a wheelchair?: No (ambulates on unit)             Wheelchair 50 feet with 2 turns activity    Assist            Wheelchair 150 feet activity     Assist          Blood pressure 117/77, pulse (!) 58, temperature 97.8 F (36.6 C), temperature source Oral, resp. rate 18, height 5' 3 (1.6 m), weight 83.9 kg, SpO2 98%.  Medical Problem List and Plan: 1. Functional deficits secondary to left paramedian pontine CVA s/p TNK             -patient may  shower             -ELOS/Goals: PT, OT modified to independent, 4 to 6 days             - Continue CIR  - DC planned for monday 2.  Antithrombotics: -DVT/anticoagulation:  Pharmaceutical: Lovenox  added- pt requested DC- ok to DC due to distance he is walking             -antiplatelet therapy: ASA/Brilinta  X 4 weeks followed by Brilinta  alone.  3. Pain Management: Tylenol  prn.  4. Mood/Behavior/Sleep: LCSW to follow for evaluation and support             -antipsychotic agents: N/A 5. Neuropsych/cognition: This patient is capable of making decisions on his own behalf. 6. Skin/Wound Care: Routine pressure relief measures.  7. Fluids/Electrolytes/Nutrition: Monitor I/O. Check CMET in am 8.  HTN: Monitor BP TID--was on Bisoprolol  and Lisinopril .  BP goal less than 180/105 --Continues on low dose Bisoprolol . Has had HR improving.  -7/4 BP stable, continue to monitor     09/13/2023    5:19 AM 09/12/2023    8:41 PM 09/12/2023    3:57 PM  Vitals with BMI  Systolic 117 116 879  Diastolic 77 77 70  Pulse 58 58 70    9. T2DM: Hgb A1c-8.1. Was on tresiba  60 units, trulicity  and jardiance  at home--compliance?             --BS relatively ranging from 100-160. Will resume insulin  glargine at 10 units HS             --encourage fluid intake as on Jardiance  and BUN trending up.  CBG (last 3)  Recent Labs    09/12/23 1854 09/12/23 2042 09/13/23 0623  GLUCAP 283* 199* 130*  Had  elevated CBGs last night but overall controlled, monitor trend    10. Pre-renal azotemia: BUN- 22. Encourage fluid intake.  Recheck labs -7/3 BUNs a little higher at 24, encourage oral fluids and monitor  for now, asked nursing to enourage 11. OSA: Has been compliant with CPAP use.  Discussed benefits of CPAP 12. Dyslipidemia: Trig-155/LDL-98.  Statin 13. Obesity class I: Educated on diet and exercise to promote health and weight loss.  Dietitian to see patient             Body mass index is 33.16 kg/m. 14. CAD DES to LAD 07/11/20.  Continue ASA/Brilinta  as above 15. Constipation: PRN senokot, PRN sorbitol   - 7/3 improved after bowel movement today  -7/4 LBM today, pt to try to eat foods that help promote Bms, continue PRN laxatives 16.  Elevated bilirubin: Recheck labs Monday      LOS: 2 days A FACE TO FACE EVALUATION WAS PERFORMED  Murray Collier 09/13/2023, 10:48 AM

## 2023-09-14 DIAGNOSIS — I639 Cerebral infarction, unspecified: Secondary | ICD-10-CM | POA: Diagnosis not present

## 2023-09-14 LAB — GLUCOSE, CAPILLARY
Glucose-Capillary: 116 mg/dL — ABNORMAL HIGH (ref 70–99)
Glucose-Capillary: 131 mg/dL — ABNORMAL HIGH (ref 70–99)
Glucose-Capillary: 128 mg/dL — ABNORMAL HIGH (ref 70–99)
Glucose-Capillary: 127 mg/dL — ABNORMAL HIGH (ref 70–99)

## 2023-09-14 NOTE — Progress Notes (Signed)
 Physical Therapy Session Note  Patient Details  Name: Kel Senn MRN: [id number] Date of Birth: 02/15/1976  Today's Date: 09/14/2023 PT Individual Time: 0916-1015 PT Individual Time Calculation (min): 59 min   Short Term Goals: Week 1:  PT Short Term Goal 1 (Week 1): STG = LTG due to ELOS  Skilled Therapeutic Interventions/Progress Updates: Pt presents semi-reclined in bed and agreeable to therapy.  Pt transfers w/ independence and then sit to stand w/ same.  Pt amb w/o AD and independent to stairwell where he ascended 4 flights w/ R hand rail and independent.  Pt maintains safety w/ stairs reciprocal gait pattern and minimal stop at beginning of flight.  Pt amb to outdoors, opening all doors and changing terrain (tile > concrete, concrete > bricks, asphalt).  Pt amb on sloped surface performing quick stops/starts w/o LOB.  Pt amb across grass, mulch and stepping up/down on curbs.  Pt negotiated slalom around car barriers.  Pt negotiated 8 steps w/ 1 rail.  Pt requires occasional seated rest breaks, education given for self-monitoring of fatigue for safety.  Pt returned to hospital, opening heavy door and then ascending 48 steps w/o rail and close supervision and noted fatigue last flight to 1/2 flight, but w/o LOB.  Pt returned to room and remained sitting in recliner w/ all needs in reach, pt Mod I in room w/o chair alarms required.     Therapy Documentation Precautions:  Precautions Precautions: Fall Precaution/Restrictions Comments: Mild R-sided hemiparesis Restrictions Weight Bearing Restrictions Per Provider Order: No General:   Vital Signs:   Pain:0/10    Therapy/Group: Individual Therapy  Naji Mehringer P Rylin Saez 09/14/2023, 10:18 AM

## 2023-09-14 NOTE — Progress Notes (Signed)
 Occupational Therapy Session Note  Patient Details  Name: Tyler Hoffman MRN: [id number] Date of Birth: [birth year]  Today's Date: 09/14/2023 OT Individual Time: 1415-1520 OT Individual Time Calculation (min): 65 min    Short Term Goals: Week 1:  OT Short Term Goal 1 (Week 1): STGs=LTGs due to patient's estimated length of stay.  Skilled Therapeutic Interventions/Progress Updates: Patient concurred to participate in OT treatment this session.  He stated, All the outpatient clinics have waiting lines; so, I do not know when I will be able to get into therapy after I leave here in a couple of days.   Session focus was fine motor coordination and dexterity HEP to help increase UE function while waiting for outpatient OT openening.   Patient was able to follow and complete the program.   As well, he practice precise writing skills as he also stated writing is important.  Patient will continue to benefit from OT services through discharge to increase self care and IaDLs  Continiue patient POC     Therapy Documentation Precautions:  Precautions Precautions: Fall Precaution/Restrictions Comments: Mild R-sided hemiparesis Restrictions Weight Bearing Restrictions Per Provider Order: No   Pain:denied     Therapy/Group: Individual Therapy  [place] Catheryn Peggs 09/14/2023, 5:49 PM

## 2023-09-14 NOTE — Progress Notes (Signed)
 Physical Therapy Session Note  Patient Details  Name: Tyler Hoffman MRN: [id number] Date of Birth: 29-Dec-1975  Today's Date: 09/14/2023 PT Individual Time: 8691-8651 PT Individual Time Calculation (min): 40 min   Short Term Goals: Week 1:  PT Short Term Goal 1 (Week 1): STG = LTG due to ELOS  Skilled Therapeutic Interventions/Progress Updates: Patient sitting in recliner on entrance to room. Patient alert and agreeable to PT session.   Patient reported no pain during session. Pt reported wanting to work on increasing cardiovascular endurance/B LE strength this session (self report of what pt felt to be biggest deficit). Pt ambulated throughout session independently without AD and no LOB. Pt ambulated while holding tennis racket and balancing cone on small base with pt problem solving to maintain (softer steps to decrease vibration and loosening stiffness of grip on handle). 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 Pt then on NuStep; level 10 for 2.5 min - level 8 2.5 min - level 6 2 min; level 4 2 min; level 2 for 2 min; 731 total steps; 61 avg spm; 2.5 avg METs; 0.4 miles with no rest break.  Patient sitting in recliner at end of session with brakes locked, independent in room, and all needs within reach.      Therapy Documentation Precautions:  Precautions Precautions: Fall Precaution/Restrictions Comments: Mild R-sided hemiparesis Restrictions Weight Bearing Restrictions Per Provider Order: No   Therapy/Group: Individual Therapy  Nereyda Bowler PTA 09/14/2023, 1:51 PM

## 2023-09-14 NOTE — IPOC Note (Signed)
 Overall Plan of Care [facility]) Patient Details Name: Tyler Hoffman MRN: [id number] DOB: 07/20/1975  Admitting Diagnosis: Left pontine stroke [facility])  Hospital Problems: Principal Problem:   Left pontine stroke [facility])     Functional Problem List: Nursing Bladder, Bowel, Edema, Endurance, Medication Management, Pain, Safety  PT Balance, Endurance, Motor  OT Balance, Endurance, Motor  SLP    TR         Basic ADL's: OT Bathing, Dressing, Toileting     Advanced  ADL's: OT Simple Meal Preparation, Light Housekeeping     Transfers: PT Bed Mobility, Bed to Chair, Customer service manager, Tub/Shower     Locomotion: PT Ambulation, Stairs     Additional Impairments: OT Fuctional Use of Upper Extremity  SLP        TR      Anticipated Outcomes Item Anticipated Outcome  Self Feeding    Swallowing      Basic self-care  Mod I  Toileting  Mod I   Bathroom Transfers Mod I  Bowel/Bladder  manage bowels with medications/manage bladder with toileting assistance  Transfers  modI  Locomotion  modI ambulatory  Communication     Cognition     Pain  <4 w/ prns  Safety/Judgment  manage safety with mod I assistance   Therapy Plan: PT Intensity: Minimum of 1-2 x/day ,45 to 90 minutes PT Frequency: 5 out of 7 days PT Duration Estimated Length of Stay: 3-5 days OT Intensity: Minimum of 1-2 x/day, 45 to 90 minutes OT Frequency: 5 out of 7 days OT Duration/Estimated Length of Stay: 5-7 days     Team Interventions: Nursing Interventions Bladder Management, Bowel Management, Disease Management/Prevention, Medication Management, Discharge Planning  PT interventions Ambulation/gait training, Disease management/prevention, Stair training, Visual/perceptual remediation/compensation, Warden/ranger, Patient/family education, Therapeutic Activities, Cognitive remediation/compensation, Psychosocial support, Therapeutic Exercise, Community reintegration, Functional mobility training,  UE/LE Strength taining/ROM, Discharge planning, Neuromuscular re-education, UE/LE Coordination activities  OT Interventions Warden/ranger, Community reintegration, Discharge planning, Disease mangement/prevention, DME/adaptive equipment instruction, Functional electrical stimulation, Functional mobility training, Pain management, Patient/family education, Psychosocial support, Neuromuscular re-education, Self Care/advanced ADL retraining, Skin care/wound managment, Splinting/orthotics, Therapeutic Activities, Therapeutic Exercise, UE/LE Strength taining/ROM, UE/LE Coordination activities, Visual/perceptual remediation/compensation  SLP Interventions    TR Interventions    SW/CM Interventions Discharge Planning, Psychosocial Support, Patient/Family Education   Barriers to Discharge MD  Medical stability  Nursing Decreased caregiver support, Home environment access/layout Discharge: House  Discharge Home Layout: One level  Discharge Home Access: Ramped entrance; Discharge plan: return to parents home (which is local) with parents, spouse, 4 kids  PT None    OT Decreased caregiver support    SLP      SW Insurance for SNF coverage, Decreased caregiver support, Lack of/limited family support     Team Discharge Planning: Destination: PT-Home ,OT- Home , SLP-  Projected Follow-up: PT-Outpatient PT, OT-  Outpatient OT, SLP-  Projected Equipment Needs: PT-None recommended by PT, OT- To be determined, SLP-  Equipment Details: PT- , OT-  Patient/family involved in discharge planning: PT- Patient,  OT-Patient, SLP-   MD ELOS: 5 days Medical Rehab Prognosis:  Excellent Assessment: The patient has been admitted for CIR therapies with the diagnosis of left paramedian pontine CVA s/p TNK . The team will be addressing functional mobility, strength, stamina, balance, safety, adaptive techniques and equipment, self-care, bowel and bladder mgt, patient and caregiver education. Goals have been  set at mod I. Anticipated discharge destination is home.  See Team Conference Notes for weekly updates to the plan of care

## 2023-09-14 NOTE — Progress Notes (Signed)
 PROGRESS NOTE   Subjective/Complaints: Asks about his Trulicity  and Tresiba  He took Cialis at home for ED and asks if it is safe to resume, discussed dietary strategies to improve blood flow in body  ROS: Patient denies fever, chills, new vision changes, dizziness, nausea, vomiting, diarrhea,  constipation, shortness of breath or chest pain, headache, or mood change.  Objective:   No results found. Recent Labs    09/12/23 0514  WBC 7.7  HGB 16.8  HCT 48.3  PLT 225   Recent Labs    09/12/23 0514 09/13/23 0436  NA 134* 137  K 3.5 4.2  CL 102 104  CO2 21* 25  GLUCOSE 132* 130*  BUN 24* 23*  CREATININE 0.98 1.08  CALCIUM  8.9 8.8*    Intake/Output Summary (Last 24 hours) at 09/14/2023 1034 Last data filed at 09/13/2023 2202 Gross per 24 hour  Intake 476 ml  Output 130 ml  Net 346 ml        Physical Exam: Vital Signs Blood pressure 126/75, pulse 62, temperature 97.7 F (36.5 C), temperature source Oral, resp. rate 18, height 5' 3 (1.6 m), weight 83.9 kg, SpO2 100%.  General: No apparent distress, sitting in WC HEENT: [place],AT, MMM Neck: Supple without JVD or lymphadenopathy Heart: Reg rate and rhythm. No murmurs rubs or gallops, cardiac monitor L chest Chest: CTA bilaterally without wheezes, rales, or rhonchi; no distress Abdomen: Soft, non-tender, non-distended, bowel sounds positive. Extremities: No clubbing, cyanosis, or edema. Pulses are 2+ Psych: Pt's affect is appropriate. Pt is cooperative. Pleasant  Skin: Clean and intact without signs of breakdown Neuro: Alert and oriented x 3, good insight and awareness, fluent, follows commands, mild right facial weakness    MOTOR: RUE: 4/5 Deltoid, 4+/5 Biceps, 4+/5 Triceps,4/5 Grip LUE: 5/5 Deltoid, 5/5 Biceps, 5/5 Triceps, 5/5 Grip RLE: HF 4/5, KE 4+/5, ADF 4+/5, APF 4+/5 LLE: HF 5/5, KE 5/5, ADF 5/5, APF 5/5, stable 7/5   SENSORY: Normal to touch all 4  extremities   No hypertonia noted         Assessment/Plan: 1. Functional deficits which require 3+ hours per day of interdisciplinary therapy in a comprehensive inpatient rehab setting. Physiatrist is providing close team supervision and 24 hour management of active medical problems listed below. Physiatrist and rehab team continue to assess barriers to discharge/monitor patient progress toward functional and medical goals  Care Tool:  Bathing    Body parts bathed by patient: Right arm, Left arm, Chest, Abdomen, Front perineal area, Right upper leg, Buttocks, Left upper leg, Right lower leg, Left lower leg, Face         Bathing assist Assist Level: Supervision/Verbal cueing     Upper Body Dressing/Undressing Upper body dressing   What is the patient wearing?: Pull over shirt    Upper body assist Assist Level: Independent with assistive device    Lower Body Dressing/Undressing Lower body dressing      What is the patient wearing?: Underwear/pull up, Pants     Lower body assist Assist for lower body dressing: Set up assist     Toileting Toileting    Toileting assist Assist for toileting: Supervision/Verbal cueing  Transfers Chair/bed transfer  Transfers assist     Chair/bed transfer assist level: Independent     Locomotion Ambulation   Ambulation assist      Assist level: Independent Assistive device: No Device Max distance: 350+   Walk 10 feet activity   Assist     Assist level: Independent Assistive device: No Device   Walk 50 feet activity   Assist    Assist level: Independent Assistive device: No Device    Walk 150 feet activity   Assist    Assist level: Independent Assistive device: No Device    Walk 10 feet on uneven surface  activity   Assist     Assist level: Independent Assistive device: Other (comment) (no device.)   Wheelchair     Assist Is the patient using a wheelchair?: No              Wheelchair 50 feet with 2 turns activity    Assist            Wheelchair 150 feet activity     Assist          Blood pressure 126/75, pulse 62, temperature 97.7 F (36.5 C), temperature source Oral, resp. rate 18, height 5' 3 (1.6 m), weight 83.9 kg, SpO2 100%.  Medical Problem List and Plan: 1. Functional deficits secondary to left paramedian pontine CVA s/p TNK             -patient may  shower             -ELOS/Goals: PT, OT modified to independent, 4 to 6 days             - Continue CIR  - DC planned for Monday  -provided education to decrease stroke risk  2.  Antithrombotics: -DVT/anticoagulation:  Pharmaceutical: Lovenox  added- pt requested DC- discussed risks/benefits and pt wanted to DC this, ok to DC due to distance he is walking             -antiplatelet therapy: continue ASA/Brilinta  X 4 weeks followed by Brilinta  alone.   3. Pain Management: Tylenol  prn.  4. Mood/Behavior/Sleep: LCSW to follow for evaluation and support             -antipsychotic agents: N/A 5. Neuropsych/cognition: This patient is capable of making decisions on his own behalf. 6. Skin/Wound Care: Routine pressure relief measures.  7. Fluids/Electrolytes/Nutrition: Monitor I/O. Check CMET in am 8.  HTN: Monitor BP TID--was on Bisoprolol  and Lisinopril .  BP goal less than 180/105 -continue on low dose Bisoprolol . Has had HR improving.  -7/4 BP stable, continue to monitor     09/14/2023    4:22 AM 09/13/2023    8:48 PM 09/13/2023    2:14 PM  Vitals with BMI  Systolic 126 124 893  Diastolic 75 77 71  Pulse 62 66 76    9. T2DM: Hgb A1c-8.1. Was on tresiba  60 units, trulicity  and jardiance  at home--compliance?             --BS relatively ranging from 100-160. Will resume insulin  glargine at 10 units HS             --encourage fluid intake as on Jardiance  and BUN trending up.   Provided dietary education CBG (last 3)  Recent Labs    09/13/23 1648 09/13/23 2201 09/14/23 0642   GLUCAP 138* 191* 116*  Had elevated CBGs last night but overall controlled, monitor trend    10. Pre-renal azotemia: BUN- 22. Encourage fluid  intake.  Recheck labs -7/3 BUNs a little higher at 24, encourage oral fluids and monitor for now, asked nursing to enourage 11. OSA: Has been compliant with CPAP use.  Discussed benefits of CPAP 12. Dyslipidemia: Trig-155/LDL-98.  Statin 13. Obesity class I: Educated on diet and exercise to promote health and weight loss.  Dietitian to see patient             Body mass index is 33.16 kg/m. 14. CAD DES to LAD 07/11/20.  Continue ASA/Brilinta  as above 15. Constipation: PRN senokot, PRN sorbitol   - 7/3 improved after bowel movement today  -7/4 LBM today, pt to try to eat foods that help promote Bms, continue PRN laxatives 16.  Elevated bilirubin: Recheck labs Monday      LOS: 3 days A FACE TO FACE EVALUATION WAS PERFORMED  Sven SQUIBB Roanne Haye 09/14/2023, 10:34 AM

## 2023-09-14 NOTE — Progress Notes (Signed)
 Occupational Therapy Session Note  Patient Details  Name: Tyler Hoffman MRN: [id number] Date of Birth: 07-25-1975  Today's Date: 09/14/2023 OT Individual Time: 9294-9183 OT Individual Time Calculation (min): 71 min    Short Term Goals: Week 1:  OT Short Term Goal 1 (Week 1): STGs=LTGs due to patient's estimated length of stay.  Skilled Therapeutic Interventions/Progress Updates:  Pt greeted resting in bed for skilled OT session with focus on functional mobility, IADL participation, discharge planning, and RUE/RLE NMR.  Pain: Pt with no reports of pain. OT offering intermediate rest breaks and positioning suggestions throughout session to address pain/fatigue and maximize participation/safety in session.   Functional Transfers: Pt completes all transfers and greater than household-level ambulation with supervision progressing to independent without use of AD.   Self Care Tasks: In ADL apartment, pt and OT review home shower layout (both here and abroad). Education provided on fall risk prevention, as well as, possible DME. Pt completes tub/shower and walk-in shower transfers with supervision progressing to Mod I.   Pt completes simulated meal prep activity, including management of cooking utensils with RUE. Discussed means of energy conservation/safety.  Pt then simulates laundry transport up/down x3 flights of stairs and loading/unloading. Education provided on carrying laundry basket on L-hip/using L-arm and to avoid carrying too many clothes in basket manage potential spillage/tripping.    Lastly patient uses 8# dowel bar to simulate weed-eating area similar to back-yard. Pt side-steps ~7ft while carrying dowel bar at supervision level.   Strong emphasize placed on energy conservation technique, including prioritizing/pacing, when re-entering into daily schedule upon discharge. Pt receptive and in agreement.   Therapeutic Exercise: Pt instructed in x3.5 mins of forward walking on level 2  incline/1.5 speed and x2 mins of backward walking on no incline/.7 speed on treadmill for BLE conditioning, coordination, and activity tolerance. Pt supports BUE on machine, no LOB. Pt then completes x10 mins on nustep for BUE/BLE endurance/integration. Education provided on integration of physical activity/fitness into daily routine for overall health/wellness.   Pt remained sitting in EOB with 4Ps assessed and immediate needs met. Pt continues to be appropriate for skilled OT intervention to promote further functional independence in ADLs/IADLs.   Therapy Documentation Precautions:  Precautions Precautions: Fall Precaution/Restrictions Comments: Mild R-sided hemiparesis Restrictions Weight Bearing Restrictions Per Provider Order: No   Therapy/Group: Individual Therapy  Nereida Habermann, OTR/L, MSOT  09/14/2023, 5:29 AM

## 2023-09-15 DIAGNOSIS — I639 Cerebral infarction, unspecified: Secondary | ICD-10-CM | POA: Diagnosis not present

## 2023-09-15 LAB — GLUCOSE, CAPILLARY
Glucose-Capillary: 118 mg/dL — ABNORMAL HIGH (ref 70–99)
Glucose-Capillary: 117 mg/dL — ABNORMAL HIGH (ref 70–99)
Glucose-Capillary: 173 mg/dL — ABNORMAL HIGH (ref 70–99)
Glucose-Capillary: 163 mg/dL — ABNORMAL HIGH (ref 70–99)

## 2023-09-15 NOTE — Plan of Care (Signed)
  Problem: RH Balance Goal: LTG Patient will maintain dynamic standing with ADLs (OT) Description: LTG:  Patient will maintain dynamic standing balance with assist during activities of daily living (OT)  Outcome: Completed/Met Flowsheets (Taken 09/12/2023 1023 by [place] Nereida HERO, OT) LTG: Pt will maintain dynamic standing balance during ADLs with: Independent with assistive device   Problem: RH Bathing Goal: LTG Patient will bathe all body parts with assist levels (OT) Description: LTG: Patient will bathe all body parts with assist levels (OT) Outcome: Completed/Met Flowsheets (Taken 09/12/2023 1023 by [place] Nereida HERO, OT) LTG: Pt will perform bathing with assistance level/cueing: Independent with assistive device    Problem: RH Dressing Goal: LTG Patient will perform lower body dressing w/assist (OT) Description: LTG: Patient will perform lower body dressing with assist, with/without cues in positioning using equipment (OT) Outcome: Completed/Met Flowsheets (Taken 09/12/2023 1023 by [place] Nereida HERO, OT) LTG: Pt will perform lower body dressing with assistance level of: Independent with assistive device   Problem: RH Toileting Goal: LTG Patient will perform toileting task (3/3 steps) with assistance level (OT) Description: LTG: Patient will perform toileting task (3/3 steps) with assistance level (OT)  Outcome: Completed/Met Flowsheets (Taken 09/12/2023 1023 by [place] Nereida HERO, OT) LTG: Pt will perform toileting task (3/3 steps) with assistance level: Independent with assistive device   Problem: RH Functional Use of Upper Extremity Goal: LTG Patient will use RT/LT upper extremity as a (OT) Description: LTG: Patient will use right/left upper extremity as a stabilizer/gross assist/diminished/nondominant/dominant level with assist, with/without cues during functional activity (OT) Outcome: Completed/Met Flowsheets (Taken 09/12/2023 1023 by [place] Nereida HERO,  OT) LTG: Use of upper extremity in functional activities: RUE as nondominant level   Problem: RH Simple Meal Prep Goal: LTG Patient will perform simple meal prep w/assist (OT) Description: LTG: Patient will perform simple meal prep with assistance, with/without cues (OT). Outcome: Completed/Met Flowsheets (Taken 09/12/2023 1023 by [place] Nereida HERO, OT) LTG: Pt will perform simple meal prep with assistance level of: Independent with assistive device   Problem: RH Light Housekeeping Goal: LTG Patient will perform light housekeeping w/assist (OT) Description: LTG: Patient will perform light housekeeping with assistance, with/without cues (OT). Outcome: Completed/Met Flowsheets (Taken 09/12/2023 1023 by [place] Nereida HERO, OT) LTG: Pt will perform light housekeeping with assistance level of: Independent with assistive device   Problem: RH Toilet Transfers Goal: LTG Patient will perform toilet transfers w/assist (OT) Description: LTG: Patient will perform toilet transfers with assist, with/without cues using equipment (OT) Outcome: Completed/Met Flowsheets (Taken 09/12/2023 1023 by [place] Nereida HERO, OT) LTG: Pt will perform toilet transfers with assistance level of: Independent with assistive device   Problem: RH Tub/Shower Transfers Goal: LTG Patient will perform tub/shower transfers w/assist (OT) Description: LTG: Patient will perform tub/shower transfers with assist, with/without cues using equipment (OT) Outcome: Completed/Met Flowsheets (Taken 09/12/2023 1023 by [place] Nereida HERO, OT) LTG: Pt will perform tub/shower stall transfers with assistance level of: Independent with assistive device

## 2023-09-15 NOTE — Progress Notes (Signed)
 Checked in with patient to see if he needed any help with home CPAP, he states he does not. No further needs at this time

## 2023-09-15 NOTE — Progress Notes (Signed)
 Occupational Therapy Session Note  Patient Details  Name: Obrian Bulson MRN: [id number] Date of Birth: February 19, 1976  Today's Date: 09/15/2023 OT Individual Time: 8944-8844 OT Individual Time Calculation (min): 60 min    Short Term Goals: Week 1:  OT Short Term Goal 1 (Week 1): STGs=LTGs due to patient's estimated length of stay.  Skilled Therapeutic Interventions/Progress Updates:      Therapy Documentation Precautions:  Precautions Precautions: Fall Precaution/Restrictions Comments: Mild R-sided hemiparesis Restrictions Weight Bearing Restrictions Per Provider Order: No General: Pt seated in W/C upon OT arrival, agreeable to OT.  Pain: no pain reported  Balance: OT providing skilled intervention with dynamic standing balance strategies, proprioceptive input, FMC, depth perception, scanning, etc by playing various games on Wii. Pt able to complete activities with mod I, reaching out of BOS In various planes, able to change direction quickly without any LOB/SOB with no seated rest breaks. Pt also able to ambulate from room><day room without LOB/SOB no AD.  Other Treatments: OT providing therapeutic use of self in order to build rapport and discuss patient current situation and goals for therapy. OT providing information of vestibular rehab/therapies once back in [place] in order to assist with depth perception deficits per pr report. OT and pt also potentially discussing opportunity light duty for work in order to provide necessary adjustment after stoke.    Pt seated in recliner at end of session with call light within reach and 4Ps assessed.    Therapy/Group: Individual Therapy  Camie Hoe, OTD, OTR/L 09/15/2023, 12:20 PM

## 2023-09-15 NOTE — Progress Notes (Signed)
 PROGRESS NOTE   Subjective/Complaints: Working with PT in gym Asks about airplane travel, recommended frequently getting up to walk, minimizing sedentary time  ROS: Patient denies fever, chills, new vision changes, dizziness, nausea, vomiting, diarrhea,  constipation, shortness of breath or chest pain, headache, or mood change.  Objective:   No results found. No results for input(s): WBC, HGB, HCT, PLT in the last 72 hours.  Recent Labs    09/13/23 0436  NA 137  K 4.2  CL 104  CO2 25  GLUCOSE 130*  BUN 23*  CREATININE 1.08  CALCIUM  8.8*    Intake/Output Summary (Last 24 hours) at 09/15/2023 1604 Last data filed at 09/15/2023 0800 Gross per 24 hour  Intake 712 ml  Output --  Net 712 ml        Physical Exam: Vital Signs Blood pressure 113/73, pulse 70, temperature 97.8 F (36.6 C), temperature source Oral, resp. rate 19, height 5' 3 (1.6 m), weight 83.9 kg, SpO2 97%.  General: No apparent distress, sitting in WC HEENT: [place],AT, MMM Neck: Supple without JVD or lymphadenopathy Heart: Reg rate and rhythm. No murmurs rubs or gallops, cardiac monitor L chest Chest: CTA bilaterally without wheezes, rales, or rhonchi; no distress Abdomen: Soft, non-tender, non-distended, bowel sounds positive. Extremities: No clubbing, cyanosis, or edema. Pulses are 2+ Psych: Pt's affect is appropriate. Pt is cooperative. Pleasant  Skin: Clean and intact without signs of breakdown Neuro: Alert and oriented x 3, good insight and awareness, fluent, follows commands, mild right facial weakness    MOTOR: RUE: 4/5 Deltoid, 4+/5 Biceps, 4+/5 Triceps,4/5 Grip LUE: 5/5 Deltoid, 5/5 Biceps, 5/5 Triceps, 5/5 Grip RLE: HF 4/5, KE 4+/5, ADF 4+/5, APF 4+/5 LLE: HF 5/5, KE 5/5, ADF 5/5, APF 5/5, stable 7/6   SENSORY: Normal to touch all 4 extremities   No hypertonia noted         Assessment/Plan: 1. Functional deficits which  require 3+ hours per day of interdisciplinary therapy in a comprehensive inpatient rehab setting. Physiatrist is providing close team supervision and 24 hour management of active medical problems listed below. Physiatrist and rehab team continue to assess barriers to discharge/monitor patient progress toward functional and medical goals  Care Tool:  Bathing    Body parts bathed by patient: Right arm, Left arm, Chest, Abdomen, Front perineal area, Right upper leg, Buttocks, Left upper leg, Right lower leg, Left lower leg, Face         Bathing assist Assist Level: Independent with assistive device     Upper Body Dressing/Undressing Upper body dressing   What is the patient wearing?: Pull over shirt    Upper body assist Assist Level: Independent with assistive device    Lower Body Dressing/Undressing Lower body dressing      What is the patient wearing?: Underwear/pull up, Pants     Lower body assist Assist for lower body dressing: Independent with assitive device     Toileting Toileting    Toileting assist Assist for toileting: Independent with assistive device     Transfers Chair/bed transfer  Transfers assist     Chair/bed transfer assist level: Independent     Locomotion Ambulation   Ambulation  assist      Assist level: Independent Assistive device: No Device Max distance: 350+   Walk 10 feet activity   Assist     Assist level: Independent Assistive device: No Device   Walk 50 feet activity   Assist    Assist level: Independent Assistive device: No Device    Walk 150 feet activity   Assist    Assist level: Independent Assistive device: No Device    Walk 10 feet on uneven surface  activity   Assist     Assist level: Independent Assistive device: Other (comment) (no device.)   Wheelchair     Assist Is the patient using a wheelchair?: No   Wheelchair activity did not occur: N/A         Wheelchair 50 feet with 2  turns activity    Assist    Wheelchair 50 feet with 2 turns activity did not occur: N/A       Wheelchair 150 feet activity     Assist  Wheelchair 150 feet activity did not occur: N/A       Blood pressure 113/73, pulse 70, temperature 97.8 F (36.6 C), temperature source Oral, resp. rate 19, height 5' 3 (1.6 m), weight 83.9 kg, SpO2 97%.  Medical Problem List and Plan: 1. Functional deficits secondary to left paramedian pontine CVA s/p TNK             -patient may  shower             -ELOS/Goals: PT, OT modified to independent, 4 to 6 days             - Continue CIR  - DC planned for Monday  -provided education to decrease stroke risk  2.  Antithrombotics: -DVT/anticoagulation:  Pharmaceutical: Lovenox  added- pt requested DC- discussed risks/benefits and pt wanted to DC this, ok to DC due to distance he is walking             -antiplatelet therapy: continue ASA/Brilinta  X 4 weeks followed by Brilinta  alone.   3. Pain Management: Tylenol  prn.  4. Mood/Behavior/Sleep: LCSW to follow for evaluation and support             -antipsychotic agents: N/A 5. Neuropsych/cognition: This patient is capable of making decisions on his own behalf. 6. Skin/Wound Care: Routine pressure relief measures.  7. Fluids/Electrolytes/Nutrition: Monitor I/O. Check CMET in am 8.  HTN: Monitor BP TID--was on Bisoprolol  and Lisinopril .  BP goal less than 180/105 -continue on low dose Bisoprolol . Has had HR improving.  -7/4 BP stable, continue to monitor     09/15/2023    1:30 PM 09/15/2023    6:00 AM 09/14/2023    8:21 PM  Vitals with BMI  Systolic 113 114 882  Diastolic 73 71 77  Pulse 70 65 --    9. T2DM: Hgb A1c-8.1. Was on tresiba  60 units, trulicity  and jardiance  at home--compliance?             --BS relatively ranging from 100-160. Will resume insulin  glargine at 10 units HS             --encourage fluid intake as on Jardiance  and BUN trending up.   Provided dietary  education  Discussed inability to tolerate metformin CBG (last 3)  Recent Labs    09/14/23 2128 09/15/23 0648 09/15/23 1128  GLUCAP 127* 118* 117*  Had elevated CBGs last night but overall controlled, monitor trend   10. Pre-renal azotemia: BUN- 22. Encourage fluid  intake.  Recheck labs -7/3 BUNs a little higher at 24, encourage oral fluids and monitor for now, asked nursing to enourage  11. OSA: Has been compliant with CPAP use.  Discussed benefits of CPAP  12. Dyslipidemia: Trig-155/LDL-98.  Continue Statin  13. Obesity class I: Educated on diet and exercise to promote health and weight loss.  Dietitian to see patient             Body mass index is 33.16 kg/m.  Discussed supplement berberine  14. CAD DES to LAD 07/11/20.  Continue ASA/Brilinta  as above  15. Constipation: PRN senokot, PRN sorbitol   - 7/3 improved after bowel movement today   pt to try to eat foods that help promote Bms, continue PRN laxatives  16.  Elevated bilirubin: Recheck labs Monday      LOS: 4 days A FACE TO FACE EVALUATION WAS PERFORMED  Carzell Saldivar P Davyn Morandi 09/15/2023, 4:04 PM

## 2023-09-16 ENCOUNTER — Other Ambulatory Visit (HOSPITAL_COMMUNITY): Payer: Self-pay

## 2023-09-16 ENCOUNTER — Telehealth: Payer: Self-pay

## 2023-09-16 DIAGNOSIS — I639 Cerebral infarction, unspecified: Secondary | ICD-10-CM | POA: Diagnosis not present

## 2023-09-16 LAB — HEPATIC FUNCTION PANEL
ALT: 33 U/L (ref 0–44)
AST: 27 U/L (ref 15–41)
Albumin: 3.8 g/dL (ref 3.5–5.0)
Alkaline Phosphatase: 51 U/L (ref 38–126)
Bilirubin, Direct: <0.1 mg/dL (ref 0.0–0.2)
Total Bilirubin: 1.1 mg/dL (ref 0.0–1.2)
Total Protein: 6.3 g/dL — ABNORMAL LOW (ref 6.5–8.1)

## 2023-09-16 LAB — CBC
HCT: 47 % (ref 39.0–52.0)
Hemoglobin: 16.3 g/dL (ref 13.0–17.0)
MCH: 32.7 pg (ref 26.0–34.0)
MCHC: 34.7 g/dL (ref 30.0–36.0)
MCV: 94.4 fL (ref 80.0–100.0)
Platelets: 213 K/uL (ref 150–400)
RBC: 4.98 MIL/uL (ref 4.22–5.81)
RDW: 12.2 % (ref 11.5–15.5)
WBC: 7.7 K/uL (ref 4.0–10.5)
nRBC: 0 % (ref 0.0–0.2)

## 2023-09-16 LAB — BASIC METABOLIC PANEL WITH GFR
Anion gap: 11 (ref 5–15)
BUN: 21 mg/dL — ABNORMAL HIGH (ref 6–20)
CO2: 26 mmol/L (ref 22–32)
Calcium: 9.1 mg/dL (ref 8.9–10.3)
Chloride: 101 mmol/L (ref 98–111)
Creatinine, Ser: 1.02 mg/dL (ref 0.61–1.24)
GFR, Estimated: >60 mL/min (ref >60)
Glucose, Bld: 129 mg/dL — ABNORMAL HIGH (ref 70–99)
Potassium: 3.8 mmol/L (ref 3.5–5.1)
Sodium: 138 mmol/L (ref 135–145)

## 2023-09-16 LAB — GLUCOSE, CAPILLARY
Glucose-Capillary: 122 mg/dL — ABNORMAL HIGH (ref 70–99)
Glucose-Capillary: 96 mg/dL (ref 70–99)

## 2023-09-16 MED ORDER — TICAGRELOR 90 MG PO TABS: 90.0000 mg | ORAL_TABLET | Freq: Two times a day (BID) | ORAL | 0 refills | Status: DC

## 2023-09-16 MED ORDER — TICAGRELOR 90 MG PO TABS
90.0000 mg | ORAL_TABLET | Freq: Two times a day (BID) | ORAL | 0 refills | Status: AC
  Filled 2023-09-16: qty 60, 30d supply, fill #0

## 2023-09-16 MED ORDER — TRULICITY 1.5 MG/0.5ML ~~LOC~~ SOAJ: 1.5000 mg | SUBCUTANEOUS | 0 refills | Status: DC

## 2023-09-16 MED ORDER — TRESIBA FLEXTOUCH 100 UNIT/ML ~~LOC~~ SOPN: 10.0000 [IU] | PEN_INJECTOR | Freq: Every day | SUBCUTANEOUS | Status: DC

## 2023-09-16 MED ORDER — BISOPROLOL FUMARATE 5 MG PO TABS
2.5000 mg | ORAL_TABLET | Freq: Every day | ORAL | 0 refills | Status: AC
  Filled 2023-09-16: qty 15, 30d supply, fill #0

## 2023-09-16 MED ORDER — TRESIBA FLEXTOUCH 100 UNIT/ML ~~LOC~~ SOPN: 60.0000 [IU] | PEN_INJECTOR | Freq: Every day | SUBCUTANEOUS | 0 refills | Status: DC

## 2023-09-16 MED ORDER — TRULICITY 1.5 MG/0.5ML ~~LOC~~ SOAJ
1.5000 mg | SUBCUTANEOUS | 0 refills | Status: AC
  Filled 2023-09-16: qty 2, 28d supply, fill #0

## 2023-09-16 MED ORDER — BISOPROLOL FUMARATE 2.5 MG PO TABS: 2.5000 mg | ORAL_TABLET | Freq: Every day | ORAL | 0 refills | Status: DC

## 2023-09-16 MED ORDER — TICAGRELOR 90 MG PO TABS
90.0000 mg | ORAL_TABLET | Freq: Two times a day (BID) | ORAL | 0 refills | Status: DC
  Filled 2023-09-16: qty 60, 30d supply, fill #0

## 2023-09-16 NOTE — Addendum Note (Signed)
 Addended by: WENDELL ARLAND RAMAN on: 09/16/2023 08:40 AM   Modules accepted: Orders

## 2023-09-16 NOTE — Addendum Note (Signed)
 Addended by: WENDELL ARLAND RAMAN on: 09/16/2023 09:33 AM   Modules accepted: Orders

## 2023-09-16 NOTE — Progress Notes (Signed)
 Inpatient Rehabilitation Care Coordinator Discharge Note   Patient Details  Name: Tyler Hoffman MRN: [id number] Date of Birth: 28-Apr-1975   Discharge location: D/c to his aunt's home until family returns from week vacation this Saturday. Flight for [place] on 09/28/23  Length of Stay: 4 days  Discharge activity level: Mod I  Home/community participation: LImited  Patient response un:Yzjouy Literacy - How often do you need to have someone help you when you read instructions, pamphlets, or other written material from your doctor or pharmacy?: Rarely  Patient response un:Dnrpjo Isolation - How often do you feel lonely or isolated from those around you?: Rarely  Services provided included: MD, RD, PT, OT, SLP, RN, CM, TR, Pharmacy, SW, Neuropsych  Financial Services:  Field seismologist Utilized: [employer]  Choices offered to/list presented to: patient and family  Follow-up services arranged:  Outpatient    Outpatient Servicies: [facility] for PT/OT. Will need to resume care and coordiante referral for care needs in [place] for future outpatient PT/OT needs      Patient response to transportation need: Is the patient able to respond to transportation needs?: Yes In the past 12 months, has lack of transportation kept you from medical appointments or from getting medications?: No In the past 12 months, has lack of transportation kept you from meetings, work, or from getting things needed for daily living?: No   Patient/Family verbalized understanding of follow-up arrangements:  Yes  Individual responsible for coordination of the follow-up plan: contact pt or pt wife [place]  Confirmed correct DME delivered: Graeme DELENA Jude 09/16/2023    Comments (or additional information):    Buddy Loeffelholz A Jude

## 2023-09-16 NOTE — Telephone Encounter (Signed)
 Pharmacy Patient Advocate Encounter   Received notification from Onbase that prior authorization for Trulicity  1.5MG /0.5ML auto-injectors is required/requested.   Insurance verification completed.   The patient is insured through [employer] .   Per test claim: Refill too soon. PA is not needed at this time. Medication was filled 09/16/23. Next eligible fill date is 09/29/23.

## 2023-09-16 NOTE — Progress Notes (Addendum)
 Nutrition Education Note  RD consulted for nutrition education regarding stroke and diabetes. Patient states that he knows what to eat, he just doesn't always follow it. He frequently overeats when his blood sugar gets too low. Encouraged patient to have a plan of what he will eat/drink when his blood sugar drops. Reviewed guidelines for treatment of low blood sugar. He also plans to do more meal and snack planning to help him stay on track. Wife unavailable, she went to the beach. Patient to share information with her. RD contact information provided for any questions she may have.   Lab Results  Component Value Date   HGBA1C 8.1 (H) 08/30/2023     RD provided Heart Healthy, Consistent Carbohydrate Nutrition Therapy handout from the Academy of Nutrition [employer] Dietetics. Provided examples on ways to decrease sodium intake in diet. Discouraged intake of processed foods and use of salt shaker. Encouraged fresh fruits and vegetables as well as whole grain sources of carbohydrates to maximize fiber intake.   Discussed different food groups and their effects on blood sugar, emphasizing carbohydrate-containing foods. Provided list of carbohydrates and recommended serving sizes of common foods.  Discussed importance of controlled and consistent carbohydrate intake throughout the day. Provided examples of ways to balance meals/snacks and encouraged intake of high-fiber, whole grain complex carbohydrates. Teach back method used.  Expect good compliance.  Body mass index is 32.77 kg/m. Pt meets criteria for obesity based on current BMI.  Current diet order is heart healthy carb modified, patient is consuming approximately 100% of meals at this time. Labs and medications reviewed. No further nutrition interventions warranted at this time. RD contact information provided. If additional nutrition issues arise, please re-consult RD.    Suzen HUNT RD, LDN, CNSC Contact via secure chat. If unavailable, use  group chat RD Inpatient.

## 2023-09-16 NOTE — Telephone Encounter (Signed)
 PA for Trulicity  completed on CoverMyMeds and Approved.  CaseId:[id number];Status:Approved;Review Type:Prior Auth;Coverage Start Date:08/17/2023;Coverage End Date:09/15/2024;

## 2023-09-16 NOTE — Progress Notes (Signed)
 PROGRESS NOTE   Subjective/Complaints: No events overnight.  No acute complaints.  Looking forward to discharge today, no questions or concerns.  Does want extra paper prescription for Brilinta  given he is returning to [place] in 2-1/2 weeks I will need some help transitioning this new medication there.  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  Vitals stable     09/16/2023    5:30 AM 09/15/2023    9:05 PM 09/15/2023    1:30 PM  Vitals with BMI  Systolic 129 104 886  Diastolic 75 74 73  Pulse 61 63 70    Recent Labs    09/15/23 1657 09/15/23 2103 09/16/23 0621  GLUCAP 173* 163* 122*     P.o. intakes appropriate  Continent of bladder  Last BM 7/3  ROS: Patient denies fever, chills, new vision changes, dizziness, nausea, vomiting, diarrhea,  constipation, shortness of breath or chest pain, headache, or mood change.  Objective:   No results found. Recent Labs    09/16/23 0600  WBC 7.7  HGB 16.3  HCT 47.0  PLT 213    Recent Labs    09/16/23 0600  NA 138  K 3.8  CL 101  CO2 26  GLUCOSE 129*  BUN 21*  CREATININE 1.02  CALCIUM  9.1    Intake/Output Summary (Last 24 hours) at 09/16/2023 0747 Last data filed at 09/15/2023 1900 Gross per 24 hour  Intake 720 ml  Output --  Net 720 ml        Physical Exam: Vital Signs Blood pressure 129/75, pulse 61, temperature (!) 97.4 F (36.3 C), resp. rate 17, height 5' 3 (1.6 m), weight 83.9 kg, SpO2 99%.  General: No apparent distress, sitting up at bedside. HEENT: [place],AT, MMM Neck: Supple without JVD or lymphadenopathy Heart: Reg rate and rhythm. No murmurs rubs or gallops, cardiac monitor L chest Chest: CTA bilaterally without wheezes, rales, or rhonchi; no distress Abdomen: Soft, non-tender, non-distended, bowel sounds positive. Extremities: No clubbing,  cyanosis, or edema. Pulses are 2+ Psych: Pt's affect is appropriate. Pt is cooperative. Pleasant  Skin: Clean and intact without signs of breakdown Neuro: Alert and oriented x 3, good insight and awareness, fluent, follows commands, mild right facial weakness    MOTOR: RUE: 5-/5 Deltoid, 4+/5 Biceps, 4+/5 Triceps,5-/5 Grip LUE: 5/5 Deltoid, 5/5 Biceps, 5/5 Triceps, 5/5 Grip RLE: HF 5-/5, KE 5-/5, ADF 5-/5, APF 5-/5 LLE: HF 5/5, KE 5/5, ADF 5/5, APF 5/5, stable 7/6   SENSORY: Normal to touch all 4 extremities   No hypertonia noted        Assessment/Plan: 1. Functional deficits which require 3+ hours per day of interdisciplinary therapy in a comprehensive inpatient rehab setting. Physiatrist is providing close team supervision and 24 hour management of active medical problems listed below. Physiatrist and rehab team continue to assess barriers to discharge/monitor patient progress toward functional and medical goals  Care Tool:  Bathing    Body parts bathed by patient: Right arm, Left arm, Chest, Abdomen, Front perineal area, Right upper leg, Buttocks, Left upper leg, Right lower leg, Left lower leg, Face  Bathing assist Assist Level: Independent with assistive device     Upper Body Dressing/Undressing Upper body dressing   What is the patient wearing?: Pull over shirt    Upper body assist Assist Level: Independent with assistive device    Lower Body Dressing/Undressing Lower body dressing      What is the patient wearing?: Underwear/pull up, Pants     Lower body assist Assist for lower body dressing: Independent with assitive device     Toileting Toileting    Toileting assist Assist for toileting: Independent with assistive device     Transfers Chair/bed transfer  Transfers assist     Chair/bed transfer assist level: Independent     Locomotion Ambulation   Ambulation assist      Assist level: Independent Assistive device: No  Device Max distance: 350+   Walk 10 feet activity   Assist     Assist level: Independent Assistive device: No Device   Walk 50 feet activity   Assist    Assist level: Independent Assistive device: No Device    Walk 150 feet activity   Assist    Assist level: Independent Assistive device: No Device    Walk 10 feet on uneven surface  activity   Assist     Assist level: Independent Assistive device: Other (comment) (no device.)   Wheelchair     Assist Is the patient using a wheelchair?: No   Wheelchair activity did not occur: N/A         Wheelchair 50 feet with 2 turns activity    Assist    Wheelchair 50 feet with 2 turns activity did not occur: N/A       Wheelchair 150 feet activity     Assist  Wheelchair 150 feet activity did not occur: N/A       Blood pressure 129/75, pulse 61, temperature (!) 97.4 F (36.3 C), resp. rate 17, height 5' 3 (1.6 m), weight 83.9 kg, SpO2 99%.  Medical Problem List and Plan: 1. Functional deficits secondary to left paramedian pontine CVA s/p TNK             -patient may  shower             -ELOS/Goals: PT, OT modified to independent, 4 to 6 days             - Continue CIR  - DC planned for Monday  -provided education to decrease stroke risk   - Will provide work notice of limitations through 8 weeks posthospitalization The patient is medically ready for discharge to home and will not need follow-up with [facility] PM[facility]. In addition, they will need to follow up with their PCP, Neurology.   2.  Antithrombotics: -DVT/anticoagulation:  Pharmaceutical: Lovenox  added- pt requested DC- discussed risks/benefits and pt wanted to DC this, ok to DC due to distance he is walking             -antiplatelet therapy: continue ASA/Brilinta  X 4 weeks followed by Brilinta  alone. --Prescription provided for 1 month of Brilinta  from the hospital, and additional paper prescription at discharge for transition to [place]  3. Pain  Management: Tylenol  prn.  4. Mood/Behavior/Sleep: LCSW to follow for evaluation and support             -antipsychotic agents: N/A 5. Neuropsych/cognition: This patient is capable of making decisions on his own behalf. 6. Skin/Wound Care: Routine pressure relief measures.  7. Fluids/Electrolytes/Nutrition: Monitor I/O. Check CMET in am 8.  HTN: Monitor  BP TID--was on Bisoprolol  and Lisinopril .  BP goal less than 180/105 -continue on low dose Bisoprolol . Has had HR improving.  -7/4 BP stable, continue to monitor     09/16/2023    5:30 AM 09/15/2023    9:05 PM 09/15/2023    1:30 PM  Vitals with BMI  Systolic 129 104 886  Diastolic 75 74 73  Pulse 61 63 70    9. T2DM: Hgb A1c-8.1. Was on tresiba  60 units, trulicity  and jardiance  at home--compliance?             --BS relatively ranging from 100-160. Will resume insulin  glargine at 10 units HS             --encourage fluid intake as on Jardiance  and BUN trending up.   Provided dietary education  Discussed inability to tolerate metformin  7-7: Blood sugars relatively well-controlled with diet and reduced long-acting insulin ; would monitor blood sugars closely at discharge and inform patient to contact PCP if normal regimen provide serious lows.  Appreciate nutrition consultation today for dietary education.   10. Pre-renal azotemia: BUN- 22. Encourage fluid intake.  Recheck labs -7/3 BUNs a little higher at 24, encourage oral fluids and monitor for now, asked nursing to enourage 7-7: BUN downtrending, creatinine stable.  11. OSA: Has been compliant with CPAP use.  Discussed benefits of CPAP  12. Dyslipidemia: Trig-155/LDL-98.  Continue Statin  13. Obesity class I: Educated on diet and exercise to promote health and weight loss.  Dietitian to see patient             Body mass index is 33.16 kg/m.  Discussed supplement berberine  14. CAD DES to LAD 07/11/20.  Continue ASA/Brilinta  as above  15. Constipation: PRN senokot, PRN sorbitol   -  7/3 improved after bowel movement today   pt to try to eat foods that help promote Bms, continue PRN laxatives  - Patient endorses unrecorded bowel movements over the weekend; no feelings of constipation at discharge.  16.  Elevated bilirubin: Recheck labs Monday--bilirubin within normal limits 11-7      LOS: 5 days A FACE TO FACE EVALUATION WAS PERFORMED  Tyler Hoffman 09/16/2023, 7:47 AM

## 2023-09-16 NOTE — Discharge Summary (Signed)
 Physician Discharge Summary  Patient ID: Tyler Hoffman MRN: [id number] DOB/AGE: [birth year] 49 y.o.  Admit date: 09/11/2023 Discharge date: 09/16/2023  Discharge Diagnoses:  Principal Problem:   Left pontine stroke [facility]) Active Problems:   Diabetes mellitus with circulatory complication, with long-term current use of insulin  (HCC)   Hyperlipidemia associated with type 2 diabetes mellitus (HCC)   OSA (obstructive sleep apnea)   Obesity (BMI 30-39.9)   Hypertension associated with diabetes (HCC)   Coronary artery disease involving native coronary artery of native heart without angina pectoris   Bradycardia   Discharged Condition: stable  Significant Diagnostic Studies: N/A   Labs:  Basic Metabolic Panel: Recent Labs  Lab 09/12/23 0514 09/13/23 0436 09/16/23 0600  NA 134* 137 138  K 3.5 4.2 3.8  CL 102 104 101  CO2 21* 25 26  GLUCOSE 132* 130* 129*  BUN 24* 23* 21*  CREATININE 0.98 1.08 1.02  CALCIUM  8.9 8.8* 9.1    CBC: Recent Labs  Lab 09/12/23 0514 09/16/23 0600  WBC 7.7 7.7  NEUTROABS 4.7  --   HGB 16.8 16.3  HCT 48.3 47.0  MCV 93.6 94.4  PLT 225 213    CBG: Recent Labs  Lab 09/15/23 1128 09/15/23 1657 09/15/23 2103 09/16/23 0621 09/16/23 1134  GLUCAP 117* 173* 163* 122* 96    Brief HPI:   Tyler Hoffman is a 48 y.o. male with history of T2DM, OSA, CAD, obesity-BMI 33 was admitted on 09/08/2023 with sudden onset of RLE and RUE weakness.  CTA was negative for LVO and patient received TNK.  MRI brain done revealing 9 mm acute infarct left paramedian pons.  Dr. Rosemarie felt that stroke was due to small vessel disease and recommended changing Plavix to Brilinta  with DAPT x 4 weeks followed by Brilinta  alone.  Hospital course significant for bradycardia with heart rates down to 35 and felt to be in setting of stroke.  2D echo done showed EF of 60 to 65% with normal LVEF and Zebeta  resumed.  2-week cardiac monitor placed on 07/01 with recommendations to follow-up with  cardiology nightly.  Therapy was consulted and patient was noted to have unsteady gait as well as dizziness with activity.  He was requiring supervision with ADLs and mobility.  Patient was independent prior to admission CIR was recommended due to functional decline.SABRA   Hospital Course: Tyler Hoffman was admitted to rehab 09/11/2023 for inpatient therapies to consist of PT  and OT at least three hours five days a week. Past admission physiatrist, therapy team and rehab RN have worked together to provide customized collaborative inpatient rehab.  He was maintained on aspirin  and Brilinta  during his stay and has been tolerating this without any side effects.  Follow-up CBC shows H&H and platelets to be stable.  Check of electrolytes revealed prerenal azotemia and mild hyponatremia.  He was encouraged to increase fluid intake with improvement in renal status and hyponatremia has resolved.  He has been educated extensively on carb modified diet as well as importance of hydration due to Jardiance .  Insulin  glargine was resumed at 10 units at bedtime with blood sugars trending upwards.  He was advised to monitor blood sugars on ACHS basis or more frequently with his CGM and increase basal insulin  by 10 units daily if blood sugars are ranging over 200.  P.o. intake has been good and he is continent of bowel and bladder.  He has made steady progress and is modified independent at discharge.  He will continue to  receive outpatient PT OT at [facility] neuro rehab after discharge.   Rehab course: During patient's stay in rehab team conferences were held to monitor patient's progress, set goals and discuss barriers to discharge. At admission, patient required supervision with ADLs and with mobility. He  has had improvement in activity tolerance, balance, postural control as well as ability to compensate for deficits.  He is able to complete ADL tasks at modified independent level.  He is independent for transfers and is able to  ambulate greater than 1000 feet without assistive device.  Discharge disposition: 01-Home or Self Care  Diet: Carb modified/heart healthy.  Special Instructions: 1.  No driving or strenuous activity till cleared by MD.  Discharge Instructions     Ambulatory referral to Neurology   Complete by: As directed    An appointment is requested in approximately: 2 [place].   Ambulatory referral to Occupational Therapy   Complete by: As directed    Eval and treat   Ambulatory referral to Physical Medicine Rehab   Complete by: As directed    Ambulatory referral to Physical Therapy   Complete by: As directed       Allergies as of 09/16/2023       Reactions   Metformin Diarrhea   Metformin And Related Diarrhea   Pseudoephedrine Hypertension   Sulfa Antibiotics Diarrhea   Septra [sulfamethoxazole-trimethoprim] Diarrhea, Swelling, Rash   Sulfamethoxazole-trimethoprim Swelling, Rash   Red all over, also   Sulfonamide Derivatives Swelling, Rash        Medication List     STOP taking these medications    clopidogrel 75 MG tablet Commonly known as: PLAVIX   ezetimibe  10 MG tablet Commonly known as: ZETIA    glucose blood test strip Commonly known as: OneTouch Verio   insulin  aspart 100 UNIT/ML injection Commonly known as: novoLOG        TAKE these medications    aspirin  EC 81 MG tablet Take 1 tablet (81 mg total) by mouth daily. Swallow whole.   bisoprolol  5 MG tablet Commonly known as: ZEBETA  Take 1/2 tablets (2.5 mg total) by mouth daily. What changed: medication strength   Pen Needles 30G X 8 MM Misc Inject 10 Units into the skin daily. Pen needles for Levemir  Flex Pen   ticagrelor  90 MG Tabs tablet Commonly known as: BRILINTA  Take 1 tablet (90 mg total) by mouth 2 (two) times daily.   Tresiba  FlexTouch 100 UNIT/ML FlexTouch Pen Generic drug: insulin  degludec Inject 10 Units into the skin at bedtime. What changed:  how much to  take when to take this   Trulicity  1.5 MG/0.5ML Soaj Generic drug: Dulaglutide  Inject 1.5 mg into the skin once a week. Notes to patient: Resume at home        Follow-up Information     Emeline Joesph BROCKS, DO Follow up.   Specialty: Physical Medicine and Rehabilitation Why: office will call you with follow up appointment Contact information: [address] [place] [address] [phone]         [facility] Follow up.   Why: office will call you with follow up appointment Contact information: [address]     [address] [place]  [address] [phone]                Signed: Sharlet GORMAN Schmitz 09/18/2023, 10:04 AM

## 2023-09-16 NOTE — Progress Notes (Signed)
 Inpatient Rehabilitation Discharge Medication Review by a Pharmacist  A complete drug regimen review was completed for this patient to identify any potential clinically significant medication issues.  High Risk Drug Classes Is patient taking? Indication by Medication  Antipsychotic No   Anticoagulant No   Antibiotic No   Opioid No   Antiplatelet Yes bASA, ticagrelor  (DAPT x 4 weeks and then ticagrelor  monotherapy-stop entered)- CVA   Hypoglycemics/insulin  Yes Empagliflozin , dulaglutide , insulin   Vasoactive Medication Yes Bisoprolol  - CAD   Chemotherapy No   Other Yes Crestor - HLD      Type of Medication Issue Identified Description of Issue Recommendation(s)  Drug Interaction(s) (clinically significant)     Duplicate Therapy     Allergy     No Medication Administration End Date     Incorrect Dose     Additional Drug Therapy Needed     Significant med changes from prior encounter (inform family/care partners about these prior to discharge). Change- clopidogrel>>ticagrelor    PTA meds not resumed- lisinopril  (pt was not taking), tadalafil  Communicate relevant medication changes to patient/family members at discharge from CIR.     Other       Clinically significant medication issues were identified that warrant physician communication and completion of prescribed/recommended actions by midnight of the next day:  No  Name of provider notified for urgent issues identified:   Provider Method of Notification:     Pharmacist comments:  Time spent performing this drug regimen review (minutes):  20  Sergio Batch, PharmD, [place], AAHIVP, CPP Infectious Disease Pharmacist 09/16/2023 7:19 AM

## 2023-09-18 ENCOUNTER — Encounter: Payer: Self-pay | Admitting: Family Medicine

## 2023-09-18 ENCOUNTER — Encounter: Payer: Self-pay | Admitting: Occupational Therapy

## 2023-09-18 ENCOUNTER — Ambulatory Visit (INDEPENDENT_AMBULATORY_CARE_PROVIDER_SITE_OTHER): Admitting: Family Medicine

## 2023-09-18 ENCOUNTER — Ambulatory Visit: Attending: Physical Medicine and Rehabilitation | Admitting: Physical Therapy

## 2023-09-18 ENCOUNTER — Telehealth: Payer: Self-pay | Admitting: Neurology

## 2023-09-18 ENCOUNTER — Ambulatory Visit: Admitting: Occupational Therapy

## 2023-09-18 VITALS — BP 134/74 | HR 78 | Temp 98.7°F | Ht 64.37 in | Wt 187.0 lb

## 2023-09-18 DIAGNOSIS — I152 Hypertension secondary to endocrine disorders: Secondary | ICD-10-CM

## 2023-09-18 DIAGNOSIS — R2681 Unsteadiness on feet: Secondary | ICD-10-CM | POA: Insufficient documentation

## 2023-09-18 DIAGNOSIS — M6281 Muscle weakness (generalized): Secondary | ICD-10-CM | POA: Diagnosis present

## 2023-09-18 DIAGNOSIS — E1159 Type 2 diabetes mellitus with other circulatory complications: Secondary | ICD-10-CM

## 2023-09-18 DIAGNOSIS — I639 Cerebral infarction, unspecified: Secondary | ICD-10-CM | POA: Diagnosis present

## 2023-09-18 DIAGNOSIS — R29818 Other symptoms and signs involving the nervous system: Secondary | ICD-10-CM | POA: Diagnosis present

## 2023-09-18 DIAGNOSIS — Z794 Long term (current) use of insulin: Secondary | ICD-10-CM

## 2023-09-18 DIAGNOSIS — E1169 Type 2 diabetes mellitus with other specified complication: Secondary | ICD-10-CM

## 2023-09-18 DIAGNOSIS — R001 Bradycardia, unspecified: Secondary | ICD-10-CM | POA: Diagnosis not present

## 2023-09-18 DIAGNOSIS — I2510 Atherosclerotic heart disease of native coronary artery without angina pectoris: Secondary | ICD-10-CM | POA: Diagnosis not present

## 2023-09-18 DIAGNOSIS — R278 Other lack of coordination: Secondary | ICD-10-CM | POA: Insufficient documentation

## 2023-09-18 DIAGNOSIS — R2689 Other abnormalities of gait and mobility: Secondary | ICD-10-CM | POA: Diagnosis present

## 2023-09-18 DIAGNOSIS — E669 Obesity, unspecified: Secondary | ICD-10-CM

## 2023-09-18 DIAGNOSIS — E785 Hyperlipidemia, unspecified: Secondary | ICD-10-CM

## 2023-09-18 DIAGNOSIS — Z1211 Encounter for screening for malignant neoplasm of colon: Secondary | ICD-10-CM

## 2023-09-18 LAB — HM DIABETES FOOT EXAM

## 2023-09-18 MED ORDER — TRESIBA FLEXTOUCH 100 UNIT/ML ~~LOC~~ SOPN: 40.0000 [IU] | PEN_INJECTOR | Freq: Every day | SUBCUTANEOUS | Status: AC

## 2023-09-18 MED ORDER — ROSUVASTATIN CALCIUM 40 MG PO TABS: 40.0000 mg | ORAL_TABLET | Freq: Every day | ORAL | 0 refills | Status: AC

## 2023-09-18 MED ORDER — EMPAGLIFLOZIN 25 MG PO TABS: 25.0000 mg | ORAL_TABLET | Freq: Every day | ORAL | 0 refills | Status: AC

## 2023-09-18 MED ORDER — LISINOPRIL 20 MG PO TABS: 20.0000 mg | ORAL_TABLET | Freq: Every day | ORAL | 0 refills | Status: DC

## 2023-09-18 NOTE — Assessment & Plan Note (Signed)
 Chronic, acute worsening in the last several months. Per patient he has not been following a diabetic diet and has minimal exercise on a routine basis. He is now back on  Tresiba  40 units daily Jardiance  25 mg daily Trulicity  1.5 mg weekly  Uses continue glucose monitor. He plans to schedule follow-up with endocrinology when he gets back to [place].

## 2023-09-18 NOTE — Assessment & Plan Note (Signed)
 Pending ZIO monitor.

## 2023-09-18 NOTE — Telephone Encounter (Signed)
 Spoke w/Pt regarding concerns of feeling pressure in the top of his head and behind his eyes. It occurred during PT today but it has happened at other times as well. Pt states BP and BS have been in good ranges. Also he had OV with PCP today and neuro checks were WNL. Pt stated that when he arrived home today he napped and the pressure lessened. Pt doesn't know if this is from fatigue or if he should be concerned since having the stroke. Explained about not being seen in office yet and therefore Pt may be referred back to PCP, although Dr. Rosemarie did see Pt in hospital. Will make Pt aware of MD's response. Pt voiced understanding and thanks for the call back.

## 2023-09-18 NOTE — Therapy (Signed)
 OUTPATIENT PHYSICAL THERAPY NEURO EVALUATION   Patient Name: Tyler Hoffman MRN: [id number] DOB:1975-10-17, 48 y.o., male Today's Date: 09/18/2023   PCP: Avelina Greig BRAVO, MD REFERRING PROVIDER: Maurice Sharlet RAMAN, PA-C  END OF SESSION:  PT End of Session - 09/18/23 0940     Visit Number 1    Number of Visits 5   with eval   Date for PT Re-Evaluation 10/16/23    Authorization Type [employer]    PT Start Time 0940   pt arrived late   PT Stop Time 1014    PT Time Calculation (min) 34 min    Equipment Utilized During Treatment Gait belt    Activity Tolerance Patient tolerated treatment well    Behavior During Therapy WFL for tasks assessed/performed          Past Medical History:  Diagnosis Date   CAD S/P DES PCI-LAD (08/2020 - in [place]) 07/19/2020   CTA 07/11/2020 (performed in [place]): In response to abnormal functional study. Coronary Calcium  Score 0.3.  LAD and circumflex have separate ostia.  Appears to be occlusion of the LAD after SP1.  LCx gives off 1 major marginal branch with no significant disease.  RCA has diffuse atheroma with mod stenosis prior to Crux.  LVEF Nl Systolic Fxn. ->  Cath = DES PCI prox LAD SubTO, FFR Neg RCA.   Essential hypertension 05/22/2016   History of rosacea    Hyperlipidemia associated with type 2 diabetes mellitus (HCC) 08/22/2006   Qualifier: Diagnosis of  By: Baird FNP, Frieda Kipper    Lacunar infarction (HCC) 05/2016   No residual infarct   Obesity (BMI 30-39.9) 10/16/2013   OSA on CPAP    Uses CPAP   Type II diabetes mellitus with complication (HCC)    Lacuna CVA &  CAD- PCI LAD   Past Surgical History:  Procedure Laterality Date   CORONARY STENT INTERVENTION  08/31/2020   [place], [place]: DES PCI LAD (subtotal occlusion ~ 7 mm, downstream of SP1); - Unknowns STENT Size/ Brand..   CT CTA CORONARY W/CA SCORE W/CM &/OR WO/CM  07/11/2020   Performed in [place] in response to abnormal functional study: o Coronary Calcium  Score 0.3.  LAD and  circumflex have separate ostia.  Appears to be occlusion of the LAD after SP1.  LCx gives off 1 major marginal branch with no significant disease.  RCA has diffuse atheroma with moderate stenosis prior to the crux.  LVEF normal systolic function. ->  Coronary angiography recommended.   LEFT HEART CATH AND CORONARY ANGIOGRAPHY  08/31/2020   [place], [place]): Two-vessel CAD with subtotal occlusion just distal to SP1.  Subcritical stenosis of the right coronary artery (IFR and FFR negative). => LAD PCI performed with good result.  (Right radial access)   nasoplasty  08/11/2003   TRANSTHORACIC ECHOCARDIOGRAM  06/07/2016   With bubble study for CVA evaluation: ([place]) normal LV function.  EF 66 5%.  No RWMA.  GR 1 DD.  No IAS, PFO or ASD noted.  No R-L shunt.  Normal valves.   TRANSTHORACIC ECHOCARDIOGRAM  12/2018   [place], [place]:ormal biventricular function.  EF 65%.  Normal wall motion.  No significant valvular disease.  No pleural effusion.  Normal aorta within the level of portable section.   TRANSTHORACIC ECHOCARDIOGRAM  08/2020   [place], [place]: Normal EF. evidence of hypertensive heart disease with normal biventricular function.  Normal aortic.  Normal valves.   Patient Active Problem List   Diagnosis Date Noted   Left  pontine stroke (HCC) 09/11/2023   Bradycardia 09/10/2023   Acute ischemic stroke (HCC) 09/08/2023   Coronary artery disease involving native coronary artery of native heart without angina pectoris 07/19/2020   Lacunar infarction (HCC) 04/27/2016   Hypertension associated with diabetes (HCC) 04/29/2014   Family history of premature CAD 04/29/2014   Obesity (BMI 30-39.9) 10/16/2013   OSA (obstructive sleep apnea) 11/05/2011   Diabetes mellitus with circulatory complication, with long-term current use of insulin  (HCC) 04/27/2009   CONJUNCTIVITIS, ALLERGIC 06/04/2008   Hyperlipidemia associated with type 2 diabetes mellitus (HCC) 08/22/2006    ONSET DATE: 09/13/2023  (referral date)  REFERRING DIAG: I63.9 (ICD-10-CM) - Acute ischemic stroke (HCC)  THERAPY DIAG:  Muscle weakness (generalized)  Other abnormalities of gait and mobility  Unsteadiness on feet  Rationale for Evaluation and Treatment: Rehabilitation  SUBJECTIVE:                                                                                                                                                                                             SUBJECTIVE STATEMENT:  Pt reports that ever since his stroke things get better every day. He reports that the hardest thing for him is going down stairs, still feels like he needs to hold onto a handrail and doesn't feel as steady. He is also interested in seeing if he would be safe to use an elliptical. A week ago while still on inpatient rehab he needed a walker just to get the bathroom, not currently using any device. He also feels like his fine motor control has improved. When he is fatigued he does have to think about his movements a little bit more.  Prior to his stroke he was a very fast walker, feels like he is not able to walk as fast now and doesn't feel safe walking at his normal speed. He does feel like his gait is normal though. He went to the grocery store the other day and it was taxing but overall the stimulus level was tolerable.  Pt feeling some pressure in his head that started a few days ago, not a headache but similar to sinus pressure. Pt denies any visual changes since his stroke.  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 Pt is going to see if he can work half days from home until early August. Pt did not exercise prior to his stroke, would get 10-12,000 steps/day walking around the school.  Pt accompanied by: self, not driving yet  PERTINENT HISTORY: PMH: CAD, lacunar infarcts on plavix, CAD, HTN, DM2  PAIN:  Are  you having pain? No  PRECAUTIONS: Other: loop recorder x 2  weeks  RED FLAGS: None   WEIGHT BEARING RESTRICTIONS: No  FALLS: Has patient fallen in last 6 months? No  LIVING ENVIRONMENT: Lives with: lives with their family Lives in: House/apartment Stairs: Yes: Internal: 12 x 3 steps; on right going up Has following equipment at home: None  PLOF: Independent  PATIENT GOALS: to be able to walk down steps better, be confident knowing I can get on exercise machines - elliptical  OBJECTIVE:  Note: Objective measures were completed at Evaluation unless otherwise noted.  DIAGNOSTIC FINDINGS:   Brain MRI 09/09/2023 IMPRESSION: 9 mm focus of acute infarct in the left paramedian pons.   Mild chronic microvascular ischemic changes.   Remote lacunar infarcts in the bilateral basal ganglia.  COGNITION: Overall cognitive status: Within functional limits for tasks assessed   SENSATION: No N/T in hands or feet Light touch WFL  COORDINATION: Slightly slower on R side  EDEMA:  None  POSTURE: No Significant postural limitations  LOWER EXTREMITY ROM:   WFL  Active  Right Eval Left Eval  Hip flexion    Hip extension    Hip abduction    Hip adduction    Hip internal rotation    Hip external rotation    Knee flexion    Knee extension    Ankle dorsiflexion    Ankle plantarflexion    Ankle inversion    Ankle eversion     (Blank rows = not tested)  LOWER EXTREMITY MMT:    MMT Right Eval Left Eval  Hip flexion 5 5  Hip extension    Hip abduction    Hip adduction    Hip internal rotation    Hip external rotation    Knee flexion 5 5  Knee extension 5 5  Ankle dorsiflexion 5 5  Ankle plantarflexion    Ankle inversion    Ankle eversion    (Blank rows = not tested)  BED MOBILITY:  Independent per pt report  TRANSFERS: Sit to stand: Complete Independence  Assistive device utilized: None     Stand to sit: Complete Independence  Assistive device utilized: None     Chair to chair: Complete Independence  Assistive  device utilized: None       RAMP:  Not tested  CURB:  Not tested  STAIRS:  Level of Assistance: Modified independence Stair Negotiation Technique: Alternating Pattern  with Single Rail on Left Number of Stairs: 4  Height of Stairs: 6  Comments: decreased confidence when descending stairs  GAIT: Findings:  Gait pattern: WFL Distance walked: various clinical distances Assistive device utilized: None Level of assistance: Modified independence Comments: no overt gait impairments   FUNCTIONAL TESTS:    [facility] PT Assessment - 09/18/23 1002       Ambulation/Gait   Gait velocity 32.8 ft over 7.34 sec = 4.47 ft/sec      Standardized Balance Assessment   Standardized Balance Assessment Timed Up and Go Test;Five Times Sit to Stand    Five times sit to stand comments  21 sec   no UE     Timed Up and Go Test   TUG Normal TUG    Normal TUG (seconds) 6.88   no AD     Functional Gait  Assessment   Gait assessed  Yes    Gait Level Surface Walks 20 ft in less than 5.5 sec, no assistive devices, good speed, no evidence for imbalance, normal gait pattern,  deviates no more than 6 in outside of the 12 in walkway width.    Change in Gait Speed Able to smoothly change walking speed without loss of balance or gait deviation. Deviate no more than 6 in outside of the 12 in walkway width.    Gait with Horizontal Head Turns Performs head turns smoothly with slight change in gait velocity (eg, minor disruption to smooth gait path), deviates 6-10 in outside 12 in walkway width, or uses an assistive device.    Gait with Vertical Head Turns Performs task with slight change in gait velocity (eg, minor disruption to smooth gait path), deviates 6 - 10 in outside 12 in walkway width or uses assistive device    Gait and Pivot Turn Pivot turns safely within 3 sec and stops quickly with no loss of balance.    Step Over Obstacle Is able to step over one shoe box (4.5 in total height) without changing gait  speed. No evidence of imbalance.    Gait with Narrow Base of Support Is able to ambulate for 10 steps heel to toe with no staggering.    Gait with Eyes Closed Walks 20 ft, uses assistive device, slower speed, mild gait deviations, deviates 6-10 in outside 12 in walkway width. Ambulates 20 ft in less than 9 sec but greater than 7 sec.    Ambulating Backwards Walks 20 ft, uses assistive device, slower speed, mild gait deviations, deviates 6-10 in outside 12 in walkway width.    Steps Alternating feet, must use rail.    Total Score 24    FGA comment: 24/30                                                                                                                                    TREATMENT DATE: PT Evaluation    PATIENT EDUCATION: Education details: Eval findings, PT POC, results of OM and functional implications Person educated: Patient Education method: Explanation and Demonstration Education comprehension: verbalized understanding, returned demonstration, and needs further education  HOME EXERCISE PROGRAM: To be initiated  GOALS: Goals reviewed with patient? Yes  SHORT TERM GOALS=LONG TERM GOALS due to length of POC  LONG TERM GOALS: Target date: 10/16/2023   Pt will be independent with final HEP for improved strength, balance, transfers and gait. Baseline:  Goal status: INITIAL  2.  Pt will improve FGA to 28/30 for decreased fall risk  Baseline: 24/30 (7/9) Goal status: INITIAL  3.  Pt will feel comfortable navigating up/down a flight of stairs with LRAD with no imbalance noted. Baseline: mod I level with mild instability (7/9) Goal status: INITIAL  4.  Pt will trial community-level exercise equipment to determine what would be safe to him to continue to use upon d/c from OPPT. Baseline:  Goal status: INITIAL    ASSESSMENT:  CLINICAL IMPRESSION: Patient is a 48 year old male referred to Neuro OPPT for acute ischemic stroke.  Pt's PMH is significant for: CAD,  lacunar infarcts on plavix, CAD, HTN, DM2. The following deficits were present during the exam: mild higher level balance impairments and mildly impaired coordination RLE. Pt would benefit from skilled PT to address these impairments and functional limitations to maximize functional mobility independence.   OBJECTIVE IMPAIRMENTS: Abnormal gait, decreased balance, and decreased coordination.   ACTIVITY LIMITATIONS: stairs and elliptical  PARTICIPATION LIMITATIONS: driving, community activity, and occupation  PERSONAL FACTORS: Fitness and 3+ comorbidities:   CAD, lacunar infarcts on plavix, CAD, HTN, DM2are also affecting patient's functional outcome.   REHAB POTENTIAL: Excellent  CLINICAL DECISION MAKING: Stable/uncomplicated  EVALUATION COMPLEXITY: Low  PLAN:  PT FREQUENCY: 4 visits  PT DURATION: 4  sessions  PLANNED INTERVENTIONS: [id number]- PT Re-evaluation, 97750- Physical Performance Testing, 97110-Therapeutic exercises, 97530- Therapeutic activity, [id number]- Neuromuscular re-education, 97535- Self Care, [id number]- Manual therapy, [phone]- Gait training, [phone]- Electrical stimulation (manual), [phone] (1-2 muscles), [id number] (3+ muscles)- Dry Needling, Patient/Family education, Balance training, Stair training, Taping, Joint mobilization, Spinal mobilization, Vestibular training, Visual/preceptual remediation/compensation, DME instructions, Cryotherapy, and Moist heat  PLAN FOR NEXT SESSION: assess BP, trial elliptical, higher level balance, stairs, 8 step up/downs, FGA impairments (see sticky)   Waddell Southgate, PT Waddell Southgate, PT, DPT, CSRS  09/18/2023, 10:15 AM

## 2023-09-18 NOTE — Therapy (Signed)
 OUTPATIENT OCCUPATIONAL THERAPY NEURO EVALUATION  Patient Name: Tyler Hoffman MRN: [id number] DOB:03-Aug-1975, 48 y.o., male Today's Date: 09/18/2023  PCP: Avelina Greig BRAVO, MD  REFERRING PROVIDER: Maurice Sharlet RAMAN, PA-C  END OF SESSION:  OT End of Session - 09/18/23 1020     Visit Number 1    Number of Visits 2    Date for OT Re-Evaluation 09/27/23    Authorization Type [employer]    OT Start Time 1018    OT Stop Time 1103    OT Time Calculation (min) 45 min    Activity Tolerance Patient tolerated treatment well    Behavior During Therapy WFL for tasks assessed/performed          Past Medical History:  Diagnosis Date   CAD S/P DES PCI-LAD (08/2020 - in [place]) 07/19/2020   CTA 07/11/2020 (performed in [place]): In response to abnormal functional study. Coronary Calcium  Score 0.3.  LAD and circumflex have separate ostia.  Appears to be occlusion of the LAD after SP1.  LCx gives off 1 major marginal branch with no significant disease.  RCA has diffuse atheroma with mod stenosis prior to Crux.  LVEF Nl Systolic Fxn. ->  Cath = DES PCI prox LAD SubTO, FFR Neg RCA.   Essential hypertension 05/22/2016   History of rosacea    Hyperlipidemia associated with type 2 diabetes mellitus (HCC) 08/22/2006   Qualifier: Diagnosis of  By: Baird FNP, Frieda Kipper    Lacunar infarction (HCC) 05/2016   No residual infarct   Obesity (BMI 30-39.9) 10/16/2013   OSA on CPAP    Uses CPAP   Type II diabetes mellitus with complication (HCC)    [place] CVA &  CAD- PCI LAD   Past Surgical History:  Procedure Laterality Date   CORONARY STENT INTERVENTION  08/31/2020   [place], [place]: DES PCI LAD (subtotal occlusion ~ 7 mm, downstream of SP1); - Unknowns STENT Size/ Brand..   CT CTA CORONARY W/CA SCORE W/CM &/OR WO/CM  07/11/2020   Performed in [place] in response to abnormal functional study: o Coronary Calcium  Score 0.3.  LAD and circumflex have separate ostia.  Appears to be occlusion of the LAD after SP1.  LCx  gives off 1 major marginal branch with no significant disease.  RCA has diffuse atheroma with moderate stenosis prior to the crux.  LVEF normal systolic function. ->  Coronary angiography recommended.   LEFT HEART CATH AND CORONARY ANGIOGRAPHY  08/31/2020   [place], [place]): Two-vessel CAD with subtotal occlusion just distal to SP1.  Subcritical stenosis of the right coronary artery (IFR and FFR negative). => LAD PCI performed with good result.  (Right radial access)   nasoplasty  08/11/2003   TRANSTHORACIC ECHOCARDIOGRAM  06/07/2016   With bubble study for CVA evaluation: ([place]) normal LV function.  EF 66 5%.  No RWMA.  GR 1 DD.  No IAS, PFO or ASD noted.  No R-L shunt.  Normal valves.   TRANSTHORACIC ECHOCARDIOGRAM  12/2018   [place], [place]:ormal biventricular function.  EF 65%.  Normal wall motion.  No significant valvular disease.  No pleural effusion.  Normal aorta within the level of portable section.   TRANSTHORACIC ECHOCARDIOGRAM  08/2020   [place], [place]: Normal EF. evidence of hypertensive heart disease with normal biventricular function.  Normal aortic.  Normal valves.   Patient Active Problem List   Diagnosis Date Noted   Left pontine stroke (HCC) 09/11/2023   Bradycardia 09/10/2023   Acute ischemic stroke (HCC) 09/08/2023  Coronary artery disease involving native coronary artery of native heart without angina pectoris 07/19/2020   Lacunar infarction (HCC) 04/27/2016   Hypertension associated with diabetes (HCC) 04/29/2014   Family history of premature CAD 04/29/2014   Obesity (BMI 30-39.9) 10/16/2013   OSA (obstructive sleep apnea) 11/05/2011   Diabetes mellitus with circulatory complication, with long-term current use of insulin  (HCC) 04/27/2009   CONJUNCTIVITIS, ALLERGIC 06/04/2008   Hyperlipidemia associated with type 2 diabetes mellitus (HCC) 08/22/2006    ONSET DATE: 09/13/2023 (referral date)   REFERRING DIAG: I63.9 (ICD-10-CM) - Acute ischemic stroke (HCC)    THERAPY DIAG:  Muscle weakness (generalized)  Acute ischemic stroke (HCC)  Type 2 diabetes mellitus with other circulatory complication, without long-term current use of insulin  (HCC)  Rationale for Evaluation and Treatment: Rehabilitation  SUBJECTIVE:   SUBJECTIVE STATEMENT: 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 Pt is going to see if he can work half days from home until early August. Pt did not exercise prior to his stroke, would get 10-12,000 steps/day walking around the school.   He has tried writing, with difficulty (wife is a Manufacturing systems engineer and has been encouraging him), but has not tried typing. Gradually, he feels he is getting better and desires to get back to PLOF. Currently he feels most limited by his balance. He is getting adequate sleep at night. He has red putty from the hospital. Denies cognitive changes.  Pt accompanied by: self  PERTINENT HISTORY: PMH: CAD, lacunar infarcts on plavix, CAD, HTN, DM2   PRECAUTIONS: Other: loop recorder x 2 weeks   WEIGHT BEARING RESTRICTIONS: No  PAIN:  Are you having pain? No  FALLS: Has patient fallen in last 6 months? No  LIVING ENVIRONMENT: Lives with: lives with their family Lives in: House/apartment Normal home - 3 floors (worked on carrying laundry up/down stairs with handrail) Has following equipment at home: None  PLOF: Independent; driving; working; plays piano  PATIENT GOALS: return to prior level of function  OBJECTIVE:  Note: Objective measures were completed at Evaluation unless otherwise noted.  HAND DOMINANCE: Right  ADLs and IADLS: Overall: mostly mod I (increased time)  MOBILITY STATUS: Independent, reports feeling off-balance  ACTIVITY TOLERANCE: Activity tolerance: good to fair  UPPER EXTREMITY ROM:    BUE WNL  HAND FUNCTION: Grip strength: Right: 68.1 lbs; Left: 68.3 lbs  COORDINATION: 9 Hole Peg test: Right: 24 sec; Left: 22  sec  SENSATION: WFL  EDEMA: none reported or observed  MUSCLE TONE: WFL  COGNITION: Overall cognitive status: Within functional limits for tasks assessed  VISION: Subjective report: no change; has new glasses and needs to get them adjusted Baseline vision: Wears glasses all the time and progressive lenses Visual history: none  VISION ASSESSMENT: WFL  PERCEPTION: WFL  PRAXIS: WFL  OBSERVATIONS: Pt ambulates without use of AD. No loss of balance. The pt appears well kept and has glasses donned.  TREATMENT :     OT educated pt on rehabilitation process and results of objective measures in relation to pt specific goals.    OT initiated green putty and coordination HEPs as noted in pt instructions for improved functional use of RUE.     PATIENT EDUCATION: Education details: OT role and POC; RUE coordination and putty HEPs Person educated: Patient Education method: Explanation, Demonstration, Verbal cues, and Handouts Education comprehension: verbalized understanding, returned demonstration, verbal cues required, and needs further education  HOME EXERCISE PROGRAM: 09/18/2023: RUE coordination and green putty HEPs   GOALS:  LONG TERM GOALS: Target date: 09/27/2023  Pt to demonstrate good understanding of RUE HEP for improved coordination and strength.  Baseline:  Goal status: IN PROGRESS  2.  Pt will report improved abilities using RUE to play piano.  Baseline:  Goal status: INITIAL  ASSESSMENT:  CLINICAL IMPRESSION: Patient is a 48 y.o. male who was seen today for occupational therapy evaluation for CVA. Hx includes CAD, lacunar infarcts on plavix, CAD, HTN, DM2. Patient currently presents slightly below baseline level of functioning demonstrating functional deficits and impairments as noted below. Pt would benefit from skilled OT services in the  outpatient setting to work on impairments as noted below to help pt return to PLOF as able.    PERFORMANCE DEFICITS: in functional skills including ADLs, IADLs, coordination, dexterity, strength, Fine motor control, mobility, balance, endurance, decreased knowledge of precautions, and UE functional use.   IMPAIRMENTS: are limiting patient from ADLs, IADLs, work, leisure, and social participation.   CO-MORBIDITIES: may have co-morbidities  that affects occupational performance. Patient will benefit from skilled OT to address above impairments and improve overall function.  MODIFICATION OR ASSISTANCE TO COMPLETE EVALUATION: Min-Moderate modification of tasks or assist with assess necessary to complete an evaluation.  OT OCCUPATIONAL PROFILE AND HISTORY: Detailed assessment: Review of records and additional review of physical, cognitive, psychosocial history related to current functional performance.  CLINICAL DECISION MAKING: Moderate - several treatment options, min-mod task modification necessary  REHAB POTENTIAL: Good  EVALUATION COMPLEXITY: Moderate    PLAN:  OT FREQUENCY: 1x/week  OT DURATION: 1 additional session  PLANNED INTERVENTIONS: 97168 OT Re-evaluation, 97535 self care/ADL training, 02889 therapeutic exercise, 97530 therapeutic activity, 97112 neuromuscular re-education, patient/family education, and DME and/or AE instructions  RECOMMENDED OTHER SERVICES: N/A for this visit  CONSULTED AND AGREED WITH PLAN OF CARE: Patient  PLAN FOR NEXT SESSION: Pt is leaving to go back home on 7/19 - will likely be ready for d/c; 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   Jocelyn CHRISTELLA Bottom, OT 09/18/2023, 12:39 PM

## 2023-09-18 NOTE — Assessment & Plan Note (Signed)
 Chronic, plans to follow-up with cardiology in [place].

## 2023-09-18 NOTE — Assessment & Plan Note (Signed)
 Encouraged exercise, weight loss, healthy eating habits.  Trulicity  1.5 mg weekly

## 2023-09-18 NOTE — Progress Notes (Signed)
 Patient ID: Tyler Hoffman, male    DOB: 07/24/1975, 48 y.o.   MRN: [id number]  This visit was conducted in person.  BP 134/74   Pulse 78   Temp 98.7 F (37.1 C) (Oral)   Ht 5' 4.37 (1.635 m)   Wt 187 lb (84.8 kg)   SpO2 99%   BMI 31.73 kg/m    CC:  Chief Complaint  Patient presents with   Hospitalization Follow-up    Subjective:   HPI: Tyler Hoffman is a 48 y.o. 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  Recent hospitalization for CVA  Summary as follows Admitted on 09/08/23 with sudden onset of RLE and RUE weakness. CTA negative for LVO with advanced intracranial atherosclerosis. CT head negative for bleed and patient received TNK. MRI brain done revealing 9 mm acute infarct in left paramedian pons. Dr. Rosemarie felt that stroke due to small vessel disease and recommended changing Plavix to Brilinta  with DAPT X 4 week followed by Brilinta  alone.  Patient had had bradycardia with HR in 30's and cardiology consulted for input as patient also reported DOE but no CP with three flights of stairs. 2D echo showed EF 60-65% with normal LVEF. No evidence of PAF and bradycardia felt to be in setting of stroke. Zebeta  resumed and 2 week cardiac monitor placed on 07/01 with plans to review on outpatient basis after completion.   Transferred to rehab on 7/2  Discharged on 7/7   DM, poorly controlled, due to poor diet and lifestyle in the last 9 months. Prior to hospitalization he was taking Tresiba  60 units daily, Jardiance  25 mg daily and dulaglutide  1.5 mg weekly He is now increased from the 10 units at discharge to 30 units with good control of blood sugars per continuous glucose monitor.  Lab Results  Component Value Date   HGBA1C 8.1 (H) 08/30/2023   Hyperlipidemia: On rosuvastatin  40 mg daily,  Lab Results  Component Value Date   CHOL 171 09/09/2023   HDL 42 09/09/2023   LDLCALC 98 09/09/2023   LDLDIRECT  114.0 07/22/2015   TRIG 155 (H) 09/09/2023   CHOLHDL 4.1 09/09/2023    Hypertension:    Bisoprolol  and lisinopril  held during hospitalization.  Still holding lisinopril .  BP goal < 180/105 BP Readings from Last 3 Encounters:  09/18/23 134/74  09/16/23 129/75  09/11/23 113/75  Using medication without problems or lightheadedness:  Chest pain with exertion: none Edema: none Short of breath: none Average home BPs: Other issues:  Due for colon cancer screening    Has PT set up for today. Balance issues remain but are mild.  Strength in limbs normal mow. Feels some pressure on top of head at times   Relevant past medical, surgical, family and social history reviewed and updated as indicated. Interim medical history since our last visit reviewed. Allergies and medications reviewed and updated. Outpatient Medications Prior to Visit  Medication Sig Dispense Refill   aspirin  EC 81 MG tablet Take 1 tablet (81 mg total) by mouth daily. Swallow whole.     bisoprolol  (ZEBETA ) 5 MG tablet Take 1/2 tablets (2.5 mg total) by mouth daily. 30 tablet 0   Dulaglutide  (TRULICITY ) 1.5 MG/0.5ML SOAJ Inject 1.5 mg into the skin once a week. 2 mL 0   Insulin  Pen Needle (PEN NEEDLES) 30G X 8 MM MISC Inject 10 Units into the skin daily. Pen needles for Levemir  Flex Pen 100  each 11   ticagrelor  (BRILINTA ) 90 MG TABS tablet Take 1 tablet (90 mg total) by mouth 2 (two) times daily. 60 tablet 0   empagliflozin  (JARDIANCE ) 25 MG TABS tablet Take 1 tablet (25 mg total) by mouth daily.     insulin  degludec (TRESIBA  FLEXTOUCH) 100 UNIT/ML FlexTouch Pen Inject 10 Units into the skin at bedtime. (Patient taking differently: Inject 10 Units into the skin at bedtime. Patient is doing 40 units)     lisinopril  (ZESTRIL ) 20 MG tablet Take 20 mg by mouth daily.     rosuvastatin  (CRESTOR ) 40 MG tablet Take 1 tablet (40 mg total) by mouth daily. (Patient taking differently: Take 40 mg by mouth at bedtime.) 90 tablet 0   No  facility-administered medications prior to visit.     Per HPI unless specifically indicated in ROS section below Review of Systems  Constitutional:  Negative for fatigue and fever.  HENT:  Negative for ear pain.   Eyes:  Negative for pain.  Respiratory:  Negative for cough and shortness of breath.   Cardiovascular:  Negative for chest pain, palpitations and leg swelling.  Gastrointestinal:  Negative for abdominal pain.  Genitourinary:  Negative for dysuria.  Musculoskeletal:  Negative for arthralgias.  Neurological:  Negative for syncope, light-headedness and headaches.  Psychiatric/Behavioral:  Negative for dysphoric mood.    Objective:  BP 134/74   Pulse 78   Temp 98.7 F (37.1 C) (Oral)   Ht 5' 4.37 (1.635 m)   Wt 187 lb (84.8 kg)   SpO2 99%   BMI 31.73 kg/m   Wt Readings from Last 3 Encounters:  09/18/23 187 lb (84.8 kg)  09/12/23 184 lb 15.5 oz (83.9 kg)  09/10/23 187 lb 2.7 oz (84.9 kg)      Physical Exam Constitutional:      Appearance: He is well-developed.  HENT:     Head: Normocephalic.     Right Ear: Hearing normal.     Left Ear: Hearing normal.     Nose: Nose normal.  Eyes:     General: Lids are normal. Vision grossly intact. Gaze aligned appropriately.     Extraocular Movements: Extraocular movements intact.     Left eye: Normal extraocular motion.     Conjunctiva/sclera: Conjunctivae normal.     Pupils: Pupils are equal, round, and reactive to light.     Visual Fields: Right eye visual fields normal and left eye visual fields normal.  Neck:     Thyroid : No thyroid  mass or thyromegaly.     Vascular: No carotid bruit.     Trachea: Trachea normal.  Cardiovascular:     Rate and Rhythm: Normal rate and regular rhythm.     Pulses: Normal pulses.     Heart sounds: Heart sounds not distant. No murmur heard.    No friction rub. No gallop.     Comments: No peripheral edema Pulmonary:     Effort: Pulmonary effort is normal. No respiratory distress.      Breath sounds: Normal breath sounds.  Skin:    General: Skin is warm and dry.     Findings: No rash.  Neurological:     General: No focal deficit present.     Mental Status: He is alert and oriented to person, place, and time.     Cranial Nerves: Cranial nerves 2-12 are intact.     Sensory: Sensation is intact.     Motor: Motor function is intact.     Coordination: Romberg sign negative.  Coordination abnormal. Finger-Nose-Finger Test and Heel to [facility] Test normal.     Gait: Gait is intact.  Psychiatric:        Speech: Speech normal.        Behavior: Behavior normal.        Thought Content: Thought content normal.   Diabetic foot exam: Normal inspection No skin breakdown No calluses  Normal DP pulses Normal sensation to light touch and monofilament Nails normal     Results for orders placed or performed in visit on 09/18/23  HM DIABETES FOOT EXAM   Collection Time: 09/18/23 12:00 AM  Result Value Ref Range   HM Diabetic Foot Exam done     Assessment and Plan  Type 2 diabetes mellitus with other circulatory complication, with long-term current use of insulin  [facility]) Assessment & Plan: Chronic, acute worsening in the last several months. Per patient he has not been following a diabetic diet and has minimal exercise on a routine basis. He is now back on  Tresiba  40 units daily Jardiance  25 mg daily Trulicity  1.5 mg weekly  Uses continue glucose monitor. He plans to schedule follow-up with endocrinology when he gets back to [place].   Coronary artery disease involving native coronary artery of native heart without angina pectoris Assessment & Plan: Chronic, plans to follow-up with cardiology in [place].     Acute ischemic stroke [facility]) Assessment & Plan: Acute, currently planning to start physical therapy later today for mild continued balance issues.  Neuro recommendations for Brilinta  along with aspirin  (  DAPT dual antiplatelet therapy) for 4 weeks followed by Brilinta   alone long term.     Bradycardia Assessment & Plan: Pending ZIO monitor.   Hypertension associated with diabetes [facility]) Assessment & Plan: Chronic, currently allowing permissive hypertension given recent stroke. He will continue on bisoprolol  2.5 mg daily and will hold the lisinopril  unless blood pressure is trending up after the next week  to above goal 140/90.  Lisinopril  20 mg daily Bisoprolol  2.5 mg daily   Colon cancer screening -     Fecal occult blood, imunochemical; Future  Hyperlipidemia associated with type 2 diabetes mellitus (HCC) Assessment & Plan: Chronic, inadequate control, goal LDL less than 55. On rosuvastatin  40 mg daily, will return to low-cholesterol diet.  Recommended recheck in 3 months in [place]   Obesity (BMI 30-39.9) Assessment & Plan: Encouraged exercise, weight loss, healthy eating habits.  Trulicity  1.5 mg weekly   Other orders -     Empagliflozin ; Take 1 tablet (25 mg total) by mouth daily.  Dispense: 90 tablet; Refill: 0 -     Lisinopril ; Take 1 tablet (20 mg total) by mouth daily.  Dispense: 90 tablet; Refill: 0 -     Rosuvastatin  Calcium ; Take 1 tablet (40 mg total) by mouth at bedtime.  Dispense: 90 tablet; Refill: 0 -     Tresiba  FlexTouch; Inject 40 Units into the skin at bedtime.    Return if symptoms worsen or fail to improve.   Greig Ring, MD

## 2023-09-18 NOTE — Assessment & Plan Note (Signed)
 Chronic, inadequate control, goal LDL less than 55. On rosuvastatin  40 mg daily, will return to low-cholesterol diet.  Recommended recheck in 3 months in [place]

## 2023-09-18 NOTE — Assessment & Plan Note (Signed)
 Acute, currently planning to start physical therapy later today for mild continued balance issues.  Neuro recommendations for Brilinta  along with aspirin  (  DAPT dual antiplatelet therapy) for 4 weeks followed by Brilinta  alone long term.

## 2023-09-18 NOTE — Patient Instructions (Addendum)
Wrist Circles With your hand out in front of you, roll wrist clockwise 1 min and then counterclockwise 1 min  Weighted Wrist  Hold the weight (or water bottle) fully in your hand, with your thumb up, bring the weight up and down x 10 (like you are fishing) Now, turning your palm down, bring your hand up and down x10  Weighted Roll Hold the weight (or water bottle) fully in your hand with palm up, open hand slowly, allowing the weight to roll toward the tips of your fingers, and then close your fingers around it to make a fist X10  Translation Place a coin at the base of your hand and pull it to the tips of your fingers using your thumb and fingers x 10   Spider Crawls Crawl all fingers to edge of table and back x5 Crawl thumb, middle, and index fingers to edge of table and back x5  Finger Pushes Open and close all fingers and thumb to edge of table and back x5 Open and close thumb, middle, and index fingers to edge of table and back x5  Pray and Pulls Put palms of hands together and push elbows out keeping hands together Turn hands opposite one another, grasp, and then try to pull them apart Repeat whole process x10   Lid Circle Hold a lid in your hand palm up with your fingers on the outside, turn lid in your hand in a full circle clockwise and counter clockwise x5

## 2023-09-18 NOTE — Assessment & Plan Note (Signed)
 Chronic, currently allowing permissive hypertension given recent stroke. He will continue on bisoprolol  2.5 mg daily and will hold the lisinopril  unless blood pressure is trending up after the next week  to above goal 140/90.  Lisinopril  20 mg daily Bisoprolol  2.5 mg daily

## 2023-09-18 NOTE — Telephone Encounter (Signed)
 Patient came in from PT and wanted to ask about some brain pressure. Feels like pressure but feels like eye area tired and heavy. Sawn PCP she checked pupils/balance all correct. Not like a headache feeling. May be fatigue because as day goes on he gets tired. He never had a stroke so he just wanted to have you call him at this [phone]. Pt tried to send a mychart was unable to because he still hasn't had his FU here and he did see Dr. Rosemarie in the hospital and was advised to some in 3 months for a visit. PT also suggested he walk over to get some advice.

## 2023-09-19 ENCOUNTER — Ambulatory Visit: Admitting: Cardiology

## 2023-09-19 ENCOUNTER — Encounter: Payer: Self-pay | Admitting: Cardiology

## 2023-09-19 ENCOUNTER — Ambulatory Visit: Attending: Cardiology | Admitting: Cardiology

## 2023-09-19 VITALS — BP 118/70 | HR 68 | Ht 63.0 in | Wt 189.0 lb

## 2023-09-19 DIAGNOSIS — E785 Hyperlipidemia, unspecified: Secondary | ICD-10-CM

## 2023-09-19 DIAGNOSIS — I2510 Atherosclerotic heart disease of native coronary artery without angina pectoris: Secondary | ICD-10-CM

## 2023-09-19 DIAGNOSIS — I152 Hypertension secondary to endocrine disorders: Secondary | ICD-10-CM

## 2023-09-19 DIAGNOSIS — R001 Bradycardia, unspecified: Secondary | ICD-10-CM

## 2023-09-19 DIAGNOSIS — R0602 Shortness of breath: Secondary | ICD-10-CM | POA: Diagnosis not present

## 2023-09-19 DIAGNOSIS — I639 Cerebral infarction, unspecified: Secondary | ICD-10-CM

## 2023-09-19 DIAGNOSIS — E1159 Type 2 diabetes mellitus with other circulatory complications: Secondary | ICD-10-CM

## 2023-09-19 DIAGNOSIS — E1169 Type 2 diabetes mellitus with other specified complication: Secondary | ICD-10-CM

## 2023-09-19 DIAGNOSIS — G4733 Obstructive sleep apnea (adult) (pediatric): Secondary | ICD-10-CM

## 2023-09-19 DIAGNOSIS — Z794 Long term (current) use of insulin: Secondary | ICD-10-CM

## 2023-09-19 NOTE — Progress Notes (Signed)
 Cardiology Office Note:  .   Date:  09/24/2023  ID:  Tyler Hoffman, DOB March 15, 1975, MRN [id number] PCP: Avelina Greig BRAVO, MD  [facility] Providers Cardiologist:  Alm Clay, MD     Chief Complaint  Patient presents with   Follow-up    Patient was last seen in [place] in 2023. Patient was at [facility] hospital with an acute ischemic stroke.  Patient c/o shortness of breath when climbing 3 flights of stairs.  Patient is currently wearing a Zio monitor for to evaluate for A-fib.    Coronary Artery Disease    No angina, some exertional dyspnea    Patient Profile: .     Tyler Hoffman is a mildly obese 48 y.o. male with a PMH notable for Prior Lacunar Infarct with recent recurrent CVA, CAD-PCI, DM-2, HTN and HLD who presents here for [facility] CVA and to discuss exertional dyspnea.   He is referred back at the request of Avelina Greig BRAVO, MD.  PMH: CAD: DES PCI to LAD (June 2022-> PCI performed while living in [place] following Coronary CTA showing severe disease.) Cardiac Catheterization- PCI [place],  [place]): Two-vessel CAD with subtotal occlusion just distal to SP1.  Subcritical stenosis of the right coronary artery (IFR and FFR negative). => LAD PCI performed with good result.  (Right radial access) (08/31/2020) Echocardiogram with evidence of hypertensive heart disease with normal biventricular function.  Normal aortic.  Normal valves.  (June 2022) Lacunar Infarct March 2018-by report multiple lacunar infarcts seen on MRI September 08, 2023: Acute ischemic infarct in the left paramedian pontine region treated with TNKase  DM-2 HTN HLD OSA on CPAP     Orby Tangen was last seen on July 2023 (he usually comes back for the summer every other year.  He wanted to establish a cardiologist here in the [place] -partially in order to get explanations about his health in [place].  At that time he was relatively asymptomatic going somewhere between 8000-12,000 steps a day.  More walking during the  school year than in the summers.  His exertional angina symptom was chest tightness and dyspnea going up a flight of steps.  Aspirin  was stopped other medicines were continued including Plavix..   6/29 -7/2 - Stroke Admitted for left paramedian pontine infarct-treated with TNK in the ER with resolution of symptoms.  CVA confirmed by MRI.  Antihypertensives were held during the hospital (bisoprolol  5 mg and lisinopril  20 mg daily).  Discharged to rehab in [place]. => Converted from Plavix to Brilinta  90 mg twice daily.  Discussed importance of weight loss. Event monitor ordered. CIR from 7/2-09/2023   Subjective  Discussed the use of AI scribe software for clinical note transcription with the patient, who gave verbal consent to proceed.  History of Present Illness  Tyler Hoffman presents here for hospital follow-up after recent stroke.Tyler Hoffman  He primarily resides in [place], working as a principal at [employer]. He has a history of a left anterior descending (LAD) artery stent placed in June 2022 while living in [place]. Recently, he was admitted on September 08, 2023, with an acute ischemic infarct of the left paramedian pontine, treated with TNK, and was discharged on Brilinta . He was admitted to inpatient rehab on September 11, 2023, and discharged on September 16, 2023.  He has a history of diabetes, hypertension, hyperlipidemia, and obstructive sleep apnea (OSA) for which he uses CPAP. His current medications include Brilinta , Crestor , Quinapril as needed, Zepeda, Jardiance , Tresiba , and Trulicity . He is more winded than usual  when climbing stairs and is concerned about whether this was a precursor to his stroke. He typically gets 8,000 to 10,000 steps a day during the school year.  No chest pain, pressure, or discomfort, and no sensation of heart racing, skipping, or flip-flopping. No further strokes or passing out spells. He experienced a vasovagal response in the shower and another episode in the ICU where his heart  rate dropped to 30, but the rhythm was fine.  He has been on Crestor  for about four years, but his LDL was 98, which is above the target. He has made lifestyle changes, including eliminating red meat and increasing leafy greens in his diet. He wants to start walking in the mornings once he feels more stable.  He is currently wearing a monitor and plans to turn it in soon. He is scheduled to return to [place] on September 28, 2023.   Cardiovascular ROS: positive for - dyspnea on exertion and improving symptoms from previous stroke, just notes exertional fatigue and dyspnea; occasional episodes of swelling negative for - chest pain, irregular heartbeat, orthopnea, palpitations, paroxysmal nocturnal dyspnea, rapid heart rate, shortness of breath, or syncope or near syncope.  No recurrent CVA/TIA or amaurosis fugax symptoms.  No melena, hematochezia, hematuria or epistaxis..  ROS:  Review of Systems - Negative except symptoms noted in HPI    Objective   Medications - DAPT: Aspirin  81 mg a day;- Brilinta  90 mg twice daily (managed by neurology) - Crestor  40 mg daily - Quinapril (lisinopril ) 20 mg as needed (for blood pressure> 140 mg every; has not used - Zebeta  dose reduced to 2.5 mg (1/2 of 5 mg) daily - Jardiance  25 mg daily - Tresiba  40 units nightly - Trulicity  1.5 mg weekly  Social History - Employment: Magazine features editor at a [employer] - Partner Status: Married - Living Situation: Lives in [place] most of the time  Studies Reviewed: Tyler Hoffman   EKG Interpretation Date/Time:  Thursday September 19 2023 09:13:07 EDT Ventricular Rate:  68 PR Interval:  142 QRS Duration:  74 QT Interval:  396 QTC Calculation: 421 R Axis:   -9  Text Interpretation: Normal sinus rhythm Normal ECG When compared with ECG of 08-Sep-2023 17:44, No significant change was found Confirmed by Anner Lenis ([id number]) on 09/19/2023 9:37:20 AM    DIAGNOSTIC Echocardiogram: 60-65% ejection fraction, no wall motion abnormalities,  mild aortic valve calcification, no stenosis, normal valves (09/11/2023)  Lab Results  Component Value Date   CHOL 171 09/09/2023   HDL 42 09/09/2023   LDLCALC 98 09/09/2023   LDLDIRECT 114.0 07/22/2015   TRIG 155 (H) 09/09/2023   CHOLHDL 4.1 09/09/2023    Risk Assessment/Calculations:              Physical Exam:   VS:  BP 118/70 (BP Location: Left Arm, Patient Position: Sitting, Cuff Size: Normal)   Pulse 68   Ht 5' 3 (1.6 m)   Wt 189 lb (85.7 kg)   BMI 33.48 kg/m    Wt Readings from Last 3 Encounters:  09/19/23 189 lb (85.7 kg)  09/18/23 187 lb (84.8 kg)  09/12/23 184 lb 15.5 oz (83.9 kg)    GEN: Well nourished, well groomed in no acute distress; mildly obese but otherwise healthy. NECK: No JVD; No carotid bruits CARDIAC: Normal S1, S2; RRR, no murmurs, rubs, gallops RESPIRATORY:  Clear to auscultation without rales, wheezing or rhonchi ; nonlabored, good air movement. ABDOMEN: Soft, non-tender, non-distended EXTREMITIES:  No edema; No deformity  ASSESSMENT AND PLAN: .    Problem List Items Addressed This Visit       Cardiology Problems   Acute ischemic stroke (HCC) (Chronic)   Recent left paramedian pontine infarct treated with TNK. Discharged on Brilinta . No residual deficits or further strokes. - Continue DAPT (ASA 81 mg/Brilinta  90 mg twice daily) => managed by neurology - Continue rosuvastatin  referral to consider PCSK9 inhibitor or Leqvio - Allowing for mild permissive hypertension, currently on only 2.5 mg bisoprolol  with as needed lisinopril  for elevated blood pressures.   Low threshold to reinitiate low-dose ACE inhibitor. -Echocardiogram with no source identified, currently wearing event monitor to assess for arrhythmia/A-fib.  However with recurrent strokes, would have a low threshold to consider ILR      Relevant Medications   lisinopril  (ZESTRIL ) 20 MG tablet   Other Relevant Orders   EKG 12-Lead (Completed)   Coronary artery disease  involving native coronary artery of native heart without angina pectoris - Primary (Chronic)   No chest pain or pressure.  However he does note exertional dyspnea more than he had.  He does acknowledge that he has been somewhat more sedentary. Echocardiogram normal. Mild aortic valve calcification without stenosis. Now back on DAPT with ASA 81 mg daily/Brilinta  90 mg twice daily. Defer decisions on holding DAPT for procedures or surgeries to Neurology On Crestor  40 midodrine daily, but lipids not at goal ->  referral to lipid clinic to discuss PCSK9 inhibitor or Leqvio (he will check to see what is covered with insurance) Continue bisoprolol  2.5 mg daily; lisinopril  currently on hold until pressures increase.   For now would hold off on restarting lisinopril  given normal EF. Continue Jardiance  and Trulicity   - Monitor for new cardiac symptoms. - Consider follow-up with cardiologist in [place] if symptoms persist. - Consider stress test if exertional symptoms persist after recovery.      Relevant Medications   lisinopril  (ZESTRIL ) 20 MG tablet   Other Relevant Orders   EKG 12-Lead (Completed)   Diabetes mellitus with circulatory complication, with long-term current use of insulin  (HCC) (Chronic)   Managed with Jardiance , Tresiba , and Trulicity . Cardiovascular benefits noted. - Continue Jardiance , Tresiba , and Trulicity . => Close monitoring by PCP.      Relevant Medications   lisinopril  (ZESTRIL ) 20 MG tablet   Hyperlipidemia associated with type 2 diabetes mellitus (HCC) (Chronic)   LDL not at goal despite rosuvastatin .  Goal LDL <55 due to CAD and recent stroke.  Discussed PCSK9 inhibitors or Leqvio. Lifestyle changes initiated. - Consult pharmacy for insurance coverage of Repatha  or Praluent. - Consider Leqvio if PCSK9 inhibitors not covered. - Continue rosuvastatin . - Implement lifestyle changes.      Relevant Medications   lisinopril  (ZESTRIL ) 20 MG tablet   Hypertension  associated with diabetes (HCC) (Chronic)   Blood pressure well-controlled with bisoprolol  2.5 mg daily. - Continue lisinopril  20 mg as needed (SBP> 140 mmHg,, sustained for 1 hour) - Hold bisoprolol  if bradycardic symptoms occur.      Relevant Medications   lisinopril  (ZESTRIL ) 20 MG tablet     Other   Bradycardia (Chronic)   Beta-blocker dose reduced to 2.5 mg for bradycardia.  Continues to wear event monitor to evaluate for A-fib as source of occult CVA.  Low threshold to consider loop recorder.      Relevant Orders   EKG 12-Lead (Completed)   OSA (obstructive sleep apnea) (Chronic)   Continue encourage use of CPAP.  Presence of CPAP does raise question of possible  A-fib.      Other Visit Diagnoses       SOB (shortness of breath) on exertion       Relevant Orders   EKG 12-Lead (Completed)              Follow-Up: Return in about 1 year (around 09/18/2024), or Virtual Visit, for Followup with Telemedicine; 2-year follow-up in person, [facility] [employer].  Total time spent: 34 min spent with patient + 28 min spent charting = 62 min I spent 62 minutes in the care of Kevante Lunt today including reviewing labs (1 minute), reviewing studies (echocardiogram images reviewed-4 minutes), reviewing outside studies (previous studies reviewed briefly-2 minutes), face to face time discussing treatment options (34 minutes), reviewing records from recent hospitalization for CVA and last note (9 minutes), 12 minutes dictating, and documenting in the encounter.     Signed, Alm MICAEL Clay, MD, MS Alm Clay, M.D., M.S. Interventional Chartered certified accountant  Pager # [phone]

## 2023-09-19 NOTE — Patient Instructions (Signed)
 Medication Instructions:  Your physician recommends that you continue on your current medications as directed. Please refer to the Current Medication list given to you today.   *If you need a refill on your cardiac medications before your next appointment, please call your pharmacy*  Lab Work: None ordered at this time  If you have labs (blood work) drawn today and your tests are completely normal, you will receive your results only by: MyChart Message (if you have MyChart) OR A paper copy in the mail If you have any lab test that is abnormal or we need to change your treatment, we will call you to review the results.  Testing/Procedures: None ordered at this time   Follow-Up: At [facility], you and your health needs are our priority.  As part of our continuing mission to provide you with exceptional heart care, our providers are all part of one team.  This team includes your primary Cardiologist (physician) and Advanced Practice Providers or APPs (Physician Assistants and Nurse Practitioners) who all work together to provide you with the care you need, when you need it.  Your next appointment:    Virtual Visit with Dr. Anner in approximately 1 year  We recommend signing up for the patient portal called MyChart.  Sign up information is provided on this After Visit Summary.  MyChart is used to connect with patients for Virtual Visits (Telemedicine).  Patients are able to view lab/test results, encounter notes, upcoming appointments, etc.  Non-urgent messages can be sent to your provider as well.   To learn more about what you can do with MyChart, go to [email or web address].

## 2023-09-20 ENCOUNTER — Ambulatory Visit: Payer: Self-pay | Admitting: Physical Therapy

## 2023-09-20 VITALS — BP 137/75 | HR 62

## 2023-09-20 DIAGNOSIS — R2689 Other abnormalities of gait and mobility: Secondary | ICD-10-CM

## 2023-09-20 DIAGNOSIS — M6281 Muscle weakness (generalized): Secondary | ICD-10-CM

## 2023-09-20 DIAGNOSIS — R2681 Unsteadiness on feet: Secondary | ICD-10-CM

## 2023-09-20 NOTE — Therapy (Signed)
 OUTPATIENT PHYSICAL THERAPY NEURO TREATMENT   Patient Name: Tyler Hoffman MRN: [id number] DOB:11-03-1975, 48 y.o., male Today's Date: 09/20/2023   PCP: Avelina Greig BRAVO, MD REFERRING PROVIDER: Maurice Sharlet RAMAN, PA-C  END OF SESSION:  PT End of Session - 09/20/23 1446     Visit Number 2    Number of Visits 5   with eval   Date for PT Re-Evaluation 10/16/23    Authorization Type [employer]    PT Start Time 1445    PT Stop Time 1532    PT Time Calculation (min) 47 min    Equipment Utilized During Treatment Gait belt    Activity Tolerance Patient tolerated treatment well    Behavior During Therapy WFL for tasks assessed/performed           Past Medical History:  Diagnosis Date   Acute ischemic stroke (HCC)    CAD S/P DES PCI-LAD (08/2020 - in [place]) 07/19/2020   CTA 07/11/2020 (performed in [place]): In response to abnormal functional study. Coronary Calcium  Score 0.3.  LAD and circumflex have separate ostia.  Appears to be occlusion of the LAD after SP1.  LCx gives off 1 major marginal branch with no significant disease.  RCA has diffuse atheroma with mod stenosis prior to Crux.  LVEF Nl Systolic Fxn. ->  Cath = DES PCI prox LAD SubTO, FFR Neg RCA.   Essential hypertension 05/22/2016   History of rosacea    Hyperlipidemia associated with type 2 diabetes mellitus (HCC) 08/22/2006   Qualifier: Diagnosis of  By: Baird FNP, Frieda Kipper    Lacunar infarction (HCC) 05/2016   No residual infarct   Obesity (BMI 30-39.9) 10/16/2013   OSA on CPAP    Uses CPAP   Type II diabetes mellitus with complication (HCC)    [place] CVA &  CAD- PCI LAD   Past Surgical History:  Procedure Laterality Date   CORONARY STENT INTERVENTION  08/31/2020   [place], [place]: DES PCI LAD (subtotal occlusion ~ 7 mm, downstream of SP1); - Unknowns STENT Size/ Brand..   CT CTA CORONARY W/CA SCORE W/CM &/OR WO/CM  07/11/2020   Performed in [place] in response to abnormal functional study: o Coronary Calcium  Score 0.3.   LAD and circumflex have separate ostia.  Appears to be occlusion of the LAD after SP1.  LCx gives off 1 major marginal branch with no significant disease.  RCA has diffuse atheroma with moderate stenosis prior to the crux.  LVEF normal systolic function. ->  Coronary angiography recommended.   LEFT HEART CATH AND CORONARY ANGIOGRAPHY  08/31/2020   [place], [place]): Two-vessel CAD with subtotal occlusion just distal to SP1.  Subcritical stenosis of the right coronary artery (IFR and FFR negative). => LAD PCI performed with good result.  (Right radial access)   nasoplasty  08/11/2003   TRANSTHORACIC ECHOCARDIOGRAM  06/07/2016   With bubble study for CVA evaluation: ([place]) normal LV function.  EF 66 5%.  No RWMA.  GR 1 DD.  No IAS, PFO or ASD noted.  No R-L shunt.  Normal valves.   TRANSTHORACIC ECHOCARDIOGRAM  12/2018   [place], [place]:ormal biventricular function.  EF 65%.  Normal wall motion.  No significant valvular disease.  No pleural effusion.  Normal aorta within the level of portable section.   TRANSTHORACIC ECHOCARDIOGRAM  08/2020   [place], [place]: Normal EF. evidence of hypertensive heart disease with normal biventricular function.  Normal aortic.  Normal valves.   Patient Active Problem List   Diagnosis Date  Noted   Left pontine stroke (HCC) 09/11/2023   Bradycardia 09/10/2023   Acute ischemic stroke (HCC) 09/08/2023   Coronary artery disease involving native coronary artery of native heart without angina pectoris 07/19/2020   Lacunar infarction (HCC) 04/27/2016   Hypertension associated with diabetes (HCC) 04/29/2014   Family history of premature CAD 04/29/2014   Obesity (BMI 30-39.9) 10/16/2013   OSA (obstructive sleep apnea) 11/05/2011   Diabetes mellitus with circulatory complication, with long-term current use of insulin  (HCC) 04/27/2009   CONJUNCTIVITIS, ALLERGIC 06/04/2008   Hyperlipidemia associated with type 2 diabetes mellitus (HCC) 08/22/2006    ONSET DATE:  09/13/2023 (referral date)  REFERRING DIAG: I63.9 (ICD-10-CM) - Acute ischemic stroke (HCC)  THERAPY DIAG:  Muscle weakness (generalized)  Other abnormalities of gait and mobility  Unsteadiness on feet  Rationale for Evaluation and Treatment: Rehabilitation  SUBJECTIVE:                                                                                                                                                                                             SUBJECTIVE STATEMENT: Pt denies any acute changes since last visit, reports that the pressure in his head has improved.  He still gets tired, has noticed that his brain gets tired working on school work on the computer.  From Eval: Pt reports that ever since his stroke things get better every day. He reports that the hardest thing for him is going down stairs, still feels like he needs to hold onto a handrail and doesn't feel as steady. He is also interested in seeing if he would be safe to use an elliptical. A week ago while still on inpatient rehab he needed a walker just to get the bathroom, not currently using any device. He also feels like his fine motor control has improved. When he is fatigued he does have to think about his movements a little bit more.  Prior to his stroke he was a very fast walker, feels like he is not able to walk as fast now and doesn't feel safe walking at his normal speed. He does feel like his gait is normal though. He went to the grocery store the other day and it was taxing but overall the stimulus level was tolerable.  Pt feeling some pressure in his head that started a few days ago, not a headache but similar to sinus pressure. Pt denies any visual changes since his stroke.  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 Pt is going to see if he can work half days from home until  early August. Pt did not exercise prior to his stroke, would get 10-12,000  steps/day walking around the school.  Pt accompanied by: self, not driving yet  PERTINENT HISTORY: PMH: CAD, lacunar infarcts on plavix, CAD, HTN, DM2  PAIN:  Are you having pain? No  PRECAUTIONS: Other: loop recorder x 2 weeks  RED FLAGS: None   WEIGHT BEARING RESTRICTIONS: No  FALLS: Has patient fallen in last 6 months? No  LIVING ENVIRONMENT: Lives with: lives with their family Lives in: House/apartment Stairs: Yes: Internal: 12 x 3 steps; on right going up Has following equipment at home: None  PLOF: Independent  PATIENT GOALS: to be able to walk down steps better, be confident knowing I can get on exercise machines - elliptical  OBJECTIVE:  Note: Objective measures were completed at Evaluation unless otherwise noted.  DIAGNOSTIC FINDINGS:   Brain MRI 09/09/2023 IMPRESSION: 9 mm focus of acute infarct in the left paramedian pons.   Mild chronic microvascular ischemic changes.   Remote lacunar infarcts in the bilateral basal ganglia.  COGNITION: Overall cognitive status: Within functional limits for tasks assessed   SENSATION: No N/T in hands or feet Light touch WFL  COORDINATION: Slightly slower on R side  EDEMA:  None  POSTURE: No Significant postural limitations  LOWER EXTREMITY ROM:   WFL  Active  Right Eval Left Eval  Hip flexion    Hip extension    Hip abduction    Hip adduction    Hip internal rotation    Hip external rotation    Knee flexion    Knee extension    Ankle dorsiflexion    Ankle plantarflexion    Ankle inversion    Ankle eversion     (Blank rows = not tested)  LOWER EXTREMITY MMT:    MMT Right Eval Left Eval  Hip flexion 5 5  Hip extension    Hip abduction    Hip adduction    Hip internal rotation    Hip external rotation    Knee flexion 5 5  Knee extension 5 5  Ankle dorsiflexion 5 5  Ankle plantarflexion    Ankle inversion    Ankle eversion    (Blank rows = not tested)  BED MOBILITY:  Independent  per pt report  TRANSFERS: Sit to stand: Complete Independence  Assistive device utilized: None     Stand to sit: Complete Independence  Assistive device utilized: None     Chair to chair: Complete Independence  Assistive device utilized: None       RAMP:  Not tested  CURB:  Not tested  STAIRS:  Level of Assistance: Modified independence Stair Negotiation Technique: Alternating Pattern  with Single Rail on Left Number of Stairs: 4  Height of Stairs: 6  Comments: decreased confidence when descending stairs  GAIT: Findings:  Gait pattern: WFL Distance walked: various clinical distances Assistive device utilized: None Level of assistance: Modified independence Comments: no overt gait impairments   FUNCTIONAL TESTS:  TREATMENT DATE:   Self-Care/Home Management Vitals:   09/20/23 1622  BP: 137/75  Pulse: 62   Assessed in sitting in LUE at rest prior to intervention, vitals WNL.  TherEx Elliptical level 2 x 8 min (7 min forwards, 1 min backwards), RPE 6/10, heart rate gets up to 128 bpm at the most. Pt does require a seated rest break following exercise. Pt would be safe to use this gym equipment going forwards, recommended he stick with just going forwards for now.  TherAct To work on funcional LE strengthening: In // bars with progression from BUE support to no UE support: Alt L/R 8 step up/downs x 10 reps B Lateral 8 step-ups x 10 reps L/R Forwards 8 step up with alt LE march x 10 reps L/R Eccentric step downs 6 step x 10 reps, x 15 reps B In therapy gym with no UE support and CGA to close SBA for balance: Resisted gait backwards 4 x 25 ft with steady resistance (black band - hips) Resisted gait backwards 4 x 25 ft with variable resistance (black band - hips) Utilizes stepping strategy for balance recovery Resisted forwards monster  walks 4 x 25 ft with red TB around ankles Resisted lateral sidesteps x 25 ft L/R with red TB around ankles  Added to HEP, gave him green TB as well   PATIENT EDUCATION: Education details: initial HEP, elliptical recommendations Person educated: Patient Education method: Explanation and Demonstration Education comprehension: verbalized understanding, returned demonstration, and needs further education  HOME EXERCISE PROGRAM: Verbally added 09/20/23: Resisted monster walks Resisted lateral side-stepping  GOALS: Goals reviewed with patient? Yes  SHORT TERM GOALS=LONG TERM GOALS due to length of POC  LONG TERM GOALS: Target date: 10/16/2023   Pt will be independent with final HEP for improved strength, balance, transfers and gait. Baseline:  Goal status: INITIAL  2.  Pt will improve FGA to 28/30 for decreased fall risk  Baseline: 24/30 (7/9) Goal status: INITIAL  3.  Pt will feel comfortable navigating up/down a flight of stairs with LRAD with no imbalance noted. Baseline: mod I level with mild instability (7/9) Goal status: INITIAL  4.  Pt will trial community-level exercise equipment to determine what would be safe to him to continue to use upon d/c from OPPT. Baseline:  Goal status: INITIAL    ASSESSMENT:  CLINICAL IMPRESSION: Emphasis of skilled PT session on trialing use of elliptical to determine if patient would be safe with this device, working on functional strengthening with 6 and 8 step, and working on dynamic balance and strengthening with resistance. Pt most challenged by moving backwards on elliptical, 6 eccentric step-downs, and monster walks and lateral sidestepping. He continues to benefit from skilled PT services to work on improving his safety and independence with functional mobility in order to return to his PLOF. Continue POC.    OBJECTIVE IMPAIRMENTS: Abnormal gait, decreased balance, and decreased coordination.   ACTIVITY LIMITATIONS: stairs and  elliptical  PARTICIPATION LIMITATIONS: driving, community activity, and occupation  PERSONAL FACTORS: Fitness and 3+ comorbidities:   CAD, lacunar infarcts on plavix, CAD, HTN, DM2are also affecting patient's functional outcome.   REHAB POTENTIAL: Excellent  CLINICAL DECISION MAKING: Stable/uncomplicated  EVALUATION COMPLEXITY: Low  PLAN:  PT FREQUENCY: 4 visits  PT DURATION: 4  sessions  PLANNED INTERVENTIONS: [id number]- PT Re-evaluation, 97750- Physical Performance Testing, 97110-Therapeutic exercises, 97530- Therapeutic activity, [id number]- Neuromuscular re-education, 97535- Self Care, [id number]- Manual therapy, [id number]- Gait training, [id number]- Electrical stimulation (manual), [id number] (1-2 muscles), [id number] (3+  muscles)- Dry Needling, Patient/Family education, Balance training, Stair training, Taping, Joint mobilization, Spinal mobilization, Vestibular training, Visual/preceptual remediation/compensation, DME instructions, Cryotherapy, and Moist heat  PLAN FOR NEXT SESSION: assess BP, elliptical (level 2), higher level balance, stairs, 8 step up/downs, FGA impairments (see sticky), blaze pods quadruped?, half kneel, tall kneel   Waddell Southgate, PT Waddell Southgate, PT, DPT, CSRS  09/20/2023, 4:23 PM

## 2023-09-23 ENCOUNTER — Encounter: Payer: Self-pay | Admitting: Cardiology

## 2023-09-23 ENCOUNTER — Ambulatory Visit: Payer: Self-pay | Admitting: Occupational Therapy

## 2023-09-23 ENCOUNTER — Ambulatory Visit: Payer: Self-pay | Admitting: Physical Therapy

## 2023-09-23 VITALS — BP 115/66 | HR 70

## 2023-09-23 DIAGNOSIS — R2689 Other abnormalities of gait and mobility: Secondary | ICD-10-CM

## 2023-09-23 DIAGNOSIS — R29818 Other symptoms and signs involving the nervous system: Secondary | ICD-10-CM

## 2023-09-23 DIAGNOSIS — R278 Other lack of coordination: Secondary | ICD-10-CM

## 2023-09-23 DIAGNOSIS — M6281 Muscle weakness (generalized): Secondary | ICD-10-CM | POA: Diagnosis not present

## 2023-09-23 DIAGNOSIS — R2681 Unsteadiness on feet: Secondary | ICD-10-CM

## 2023-09-23 NOTE — Therapy (Signed)
 OUTPATIENT OCCUPATIONAL THERAPY NEURO EVALUATION & DISCHARGE SUMMARY  Patient Name: Tyler Hoffman MRN: [id number] DOB:04/07/1975, 48 y.o., male Today's Date: 09/23/2023  PCP: Avelina Greig BRAVO, MD  REFERRING PROVIDER: Maurice Sharlet RAMAN, PA-C  END OF SESSION:  OT End of Session - 09/23/23 0854     Visit Number 2    Number of Visits 2    Date for OT Re-Evaluation 09/27/23    Authorization Type [employer]    OT Start Time [phone]    OT Stop Time 0948    OT Time Calculation (min) 55 min    Equipment Utilized During Treatment FM objects    Activity Tolerance Patient tolerated treatment well    Behavior During Therapy [facility] for tasks assessed/performed          Past Medical History:  Diagnosis Date   Acute ischemic stroke (HCC)    CAD S/P DES PCI-LAD (08/2020 - in [place]) 07/19/2020   CTA 07/11/2020 (performed in [place]): In response to abnormal functional study. Coronary Calcium  Score 0.3.  LAD and circumflex have separate ostia.  Appears to be occlusion of the LAD after SP1.  LCx gives off 1 major marginal branch with no significant disease.  RCA has diffuse atheroma with mod stenosis prior to Crux.  LVEF Nl Systolic Fxn. ->  Cath = DES PCI prox LAD SubTO, FFR Neg RCA.   Essential hypertension 05/22/2016   History of rosacea    Hyperlipidemia associated with type 2 diabetes mellitus (HCC) 08/22/2006   Qualifier: Diagnosis of  By: Baird FNP, Frieda Kipper    Lacunar infarction (HCC) 05/2016   No residual infarct   Obesity (BMI 30-39.9) 10/16/2013   OSA on CPAP    Uses CPAP   Type II diabetes mellitus with complication (HCC)    [place] CVA &  CAD- PCI LAD   Past Surgical History:  Procedure Laterality Date   CORONARY STENT INTERVENTION  08/31/2020   [place], [place]: DES PCI LAD (subtotal occlusion ~ 7 mm, downstream of SP1); - Unknowns STENT Size/ Brand..   CT CTA CORONARY W/CA SCORE W/CM &/OR WO/CM  07/11/2020   Performed in [place] in response to abnormal functional study: o Coronary Calcium   Score 0.3.  LAD and circumflex have separate ostia.  Appears to be occlusion of the LAD after SP1.  LCx gives off 1 major marginal branch with no significant disease.  RCA has diffuse atheroma with moderate stenosis prior to the crux.  LVEF normal systolic function. ->  Coronary angiography recommended.   LEFT HEART CATH AND CORONARY ANGIOGRAPHY  08/31/2020   [place], [place]): Two-vessel CAD with subtotal occlusion just distal to SP1.  Subcritical stenosis of the right coronary artery (IFR and FFR negative). => LAD PCI performed with good result.  (Right radial access)   nasoplasty  08/11/2003   TRANSTHORACIC ECHOCARDIOGRAM  06/07/2016   With bubble study for CVA evaluation: ([place]) normal LV function.  EF 66 5%.  No RWMA.  GR 1 DD.  No IAS, PFO or ASD noted.  No R-L shunt.  Normal valves.   TRANSTHORACIC ECHOCARDIOGRAM  12/2018   [place], [place]:ormal biventricular function.  EF 65%.  Normal wall motion.  No significant valvular disease.  No pleural effusion.  Normal aorta within the level of portable section.   TRANSTHORACIC ECHOCARDIOGRAM  08/2020   [place], [place]: Normal EF. evidence of hypertensive heart disease with normal biventricular function.  Normal aortic.  Normal valves.   Patient Active Problem List   Diagnosis Date Noted  Left pontine stroke (HCC) 09/11/2023   Bradycardia 09/10/2023   Acute ischemic stroke (HCC) 09/08/2023   Coronary artery disease involving native coronary artery of native heart without angina pectoris 07/19/2020   Lacunar infarction (HCC) 04/27/2016   Hypertension associated with diabetes (HCC) 04/29/2014   Family history of premature CAD 04/29/2014   Obesity (BMI 30-39.9) 10/16/2013   OSA (obstructive sleep apnea) 11/05/2011   Diabetes mellitus with circulatory complication, with long-term current use of insulin  (HCC) 04/27/2009   CONJUNCTIVITIS, ALLERGIC 06/04/2008   Hyperlipidemia associated with type 2 diabetes mellitus (HCC) 08/22/2006     ONSET DATE: 09/13/2023 (referral date)   REFERRING DIAG: I63.9 (ICD-10-CM) - Acute ischemic stroke (HCC)   THERAPY DIAG:  Other lack of coordination  Muscle weakness (generalized)  Other symptoms and signs involving the nervous system  Rationale for Evaluation and Treatment: Rehabilitation  SUBJECTIVE:   SUBJECTIVE STATEMENT: Pt reports he has been working on typing to complete his papers for school and that he is doing well and things are getting faster and faster.  He was even able to cross his thumb over to play a scale on the piano better as well.  Pt reports working on his FM exercises and getting out with his family this weekend ie) to church.  He reports he has not driven yet but has not been told her can not drive.  He had is glasses adjusted and is doing well with adjusting to his progressive lenses.  Pt accompanied by: self  PERTINENT HISTORY: PMH: CAD, lacunar infarcts on plavix, CAD, HTN, DM2   PRECAUTIONS: Other: loop recorder x 2 weeks   WEIGHT BEARING RESTRICTIONS: No  PAIN:  Are you having pain? No  FALLS: Has patient fallen in last 6 months? No  LIVING ENVIRONMENT: Lives with: lives with their family Lives in: House/apartment Normal home - 3 floors (worked on carrying laundry up/down stairs with handrail) Has following equipment at home: None  PLOF: Independent; driving; working; plays piano  PATIENT GOALS: return to prior level of function  OBJECTIVE:  Note: Objective measures were completed at Evaluation unless otherwise noted.  HAND DOMINANCE: Right  ADLs and IADLS: Overall: mostly mod I (increased time)  MOBILITY STATUS: Independent, reports feeling off-balance  ACTIVITY TOLERANCE: Activity tolerance: good to fair  UPPER EXTREMITY ROM:    BUE WNL  HAND FUNCTION: Eval: Grip strength: Right: 68.1 lbs; Left: 68.3 lbs  09/23/23: Grip Strength Left: 63.4, 70.1, 66.3 Average 66.6 lbs Right: 76.2, 78.4, 91.3, 82.2 Average 82.0  lbs  COORDINATION: 9 Hole Peg test: Right: 24 sec; Left: 22 sec  SENSATION: WFL  EDEMA: none reported or observed  MUSCLE TONE: WFL  COGNITION: Overall cognitive status: Within functional limits for tasks assessed  VISION: Subjective report: no change; has new glasses and needs to get them adjusted Baseline vision: Wears glasses all the time and progressive lenses Visual history: none  VISION ASSESSMENT: WFL  PERCEPTION: WFL  PRAXIS: WFL  OBSERVATIONS: Pt ambulates without use of AD. No loss of balance. The pt appears well kept and has glasses donned.  TREATMENT :    - Therapeutic activities completed for duration as noted below including:  Coordination Activities to work on R UE finger ROM, dexterity and isolated movements with demonstration and practice, as well as  cues  to improve technique, digital isolation and ease of performing task.  Tasks included:  Picking up items one at a time until patient got 5+ in their hand and then move item from palm to fingertips to release ie) Finger-to-palm then palm-to-finger translation of small items -- Cookie Rubinstein Dice game to work on picking up individual small objects/dice. The object of the game is to keep the most dice and not have to discard your dice into the trash can during turn taking.  Pt engaged in picking up multiple dice throughout the game ie) to  -sort dice by color (20 to each person) -practicing picking up 1 at a time until pt had 5+ in palm to sort, roll or drop 1 at a time -placing dice in containers - larger opening of can or smaller square for dice in lid  -- Shuffling, Flipping and dealing cards 1 at a time. -- Setup patient to work on OT educated pt on table top play of Golf Solitaire for RUE to address fine motor coordination, scanning and locating of items, processing, and motor planning.  Pt required minimal cues for proper play.     Patient is encouraged to try different activities throughout the day/week including games with his children like Londa (for the dice), card games, Connect 4 as well as basketball etc.  Patient participated in a cognitive-visual memory task requiring recall of visual stimuli combined with physical movement. A 12-block grid containing abstract and simple images (e.g., numbers, word, sun, smiley face and images within different shapes) was presented. Patient was instructed to observe the grid and remember as many items as he could, then walk around the gym to a separate station to reproduce the images in correct locations on a blank grid from memory. Task was repeated for 3 trials with varied images and grid layouts. Pt accurately recalled 2/3 images x 4 repetitions with minor corrections with self correction 3/4 trials.  Physical mobility during transitions was safe and independent. Patient demonstrated appropriate pacing and dynamic balance.   OT placed approximately 16 numbers and letters on wall in front of patient and had patient locate them while engaged in motor task of tossing and catching a scarf for improved scanning and locating of items to promote visual neuro rehabilitation following CVA.  Pt did miss 2 numbers on his first 2 passes and got all af them on his last pass.  Pt reported he would like to work on this at home and was encouraged to do so with a scarf, balloon or even a ball and to work to on finding images high (ie signage when driving), and low (ie animal on the road when driving).  Pt reported he has not been told he cannot drive and is provided driving recommendations to review with MD (OT to send note) and family for careful and safe return to driving considerations.   PATIENT EDUCATION: Education details: Return to driving info Person educated: Patient Education method: Programmer, multimedia, Facilities manager, Verbal cues, and  Handouts Education comprehension: verbalized understanding and verbal cues required  HOME EXERCISE PROGRAM: 09/18/2023: RUE coordination and green putty HEPs 09/23/23: Return to Driving instructions  GOALS:  LONG TERM GOALS: Target date: 09/27/2023  Pt to demonstrate good understanding of RUE HEP for improved coordination and  strength.  Baseline:  Goal status: MET  2.  Pt will report improved abilities using RUE to play piano.  Baseline:  Goal status: MET  ASSESSMENT:  CLINICAL IMPRESSION: Patient is a 48 y.o. male who was seen today for occupational therapy evaluation for CVA. Hx includes CAD, lacunar infarcts on plavix, CAD, HTN, DM2. Patient currently presents slightly below baseline level of functioning demonstrating functional deficits and impairments as noted below. Pt would benefit from skilled OT services in the outpatient setting to work on impairments as noted below to help pt return to PLOF as able.    PERFORMANCE DEFICITS: in functional skills including ADLs, IADLs, coordination, dexterity, strength, Fine motor control, mobility, balance, endurance, decreased knowledge of precautions, and UE functional use.   IMPAIRMENTS: are limiting patient from ADLs, IADLs, work, leisure, and social participation.   CO-MORBIDITIES: may have co-morbidities  that affects occupational performance. Patient will benefit from skilled OT to address above impairments and improve overall function.  REHAB POTENTIAL: Good  PLAN:  OCCUPATIONAL THERAPY DISCHARGE SUMMARY  Visits from Start of Care: 2  Current functional level related to goals / functional outcomes: Pt has met all goals to satisfactory levels and is pleased with outcomes.   Remaining deficits: Pt has no more significant UE, visual or cognitive functional deficits or pain.   Education / Equipment: Pt has all needed materials and education. Pt understands how to continue on with self-management. See tx notes for more details.    Patient agrees to discharge due to max benefits received from outpatient occupational therapy at this time.     Clarita LITTIE Pride, OT 09/23/2023, 10:31 AM

## 2023-09-23 NOTE — Therapy (Signed)
 OUTPATIENT PHYSICAL THERAPY NEURO TREATMENT   Patient Name: Tyler Hoffman MRN: [id number] DOB:1975/10/11, 48 y.o., male Today's Date: 09/23/2023   PCP: Avelina Greig BRAVO, MD REFERRING PROVIDER: Maurice Sharlet RAMAN, PA-C  END OF SESSION:  PT End of Session - 09/23/23 0801     Visit Number 3    Number of Visits 5   with eval   Date for PT Re-Evaluation 10/16/23    Authorization Type [employer]    PT Start Time 0800    PT Stop Time 0845    PT Time Calculation (min) 45 min    Equipment Utilized During Treatment Gait belt    Activity Tolerance Patient tolerated treatment well    Behavior During Therapy WFL for tasks assessed/performed            Past Medical History:  Diagnosis Date   Acute ischemic stroke (HCC)    CAD S/P DES PCI-LAD (08/2020 - in [place]) 07/19/2020   CTA 07/11/2020 (performed in [place]): In response to abnormal functional study. Coronary Calcium  Score 0.3.  LAD and circumflex have separate ostia.  Appears to be occlusion of the LAD after SP1.  LCx gives off 1 major marginal branch with no significant disease.  RCA has diffuse atheroma with mod stenosis prior to Crux.  LVEF Nl Systolic Fxn. ->  Cath = DES PCI prox LAD SubTO, FFR Neg RCA.   Essential hypertension 05/22/2016   History of rosacea    Hyperlipidemia associated with type 2 diabetes mellitus (HCC) 08/22/2006   Qualifier: Diagnosis of  By: Baird FNP, Frieda Kipper    Lacunar infarction (HCC) 05/2016   No residual infarct   Obesity (BMI 30-39.9) 10/16/2013   OSA on CPAP    Uses CPAP   Type II diabetes mellitus with complication (HCC)    [place] CVA &  CAD- PCI LAD   Past Surgical History:  Procedure Laterality Date   CORONARY STENT INTERVENTION  08/31/2020   [place], [place]: DES PCI LAD (subtotal occlusion ~ 7 mm, downstream of SP1); - Unknowns STENT Size/ Brand..   CT CTA CORONARY W/CA SCORE W/CM &/OR WO/CM  07/11/2020   Performed in [place] in response to abnormal functional study: o Coronary Calcium  Score  0.3.  LAD and circumflex have separate ostia.  Appears to be occlusion of the LAD after SP1.  LCx gives off 1 major marginal branch with no significant disease.  RCA has diffuse atheroma with moderate stenosis prior to the crux.  LVEF normal systolic function. ->  Coronary angiography recommended.   LEFT HEART CATH AND CORONARY ANGIOGRAPHY  08/31/2020   [place], [place]): Two-vessel CAD with subtotal occlusion just distal to SP1.  Subcritical stenosis of the right coronary artery (IFR and FFR negative). => LAD PCI performed with good result.  (Right radial access)   nasoplasty  08/11/2003   TRANSTHORACIC ECHOCARDIOGRAM  06/07/2016   With bubble study for CVA evaluation: ([place]) normal LV function.  EF 66 5%.  No RWMA.  GR 1 DD.  No IAS, PFO or ASD noted.  No R-L shunt.  Normal valves.   TRANSTHORACIC ECHOCARDIOGRAM  12/2018   [place], [place]:ormal biventricular function.  EF 65%.  Normal wall motion.  No significant valvular disease.  No pleural effusion.  Normal aorta within the level of portable section.   TRANSTHORACIC ECHOCARDIOGRAM  08/2020   [place], [place]: Normal EF. evidence of hypertensive heart disease with normal biventricular function.  Normal aortic.  Normal valves.   Patient Active Problem List   Diagnosis  Date Noted   Left pontine stroke (HCC) 09/11/2023   Bradycardia 09/10/2023   Acute ischemic stroke (HCC) 09/08/2023   Coronary artery disease involving native coronary artery of native heart without angina pectoris 07/19/2020   Lacunar infarction (HCC) 04/27/2016   Hypertension associated with diabetes (HCC) 04/29/2014   Family history of premature CAD 04/29/2014   Obesity (BMI 30-39.9) 10/16/2013   OSA (obstructive sleep apnea) 11/05/2011   Diabetes mellitus with circulatory complication, with long-term current use of insulin  (HCC) 04/27/2009   CONJUNCTIVITIS, ALLERGIC 06/04/2008   Hyperlipidemia associated with type 2 diabetes mellitus (HCC) 08/22/2006    ONSET  DATE: 09/13/2023 (referral date)  REFERRING DIAG: I63.9 (ICD-10-CM) - Acute ischemic stroke (HCC)  THERAPY DIAG:  Muscle weakness (generalized)  Other abnormalities of gait and mobility  Unsteadiness on feet  Rationale for Evaluation and Treatment: Rehabilitation  SUBJECTIVE:                                                                                                                                                                                             SUBJECTIVE STATEMENT:  Pt was tired after last visit but felt good overall. The pressure in his head remains gone. He was able to finish his graduate class online over the weekend. Feels like it takes a while to warm up in the morning like starting an old car.  From Eval: Pt reports that ever since his stroke things get better every day. He reports that the hardest thing for him is going down stairs, still feels like he needs to hold onto a handrail and doesn't feel as steady. He is also interested in seeing if he would be safe to use an elliptical. A week ago while still on inpatient rehab he needed a walker just to get the bathroom, not currently using any device. He also feels like his fine motor control has improved. When he is fatigued he does have to think about his movements a little bit more.  Prior to his stroke he was a very fast walker, feels like he is not able to walk as fast now and doesn't feel safe walking at his normal speed. He does feel like his gait is normal though. He went to the grocery store the other day and it was taxing but overall the stimulus level was tolerable.  Pt feeling some pressure in his head that started a few days ago, not a headache but similar to sinus pressure. Pt denies any visual changes since his stroke.  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 Pt is  going to see if he can work half days from home until early August. Pt did not exercise  prior to his stroke, would get 10-12,000 steps/day walking around the school.  Pt accompanied by: self, not driving yet  PERTINENT HISTORY: PMH: CAD, lacunar infarcts on plavix, CAD, HTN, DM2  PAIN:  Are you having pain? No  PRECAUTIONS: Other: loop recorder x 2 weeks  RED FLAGS: None   WEIGHT BEARING RESTRICTIONS: No  FALLS: Has patient fallen in last 6 months? No  LIVING ENVIRONMENT: Lives with: lives with their family Lives in: House/apartment Stairs: Yes: Internal: 12 x 3 steps; on right going up Has following equipment at home: None  PLOF: Independent  PATIENT GOALS: to be able to walk down steps better, be confident knowing I can get on exercise machines - elliptical  OBJECTIVE:  Note: Objective measures were completed at Evaluation unless otherwise noted.  DIAGNOSTIC FINDINGS:   Brain MRI 09/09/2023 IMPRESSION: 9 mm focus of acute infarct in the left paramedian pons.   Mild chronic microvascular ischemic changes.   Remote lacunar infarcts in the bilateral basal ganglia.  COGNITION: Overall cognitive status: Within functional limits for tasks assessed   SENSATION: No N/T in hands or feet Light touch WFL  COORDINATION: Slightly slower on R side  EDEMA:  None  POSTURE: No Significant postural limitations  LOWER EXTREMITY ROM:   WFL  Active  Right Eval Left Eval  Hip flexion    Hip extension    Hip abduction    Hip adduction    Hip internal rotation    Hip external rotation    Knee flexion    Knee extension    Ankle dorsiflexion    Ankle plantarflexion    Ankle inversion    Ankle eversion     (Blank rows = not tested)  LOWER EXTREMITY MMT:    MMT Right Eval Left Eval  Hip flexion 5 5  Hip extension    Hip abduction    Hip adduction    Hip internal rotation    Hip external rotation    Knee flexion 5 5  Knee extension 5 5  Ankle dorsiflexion 5 5  Ankle plantarflexion    Ankle inversion    Ankle eversion    (Blank rows =  not tested)  BED MOBILITY:  Independent per pt report  TRANSFERS: Sit to stand: Complete Independence  Assistive device utilized: None     Stand to sit: Complete Independence  Assistive device utilized: None     Chair to chair: Complete Independence  Assistive device utilized: None       RAMP:  Not tested  CURB:  Not tested  STAIRS:  Level of Assistance: Modified independence Stair Negotiation Technique: Alternating Pattern  with Single Rail on Left Number of Stairs: 4  Height of Stairs: 6  Comments: decreased confidence when descending stairs  GAIT: Findings:  Gait pattern: WFL Distance walked: various clinical distances Assistive device utilized: None Level of assistance: Modified independence Comments: no overt gait impairments   FUNCTIONAL TESTS:  TREATMENT DATE:   Self-Care/Home Management Vitals:   09/23/23 0805  BP: 115/66  Pulse: 70  Assessed in sitting in LUE at rest prior to intervention, vitals WNL.  TherEx Elliptical level 2 x 8 min (6.5 min forwards, 1.5 min backwards), RPE 6-7/10, heart rate gets up to 118 bpm at the most. Pt does require a seated rest break following exercise. Pt would be safe to use this gym equipment going forwards, recommended he stick with just going forwards for now.  NMR To work on functional LE strengthening: 5 Blaze pods on random setting for improved functional LE strengthening and dynamic balance.  Performed on 1 minute intervals with 30 rest periods.  Pt requires SBA guarding. Pods placed in a line on the floor while pt engages in lateral sidestepping/bounding. Round 1:  16 hits. Round 2:  21 hits. Round 3:  23 hits. Pods placed 3 in a line on the floor, 2 on mirror to L and R of patient: Round 1:  30 hits. Round 2:  33 hits. Round 3:  35 hits.  To work on proximal hip strengthening in half  kneel position on red mat on floor: 7# weighted dowel volleyball 2 x 15 reps B     PATIENT EDUCATION: Education details: continue HEP, plan for next PT session Person educated: Patient Education method: Medical illustrator Education comprehension: verbalized understanding, returned demonstration, and needs further education  HOME EXERCISE PROGRAM: Verbally added 09/20/23: Resisted monster walks Resisted lateral side-stepping  GOALS: Goals reviewed with patient? Yes  SHORT TERM GOALS=LONG TERM GOALS due to length of POC  LONG TERM GOALS: Target date: 10/16/2023   Pt will be independent with final HEP for improved strength, balance, transfers and gait. Baseline:  Goal status: INITIAL  2.  Pt will improve FGA to 28/30 for decreased fall risk  Baseline: 24/30 (7/9) Goal status: INITIAL  3.  Pt will feel comfortable navigating up/down a flight of stairs with LRAD with no imbalance noted. Baseline: mod I level with mild instability (7/9) Goal status: INITIAL  4.  Pt will trial community-level exercise equipment to determine what would be safe to him to continue to use upon d/c from OPPT. Baseline:  Goal status: INITIAL    ASSESSMENT:  CLINICAL IMPRESSION: Emphasis of skilled PT session on continuing to utilize elliptical for aerobic endurance training as well as working on functional LE strengthening and dynamic balance. Pt challenged by therapy tasks this date, seated rest break in between tasks. Pt feels he is working at  6-7/10 RPE throughout session. He continues to benefit from skilled PT services to work on improving his safety and independence with functional mobility in order to return to his PLOF. Continue POC.    OBJECTIVE IMPAIRMENTS: Abnormal gait, decreased balance, and decreased coordination.   ACTIVITY LIMITATIONS: stairs and elliptical  PARTICIPATION LIMITATIONS: driving, community activity, and occupation  PERSONAL FACTORS: Fitness and 3+  comorbidities:   CAD, lacunar infarcts on plavix, CAD, HTN, DM2are also affecting patient's functional outcome.   REHAB POTENTIAL: Excellent  CLINICAL DECISION MAKING: Stable/uncomplicated  EVALUATION COMPLEXITY: Low  PLAN:  PT FREQUENCY: 4 visits  PT DURATION: 4  sessions  PLANNED INTERVENTIONS: 97164- PT Re-evaluation, 97750- Physical Performance Testing, 97110-Therapeutic exercises, 97530- Therapeutic activity, [id number]- Neuromuscular re-education, 97535- Self Care, 02859- Manual therapy, [phone]- Gait training, [phone]- Electrical stimulation (manual), [phone] (1-2 muscles), [id number] (3+ muscles)- Dry Needling, Patient/Family education, Balance training, Stair training, Taping, Joint mobilization, Spinal mobilization, Vestibular training, Visual/preceptual remediation/compensation, DME instructions, Cryotherapy,  and Moist heat  PLAN FOR NEXT SESSION: assess BP, elliptical (level 2), higher level balance, stairs, 8 step up/downs, FGA impairments (see sticky), blaze pods quadruped?, half kneel, tall kneel; assess LTG + gait speed and d/c   Waddell Southgate, PT Waddell Southgate, PT, DPT, CSRS  09/23/2023, 8:46 AM

## 2023-09-23 NOTE — Patient Instructions (Signed)
 RETURN TO DRIVING PLAN ONLY IF CLEARED BY DOCTOR:   WITH THE SUPERVISION OF A LICENSED DRIVER, PLEASE DRIVE IN AN EMPTY PARKING LOT FOR AT LEAST 2-3 TRIALS TO TEST REACTION TIME, VISION, USE OF EQUIPMENT IN CAR, ETC.   IF SUCCESSFUL WITH THE PARKING LOT DRIVING, PROCEED TO SUPERVISED DRIVING TRIALS IN YOUR NEIGHBORHOOD STREETS AT LOW TRAFFIC TIMES TO TEST OBSERVATION TO TRAFFIC SIGNALS, REACTION TIME, ETC. PLEASE ATTEMPT AT LEAST 2-3 TRIALS IN YOUR NEIGHBORHOOD.   IF NEIGHBORHOOD DRIVING IS SUCCESSFUL, YOU MAY PROCEED TO DRIVING IN BUSIER AREAS IN YOUR COMMUNITY WITH SUPERVISION OF A LICENSED DRIVER. PLEASE ATTEMPT AT LEAST 4-5 TRIALS.  FINALLY, YOU MAY GRADUALLY RETURN TO INTERSTATE AND NIGHTTIME DRIVING WITH SUPERVISION.

## 2023-09-24 ENCOUNTER — Encounter: Payer: Self-pay | Admitting: Family Medicine

## 2023-09-24 ENCOUNTER — Other Ambulatory Visit: Payer: Self-pay | Admitting: *Deleted

## 2023-09-24 ENCOUNTER — Encounter: Payer: Self-pay | Admitting: Cardiology

## 2023-09-24 MED ORDER — REPATHA SURECLICK 140 MG/ML ~~LOC~~ SOAJ: 140.0000 mg | SUBCUTANEOUS | 6 refills | Status: AC

## 2023-09-24 NOTE — Telephone Encounter (Signed)
 2nd attempt to call pt. No answer, LVM for call back.

## 2023-09-24 NOTE — Assessment & Plan Note (Signed)
 Blood pressure well-controlled with bisoprolol  2.5 mg daily. - Continue lisinopril  20 mg as needed (SBP> 140 mmHg,, sustained for 1 hour) - Hold bisoprolol  if bradycardic symptoms occur.

## 2023-09-24 NOTE — Assessment & Plan Note (Signed)
 Recent left paramedian pontine infarct treated with TNK. Discharged on Brilinta . No residual deficits or further strokes. - Continue DAPT (ASA 81 mg/Brilinta  90 mg twice daily) => managed by neurology - Continue rosuvastatin  referral to consider PCSK9 inhibitor or Leqvio - Allowing for mild permissive hypertension, currently on only 2.5 mg bisoprolol  with as needed lisinopril  for elevated blood pressures.   Low threshold to reinitiate low-dose ACE inhibitor. -Echocardiogram with no source identified, currently wearing event monitor to assess for arrhythmia/A-fib.  However with recurrent strokes, would have a low threshold to consider ILR

## 2023-09-24 NOTE — Assessment & Plan Note (Signed)
 No chest pain or pressure.  However he does note exertional dyspnea more than he had.  He does acknowledge that he has been somewhat more sedentary. Echocardiogram normal. Mild aortic valve calcification without stenosis. Now back on DAPT with ASA 81 mg daily/Brilinta  90 mg twice daily. Defer decisions on holding DAPT for procedures or surgeries to Neurology On Crestor  40 midodrine daily, but lipids not at goal ->  referral to lipid clinic to discuss PCSK9 inhibitor or Leqvio (he will check to see what is covered with insurance) Continue bisoprolol  2.5 mg daily; lisinopril  currently on hold until pressures increase.   For now would hold off on restarting lisinopril  given normal EF. Continue Jardiance  and Trulicity   - Monitor for new cardiac symptoms. - Consider follow-up with cardiologist in [place] if symptoms persist. - Consider stress test if exertional symptoms persist after recovery.

## 2023-09-24 NOTE — Telephone Encounter (Signed)
 Pt cld back and was able to relay info from Dr. Rosemarie. Pt stated by Friday the pain had started to subside and he was feeling better. Encouraged him if the same pain starts again to see his PCP. Pt voiced understanding and thanks for calling him back.

## 2023-09-24 NOTE — Assessment & Plan Note (Signed)
 Managed with Jardiance , Tresiba , and Trulicity . Cardiovascular benefits noted. - Continue Jardiance , Tresiba , and Trulicity . => Close monitoring by PCP.

## 2023-09-24 NOTE — Assessment & Plan Note (Signed)
 Beta-blocker dose reduced to 2.5 mg for bradycardia.  Continues to wear event monitor to evaluate for A-fib as source of occult CVA.  Low threshold to consider loop recorder.

## 2023-09-24 NOTE — Assessment & Plan Note (Signed)
 Continue encourage use of CPAP.  Presence of CPAP does raise question of possible A-fib.

## 2023-09-24 NOTE — Telephone Encounter (Signed)
 PA completed on CoverMyMeds. Case Id:[id number];Status:Approved;Review Type:Prior Auth;Coverage Start Date:08/25/2023;Coverage End Date:09/23/2024;

## 2023-09-24 NOTE — Assessment & Plan Note (Signed)
 LDL not at goal despite rosuvastatin .  Goal LDL <55 due to CAD and recent stroke.  Discussed PCSK9 inhibitors or Leqvio. Lifestyle changes initiated. - Consult pharmacy for insurance coverage of Repatha  or Praluent. - Consider Leqvio if PCSK9 inhibitors not covered. - Continue rosuvastatin . - Implement lifestyle changes.

## 2023-09-24 NOTE — Telephone Encounter (Signed)
 Attempted to call Pt regarding Dr. Bucky response to his inquiry. No answer, LVM for call back.

## 2023-09-25 ENCOUNTER — Ambulatory Visit: Payer: Self-pay | Admitting: Physical Therapy

## 2023-09-25 VITALS — BP 119/65 | HR 67

## 2023-09-25 DIAGNOSIS — R2681 Unsteadiness on feet: Secondary | ICD-10-CM

## 2023-09-25 DIAGNOSIS — R2689 Other abnormalities of gait and mobility: Secondary | ICD-10-CM

## 2023-09-25 DIAGNOSIS — M6281 Muscle weakness (generalized): Secondary | ICD-10-CM | POA: Diagnosis not present

## 2023-09-25 NOTE — Therapy (Signed)
 OUTPATIENT PHYSICAL THERAPY NEURO TREATMENT - DISCHARGE NOTE   Patient Name: Tyler Hoffman MRN: [id number] DOB:20-Sep-1975, 48 y.o., male Today's Date: 09/25/2023   PCP: Avelina Greig BRAVO, MD REFERRING PROVIDER: Maurice Sharlet RAMAN, PA-C   PHYSICAL THERAPY DISCHARGE SUMMARY  Visits from Start of Care: 4  Current functional level related to goals / functional outcomes: Independent   Remaining deficits: N/A   Education / Equipment: N/A   Patient agrees to discharge. Patient goals were met. Patient is being discharged due to meeting the stated rehab goals.   END OF SESSION:  PT End of Session - 09/25/23 1446     Visit Number 4    Number of Visits 5   with eval   Date for PT Re-Evaluation 10/16/23    Authorization Type [employer]    PT Start Time 1445    PT Stop Time 1508    PT Time Calculation (min) 23 min    Equipment Utilized During Treatment Gait belt    Activity Tolerance Patient tolerated treatment well    Behavior During Therapy WFL for tasks assessed/performed             Past Medical History:  Diagnosis Date   Acute ischemic stroke (HCC)    CAD S/P DES PCI-LAD (08/2020 - in [place]) 07/19/2020   CTA 07/11/2020 (performed in [place]): In response to abnormal functional study. Coronary Calcium  Score 0.3.  LAD and circumflex have separate ostia.  Appears to be occlusion of the LAD after SP1.  LCx gives off 1 major marginal branch with no significant disease.  RCA has diffuse atheroma with mod stenosis prior to Crux.  LVEF Nl Systolic Fxn. ->  Cath = DES PCI prox LAD SubTO, FFR Neg RCA.   Essential hypertension 05/22/2016   History of rosacea    Hyperlipidemia associated with type 2 diabetes mellitus (HCC) 08/22/2006   Qualifier: Diagnosis of  By: Baird FNP, Frieda Kipper    Lacunar infarction (HCC) 05/2016   No residual infarct   Obesity (BMI 30-39.9) 10/16/2013   OSA on CPAP    Uses CPAP   Type II diabetes mellitus with complication (HCC)    [place] CVA &  CAD- PCI LAD    Past Surgical History:  Procedure Laterality Date   CORONARY STENT INTERVENTION  08/31/2020   [place], [place]: DES PCI LAD (subtotal occlusion ~ 7 mm, downstream of SP1); - Unknowns STENT Size/ Brand..   CT CTA CORONARY W/CA SCORE W/CM &/OR WO/CM  07/11/2020   Performed in [place] in response to abnormal functional study: o Coronary Calcium  Score 0.3.  LAD and circumflex have separate ostia.  Appears to be occlusion of the LAD after SP1.  LCx gives off 1 major marginal branch with no significant disease.  RCA has diffuse atheroma with moderate stenosis prior to the crux.  LVEF normal systolic function. ->  Coronary angiography recommended.   LEFT HEART CATH AND CORONARY ANGIOGRAPHY  08/31/2020   [place], [place]): Two-vessel CAD with subtotal occlusion just distal to SP1.  Subcritical stenosis of the right coronary artery (IFR and FFR negative). => LAD PCI performed with good result.  (Right radial access)   nasoplasty  08/11/2003   TRANSTHORACIC ECHOCARDIOGRAM  06/07/2016   With bubble study for CVA evaluation: ([place]) normal LV function.  EF 66 5%.  No RWMA.  GR 1 DD.  No IAS, PFO or ASD noted.  No R-L shunt.  Normal valves.   TRANSTHORACIC ECHOCARDIOGRAM  12/2018   [place], [place]:ormal biventricular function.  EF 65%.  Normal wall motion.  No significant valvular disease.  No pleural effusion.  Normal aorta within the level of portable section.   TRANSTHORACIC ECHOCARDIOGRAM  08/2020   [place], [place]: Normal EF. evidence of hypertensive heart disease with normal biventricular function.  Normal aortic.  Normal valves.   Patient Active Problem List   Diagnosis Date Noted   Left pontine stroke (HCC) 09/11/2023   Bradycardia 09/10/2023   Acute ischemic stroke (HCC) 09/08/2023   Coronary artery disease involving native coronary artery of native heart without angina pectoris 07/19/2020   Lacunar infarction (HCC) 04/27/2016   Hypertension associated with diabetes (HCC) 04/29/2014    Family history of premature CAD 04/29/2014   Obesity (BMI 30-39.9) 10/16/2013   OSA (obstructive sleep apnea) 11/05/2011   Diabetes mellitus with circulatory complication, with long-term current use of insulin  (HCC) 04/27/2009   CONJUNCTIVITIS, ALLERGIC 06/04/2008   Hyperlipidemia associated with type 2 diabetes mellitus (HCC) 08/22/2006    ONSET DATE: 09/13/2023 (referral date)  REFERRING DIAG: I63.9 (ICD-10-CM) - Acute ischemic stroke (HCC)  THERAPY DIAG:  Muscle weakness (generalized)  Other abnormalities of gait and mobility  Unsteadiness on feet  Rationale for Evaluation and Treatment: Rehabilitation  SUBJECTIVE:                                                                                                                                                                                             SUBJECTIVE STATEMENT:  Pt started a new medication for cholesterol today, otherwise no acute changes since last visit. Pt [place], didn't even think about it or need the handrail. He feels like his walking speed is back to his baseline.  From Eval: Pt reports that ever since his stroke things get better every day. He reports that the hardest thing for him is going down stairs, still feels like he needs to hold onto a handrail and doesn't feel as steady. He is also interested in seeing if he would be safe to use an elliptical. A week ago while still on inpatient rehab he needed a walker just to get the bathroom, not currently using any device. He also feels like his fine motor control has improved. When he is fatigued he does have to think about his movements a little bit more.  Prior to his stroke he was a very fast walker, feels like he is not able to walk as fast now and doesn't feel safe walking at his normal speed. He does feel like his gait is normal though. He went to the grocery  store the other day and it was taxing  but overall the stimulus level was tolerable.  Pt feeling some pressure in his head that started a few days ago, not a headache but similar to sinus pressure. Pt denies any visual changes since his stroke.  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 Pt is going to see if he can work half days from home until early August. Pt did not exercise prior to his stroke, would get 10-12,000 steps/day walking around the school.  Pt accompanied by: self, not driving yet  PERTINENT HISTORY: PMH: CAD, lacunar infarcts on plavix, CAD, HTN, DM2  PAIN:  Are you having pain? No  PRECAUTIONS: Other: loop recorder x 2 weeks  RED FLAGS: None   WEIGHT BEARING RESTRICTIONS: No  FALLS: Has patient fallen in last 6 months? No  LIVING ENVIRONMENT: Lives with: lives with their family Lives in: House/apartment Stairs: Yes: Internal: 12 x 3 steps; on right going up Has following equipment at home: None  PLOF: Independent  PATIENT GOALS: to be able to walk down steps better, be confident knowing I can get on exercise machines - elliptical  OBJECTIVE:  Note: Objective measures were completed at Evaluation unless otherwise noted.  DIAGNOSTIC FINDINGS:   Brain MRI 09/09/2023 IMPRESSION: 9 mm focus of acute infarct in the left paramedian pons.   Mild chronic microvascular ischemic changes.   Remote lacunar infarcts in the bilateral basal ganglia.  COGNITION: Overall cognitive status: Within functional limits for tasks assessed   SENSATION: No N/T in hands or feet Light touch WFL  COORDINATION: Slightly slower on R side  EDEMA:  None  POSTURE: No Significant postural limitations  LOWER EXTREMITY ROM:   WFL  Active  Right Eval Left Eval  Hip flexion    Hip extension    Hip abduction    Hip adduction    Hip internal rotation    Hip external rotation    Knee flexion    Knee extension    Ankle dorsiflexion    Ankle plantarflexion     Ankle inversion    Ankle eversion     (Blank rows = not tested)  LOWER EXTREMITY MMT:    MMT Right Eval Left Eval  Hip flexion 5 5  Hip extension    Hip abduction    Hip adduction    Hip internal rotation    Hip external rotation    Knee flexion 5 5  Knee extension 5 5  Ankle dorsiflexion 5 5  Ankle plantarflexion    Ankle inversion    Ankle eversion    (Blank rows = not tested)  BED MOBILITY:  Independent per pt report  TRANSFERS: Sit to stand: Complete Independence  Assistive device utilized: None     Stand to sit: Complete Independence  Assistive device utilized: None     Chair to chair: Complete Independence  Assistive device utilized: None       RAMP:  Not tested  CURB:  Not tested  STAIRS:  Level of Assistance: Modified independence Stair Negotiation Technique: Alternating Pattern  with Single Rail on Left Number of Stairs: 4  Height of Stairs: 6  Comments: decreased confidence when descending stairs  GAIT: Findings:  Gait pattern: WFL Distance walked: various clinical distances Assistive device utilized: None Level of assistance: Modified independence Comments: no overt gait impairments   FUNCTIONAL TESTS:    [facility] PT Assessment - 09/25/23 1501  Ambulation/Gait   Gait velocity 32.8 ft over 5.96 sec = 5.5 ft/sec      Functional Gait  Assessment   Gait assessed  Yes    Gait Level Surface Walks 20 ft in less than 5.5 sec, no assistive devices, good speed, no evidence for imbalance, normal gait pattern, deviates no more than 6 in outside of the 12 in walkway width.    Change in Gait Speed Able to smoothly change walking speed without loss of balance or gait deviation. Deviate no more than 6 in outside of the 12 in walkway width.    Gait with Horizontal Head Turns Performs head turns smoothly with no change in gait. Deviates no more than 6 in outside 12 in walkway width    Gait with Vertical Head Turns Performs head turns with no change in  gait. Deviates no more than 6 in outside 12 in walkway width.    Gait and Pivot Turn Pivot turns safely within 3 sec and stops quickly with no loss of balance.    Step Over Obstacle Is able to step over 2 stacked shoe boxes taped together (9 in total height) without changing gait speed. No evidence of imbalance.    Gait with Narrow Base of Support Is able to ambulate for 10 steps heel to toe with no staggering.    Gait with Eyes Closed Walks 20 ft, no assistive devices, good speed, no evidence of imbalance, normal gait pattern, deviates no more than 6 in outside 12 in walkway width. Ambulates 20 ft in less than 7 sec.    Ambulating Backwards Walks 20 ft, no assistive devices, good speed, no evidence for imbalance, normal gait    Steps Alternating feet, no rail.    Total Score 30    FGA comment: 30/30                                                                                                                                     TREATMENT DATE:   Self-Care/Home Management Vitals:   09/25/23 1510  BP: 119/65  Pulse: 67   Assessed in sitting in LUE at rest prior to intervention, vitals WNL.   Physical Performance For LTG assessment:  [facility] PT Assessment - 09/25/23 1501       Ambulation/Gait   Gait velocity 32.8 ft over 5.96 sec = 5.5 ft/sec      Functional Gait  Assessment   Gait assessed  Yes    Gait Level Surface Walks 20 ft in less than 5.5 sec, no assistive devices, good speed, no evidence for imbalance, normal gait pattern, deviates no more than 6 in outside of the 12 in walkway width.    Change in Gait Speed Able to smoothly change walking speed without loss of balance or gait deviation. Deviate no more than 6 in outside of the 12 in walkway width.    Gait with Horizontal Head Turns Performs  head turns smoothly with no change in gait. Deviates no more than 6 in outside 12 in walkway width    Gait with Vertical Head Turns Performs head turns with no change in gait. Deviates  no more than 6 in outside 12 in walkway width.    Gait and Pivot Turn Pivot turns safely within 3 sec and stops quickly with no loss of balance.    Step Over Obstacle Is able to step over 2 stacked shoe boxes taped together (9 in total height) without changing gait speed. No evidence of imbalance.    Gait with Narrow Base of Support Is able to ambulate for 10 steps heel to toe with no staggering.    Gait with Eyes Closed Walks 20 ft, no assistive devices, good speed, no evidence of imbalance, normal gait pattern, deviates no more than 6 in outside 12 in walkway width. Ambulates 20 ft in less than 7 sec.    Ambulating Backwards Walks 20 ft, no assistive devices, good speed, no evidence for imbalance, normal gait    Steps Alternating feet, no rail.    Total Score 30    FGA comment: 30/30            PATIENT EDUCATION: Education details: continue HEP, elliptical, and walking daily, edu on diet and exercise for stroke prevention, results of OM and functional implications Person educated: Patient Education method: Explanation and Demonstration Education comprehension: verbalized understanding and returned demonstration  HOME EXERCISE PROGRAM: Verbally added 09/20/23: Resisted monster walks Resisted lateral side-stepping  GOALS: Goals reviewed with patient? Yes  SHORT TERM GOALS=LONG TERM GOALS due to length of POC  LONG TERM GOALS: Target date: 10/16/2023   Pt will be independent with final HEP for improved strength, balance, transfers and gait. Baseline:  Goal status: MET  2.  Pt will improve FGA to 28/30 for decreased fall risk  Baseline: 24/30 (7/9), 30/30 (7/16) Goal status: MET  3.  Pt will feel comfortable navigating up/down a flight of stairs with LRAD with no imbalance noted. Baseline: mod I level with mild instability (7/9), independent per pt report (7/16) Goal status: MET  4.  Pt will trial community-level exercise equipment to determine what would be safe to him to  continue to use upon d/c from [facility]. Baseline: trialed elliptical - safe to use this equipment (7/16) Goal status: MET    ASSESSMENT:  CLINICAL IMPRESSION: Emphasis of skilled PT session on assessing LTG in preparation for d/c from [facility] services. Pt has met 4/4 LTG due to being independent with final HEP, trialing elliptical and being safe to utilize this equipment upon d/c home, improving his score on the FGA to 30/30 as compared to 24/30 at initial eval, and being able to navigate at least a flight of stairs independently. He has made great progress compared to his initial evaluation and is back to his baseline. He plans to continue with community level exercise upon discharge from [facility].    OBJECTIVE IMPAIRMENTS: Abnormal gait, decreased balance, and decreased coordination.   ACTIVITY LIMITATIONS: stairs and elliptical  PARTICIPATION LIMITATIONS: driving, community activity, and occupation  PERSONAL FACTORS: Fitness and 3+ comorbidities:   CAD, lacunar infarcts on plavix, CAD, HTN, DM2are also affecting patient's functional outcome.   REHAB POTENTIAL: Excellent  CLINICAL DECISION MAKING: Stable/uncomplicated  EVALUATION COMPLEXITY: Low  PLAN: discharge from PT   Waddell Southgate, PT Waddell Southgate, PT, DPT, CSRS  09/25/2023, 3:10 PM

## 2023-09-26 ENCOUNTER — Ambulatory Visit: Admitting: Cardiology

## 2023-10-10 ENCOUNTER — Other Ambulatory Visit: Payer: Self-pay | Admitting: Family Medicine

## 2023-10-14 ENCOUNTER — Encounter: Payer: Self-pay | Admitting: Family Medicine

## 2023-10-14 NOTE — Addendum Note (Signed)
 Encounter addended by: Malvina Pina A on: 10/14/2023 9:01 AM  Actions taken: Imaging Exam ended

## 2023-12-14 ENCOUNTER — Other Ambulatory Visit: Payer: Self-pay | Admitting: Family Medicine

## 2023-12-17 ENCOUNTER — Telehealth: Payer: Self-pay

## 2023-12-17 NOTE — Patient Outreach (Signed)
 First telephone outreach attempt to obtain mRS. No answer. Left message for returned call.  [facility]  Population Health Care Management Assistant  Direct Dial: [phone]  Fax: [phone] Website: [email or web address]

## 2023-12-23 ENCOUNTER — Other Ambulatory Visit: Payer: Self-pay | Admitting: Family Medicine

## 2023-12-24 ENCOUNTER — Telehealth: Payer: Self-pay

## 2023-12-24 NOTE — Patient Outreach (Signed)
 Telephone outreach to patient to obtain mRS was successfully completed. MRS= 0  Shereen Gin [facility] VBCI Assistant Direct Dial: [phone]  Fax: [phone] Website: [email or web address]

## 2023-12-26 ENCOUNTER — Ambulatory Visit: Admitting: Neurology

## 2024-02-10 DIAGNOSIS — I639 Cerebral infarction, unspecified: Secondary | ICD-10-CM

## 2024-02-11 ENCOUNTER — Ambulatory Visit: Payer: Self-pay | Admitting: Family Medicine

## 2024-03-19 LAB — HEMOGLOBIN A1C: Hemoglobin A1C: 6.1

## 2024-03-19 LAB — LIPID PANEL: LDL Cholesterol: 19

## 2024-04-11 ENCOUNTER — Encounter: Payer: Self-pay | Admitting: Family Medicine

## 2024-04-11 ENCOUNTER — Encounter: Payer: Self-pay | Admitting: Cardiology

## 2024-04-15 ENCOUNTER — Encounter: Payer: Self-pay | Admitting: Family Medicine

## 2024-09-03 ENCOUNTER — Ambulatory Visit: Admitting: Neurology
